# Patient Record
Sex: Female | Born: 1975 | Race: Black or African American | Hispanic: No | Marital: Married | Smoking: Current every day smoker
Health system: Southern US, Community
[De-identification: ages and names within clinical notes are randomized; demographics above are authoritative.]

## PROBLEM LIST (undated history)

## (undated) DIAGNOSIS — M797 Fibromyalgia: Secondary | ICD-10-CM

## (undated) DIAGNOSIS — F319 Bipolar disorder, unspecified: Secondary | ICD-10-CM

## (undated) DIAGNOSIS — M5430 Sciatica, unspecified side: Secondary | ICD-10-CM

## (undated) DIAGNOSIS — Z8739 Personal history of other diseases of the musculoskeletal system and connective tissue: Secondary | ICD-10-CM

## (undated) DIAGNOSIS — M1990 Unspecified osteoarthritis, unspecified site: Secondary | ICD-10-CM

## (undated) DIAGNOSIS — K219 Gastro-esophageal reflux disease without esophagitis: Secondary | ICD-10-CM

## (undated) DIAGNOSIS — D649 Anemia, unspecified: Secondary | ICD-10-CM

## (undated) DIAGNOSIS — I10 Essential (primary) hypertension: Secondary | ICD-10-CM

## (undated) HISTORY — PX: TUBAL LIGATION: SHX77

## (undated) HISTORY — PX: ANKLE SURGERY: SHX546

## (undated) HISTORY — DX: Sciatica, unspecified side: M54.30

## (undated) HISTORY — PX: BACK SURGERY: SHX140

---
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
## 1996-01-22 HISTORY — PX: CHOLECYSTECTOMY: SHX55

## 2000-05-29 ENCOUNTER — Emergency Department (HOSPITAL_COMMUNITY): Admission: EM | Admit: 2000-05-29 | Discharge: 2000-05-29 | Payer: Self-pay

## 2000-06-29 ENCOUNTER — Emergency Department (HOSPITAL_COMMUNITY): Admission: EM | Admit: 2000-06-29 | Discharge: 2000-06-29 | Payer: Self-pay | Admitting: *Deleted

## 2000-10-29 ENCOUNTER — Emergency Department (HOSPITAL_COMMUNITY): Admission: EM | Admit: 2000-10-29 | Discharge: 2000-10-29 | Payer: Self-pay | Admitting: Emergency Medicine

## 2001-04-19 ENCOUNTER — Emergency Department (HOSPITAL_COMMUNITY): Admission: EM | Admit: 2001-04-19 | Discharge: 2001-04-19 | Payer: Self-pay | Admitting: Emergency Medicine

## 2001-04-27 ENCOUNTER — Emergency Department (HOSPITAL_COMMUNITY): Admission: EM | Admit: 2001-04-27 | Discharge: 2001-04-27 | Payer: Self-pay | Admitting: Emergency Medicine

## 2001-04-27 ENCOUNTER — Encounter: Payer: Self-pay | Admitting: Emergency Medicine

## 2001-04-28 ENCOUNTER — Ambulatory Visit (HOSPITAL_COMMUNITY): Admission: RE | Admit: 2001-04-28 | Discharge: 2001-04-28 | Payer: Self-pay | Admitting: General Surgery

## 2001-04-28 ENCOUNTER — Encounter: Payer: Self-pay | Admitting: Emergency Medicine

## 2001-04-30 ENCOUNTER — Emergency Department (HOSPITAL_COMMUNITY): Admission: EM | Admit: 2001-04-30 | Discharge: 2001-04-30 | Payer: Self-pay | Admitting: Emergency Medicine

## 2001-05-02 ENCOUNTER — Emergency Department (HOSPITAL_COMMUNITY): Admission: EM | Admit: 2001-05-02 | Discharge: 2001-05-02 | Payer: Self-pay | Admitting: Emergency Medicine

## 2001-05-02 ENCOUNTER — Encounter: Payer: Self-pay | Admitting: Emergency Medicine

## 2001-05-06 ENCOUNTER — Emergency Department (HOSPITAL_COMMUNITY): Admission: EM | Admit: 2001-05-06 | Discharge: 2001-05-06 | Payer: Self-pay | Admitting: Emergency Medicine

## 2001-05-08 ENCOUNTER — Encounter: Payer: Self-pay | Admitting: Emergency Medicine

## 2001-05-08 ENCOUNTER — Emergency Department (HOSPITAL_COMMUNITY): Admission: EM | Admit: 2001-05-08 | Discharge: 2001-05-08 | Payer: Self-pay | Admitting: Emergency Medicine

## 2001-09-22 ENCOUNTER — Emergency Department (HOSPITAL_COMMUNITY): Admission: EM | Admit: 2001-09-22 | Discharge: 2001-09-22 | Payer: Self-pay | Admitting: *Deleted

## 2002-03-28 ENCOUNTER — Encounter: Payer: Self-pay | Admitting: *Deleted

## 2002-03-28 ENCOUNTER — Emergency Department (HOSPITAL_COMMUNITY): Admission: EM | Admit: 2002-03-28 | Discharge: 2002-03-28 | Payer: Self-pay | Admitting: Emergency Medicine

## 2002-05-14 ENCOUNTER — Emergency Department (HOSPITAL_COMMUNITY): Admission: EM | Admit: 2002-05-14 | Discharge: 2002-05-14 | Payer: Self-pay | Admitting: Emergency Medicine

## 2002-05-27 ENCOUNTER — Encounter (HOSPITAL_COMMUNITY): Admission: RE | Admit: 2002-05-27 | Discharge: 2002-06-26 | Payer: Self-pay | Admitting: Orthopedic Surgery

## 2002-10-12 ENCOUNTER — Encounter: Admission: RE | Admit: 2002-10-12 | Discharge: 2002-10-12 | Payer: Self-pay | Admitting: Internal Medicine

## 2002-12-09 ENCOUNTER — Emergency Department (HOSPITAL_COMMUNITY): Admission: EM | Admit: 2002-12-09 | Discharge: 2002-12-09 | Payer: Self-pay | Admitting: Emergency Medicine

## 2003-01-07 ENCOUNTER — Encounter: Admission: RE | Admit: 2003-01-07 | Discharge: 2003-01-07 | Payer: Self-pay | Admitting: Internal Medicine

## 2003-01-22 HISTORY — PX: ERCP: SHX60

## 2003-01-29 ENCOUNTER — Emergency Department (HOSPITAL_COMMUNITY): Admission: EM | Admit: 2003-01-29 | Discharge: 2003-01-29 | Payer: Self-pay | Admitting: Emergency Medicine

## 2003-02-11 ENCOUNTER — Emergency Department (HOSPITAL_COMMUNITY): Admission: EM | Admit: 2003-02-11 | Discharge: 2003-02-11 | Payer: Self-pay | Admitting: Emergency Medicine

## 2003-02-12 ENCOUNTER — Emergency Department (HOSPITAL_COMMUNITY): Admission: EM | Admit: 2003-02-12 | Discharge: 2003-02-12 | Payer: Self-pay | Admitting: Emergency Medicine

## 2003-02-13 ENCOUNTER — Observation Stay (HOSPITAL_COMMUNITY): Admission: EM | Admit: 2003-02-13 | Discharge: 2003-02-13 | Payer: Self-pay | Admitting: Emergency Medicine

## 2003-02-15 ENCOUNTER — Encounter: Admission: RE | Admit: 2003-02-15 | Discharge: 2003-02-15 | Payer: Self-pay | Admitting: Internal Medicine

## 2003-02-18 ENCOUNTER — Encounter: Admission: RE | Admit: 2003-02-18 | Discharge: 2003-02-18 | Payer: Self-pay | Admitting: Internal Medicine

## 2003-03-01 ENCOUNTER — Encounter: Admission: RE | Admit: 2003-03-01 | Discharge: 2003-03-01 | Payer: Self-pay | Admitting: Internal Medicine

## 2003-03-09 ENCOUNTER — Emergency Department (HOSPITAL_COMMUNITY): Admission: EM | Admit: 2003-03-09 | Discharge: 2003-03-09 | Payer: Self-pay | Admitting: Emergency Medicine

## 2003-03-11 ENCOUNTER — Emergency Department (HOSPITAL_COMMUNITY): Admission: EM | Admit: 2003-03-11 | Discharge: 2003-03-11 | Payer: Self-pay | Admitting: Emergency Medicine

## 2003-03-11 ENCOUNTER — Inpatient Hospital Stay (HOSPITAL_COMMUNITY): Admission: EM | Admit: 2003-03-11 | Discharge: 2003-03-13 | Payer: Self-pay | Admitting: Emergency Medicine

## 2003-06-09 ENCOUNTER — Other Ambulatory Visit: Admission: RE | Admit: 2003-06-09 | Discharge: 2003-06-23 | Payer: Self-pay | Admitting: Internal Medicine

## 2003-06-09 ENCOUNTER — Encounter: Admission: RE | Admit: 2003-06-09 | Discharge: 2003-06-09 | Payer: Self-pay | Admitting: Internal Medicine

## 2003-07-09 ENCOUNTER — Emergency Department (HOSPITAL_COMMUNITY): Admission: EM | Admit: 2003-07-09 | Discharge: 2003-07-09 | Payer: Self-pay | Admitting: Family Medicine

## 2003-08-31 ENCOUNTER — Encounter: Admission: RE | Admit: 2003-08-31 | Discharge: 2003-08-31 | Payer: Self-pay | Admitting: Internal Medicine

## 2004-01-13 ENCOUNTER — Emergency Department: Payer: Self-pay | Admitting: Emergency Medicine

## 2004-03-29 ENCOUNTER — Emergency Department: Payer: Self-pay | Admitting: Emergency Medicine

## 2004-04-25 ENCOUNTER — Emergency Department: Payer: Self-pay | Admitting: Emergency Medicine

## 2004-05-17 ENCOUNTER — Inpatient Hospital Stay: Payer: Self-pay | Admitting: Internal Medicine

## 2004-06-29 ENCOUNTER — Emergency Department: Payer: Self-pay | Admitting: General Practice

## 2004-07-05 ENCOUNTER — Emergency Department: Payer: Self-pay | Admitting: Emergency Medicine

## 2004-10-28 ENCOUNTER — Emergency Department: Payer: Self-pay | Admitting: Internal Medicine

## 2005-02-18 ENCOUNTER — Ambulatory Visit: Payer: Self-pay | Admitting: Nurse Practitioner

## 2005-04-01 ENCOUNTER — Emergency Department: Payer: Self-pay | Admitting: Emergency Medicine

## 2005-04-11 ENCOUNTER — Ambulatory Visit: Payer: Self-pay | Admitting: Pain Medicine

## 2005-04-16 ENCOUNTER — Ambulatory Visit: Payer: Self-pay | Admitting: Pain Medicine

## 2005-04-18 ENCOUNTER — Ambulatory Visit: Payer: Self-pay | Admitting: Psychiatry

## 2005-04-19 ENCOUNTER — Inpatient Hospital Stay (HOSPITAL_COMMUNITY): Admission: EM | Admit: 2005-04-19 | Discharge: 2005-04-22 | Payer: Self-pay | Admitting: Psychiatry

## 2005-04-21 ENCOUNTER — Ambulatory Visit: Payer: Self-pay | Admitting: Psychiatry

## 2005-05-02 ENCOUNTER — Ambulatory Visit: Payer: Self-pay | Admitting: Pain Medicine

## 2005-05-12 ENCOUNTER — Emergency Department: Payer: Self-pay | Admitting: Internal Medicine

## 2005-05-13 ENCOUNTER — Emergency Department: Payer: Self-pay | Admitting: Emergency Medicine

## 2005-05-13 ENCOUNTER — Other Ambulatory Visit: Payer: Self-pay

## 2005-05-16 ENCOUNTER — Ambulatory Visit: Payer: Self-pay | Admitting: Pain Medicine

## 2005-05-20 ENCOUNTER — Emergency Department: Payer: Self-pay | Admitting: Unknown Physician Specialty

## 2005-06-02 ENCOUNTER — Emergency Department: Payer: Self-pay | Admitting: Emergency Medicine

## 2005-06-05 ENCOUNTER — Emergency Department: Payer: Self-pay | Admitting: Emergency Medicine

## 2005-09-03 ENCOUNTER — Emergency Department: Payer: Self-pay | Admitting: Emergency Medicine

## 2005-09-04 ENCOUNTER — Emergency Department: Payer: Self-pay | Admitting: Emergency Medicine

## 2005-09-05 ENCOUNTER — Inpatient Hospital Stay (HOSPITAL_COMMUNITY): Admission: EM | Admit: 2005-09-05 | Discharge: 2005-09-09 | Payer: Self-pay | Admitting: *Deleted

## 2005-09-05 ENCOUNTER — Ambulatory Visit: Payer: Self-pay | Admitting: *Deleted

## 2005-11-05 ENCOUNTER — Emergency Department: Payer: Self-pay | Admitting: General Practice

## 2006-01-17 ENCOUNTER — Emergency Department: Payer: Self-pay | Admitting: Emergency Medicine

## 2006-01-19 ENCOUNTER — Other Ambulatory Visit: Payer: Self-pay

## 2006-01-20 ENCOUNTER — Inpatient Hospital Stay: Payer: Self-pay | Admitting: Internal Medicine

## 2006-01-21 ENCOUNTER — Inpatient Hospital Stay: Payer: Self-pay | Admitting: Unknown Physician Specialty

## 2006-01-29 ENCOUNTER — Emergency Department: Payer: Self-pay | Admitting: Unknown Physician Specialty

## 2006-05-27 ENCOUNTER — Emergency Department: Payer: Self-pay | Admitting: Emergency Medicine

## 2006-07-30 ENCOUNTER — Emergency Department: Payer: Self-pay | Admitting: Emergency Medicine

## 2006-10-03 ENCOUNTER — Emergency Department: Payer: Self-pay | Admitting: Emergency Medicine

## 2006-10-03 ENCOUNTER — Other Ambulatory Visit: Payer: Self-pay

## 2006-10-06 ENCOUNTER — Emergency Department: Payer: Self-pay | Admitting: Emergency Medicine

## 2007-03-17 ENCOUNTER — Emergency Department (HOSPITAL_COMMUNITY): Admission: EM | Admit: 2007-03-17 | Discharge: 2007-03-17 | Payer: Self-pay | Admitting: Emergency Medicine

## 2007-05-03 ENCOUNTER — Emergency Department: Payer: Self-pay | Admitting: Internal Medicine

## 2007-06-01 ENCOUNTER — Ambulatory Visit: Payer: Self-pay | Admitting: Gastroenterology

## 2007-07-21 ENCOUNTER — Emergency Department: Payer: Self-pay | Admitting: Internal Medicine

## 2007-12-29 ENCOUNTER — Emergency Department: Payer: Self-pay | Admitting: Internal Medicine

## 2008-01-18 ENCOUNTER — Emergency Department (HOSPITAL_COMMUNITY): Admission: EM | Admit: 2008-01-18 | Discharge: 2008-01-18 | Payer: Self-pay | Admitting: Emergency Medicine

## 2008-02-25 ENCOUNTER — Ambulatory Visit: Payer: Self-pay | Admitting: Unknown Physician Specialty

## 2008-03-04 ENCOUNTER — Ambulatory Visit: Payer: Self-pay | Admitting: Unknown Physician Specialty

## 2008-03-08 ENCOUNTER — Inpatient Hospital Stay: Payer: Self-pay | Admitting: Unknown Physician Specialty

## 2008-07-02 ENCOUNTER — Emergency Department: Payer: Self-pay | Admitting: Emergency Medicine

## 2008-07-04 ENCOUNTER — Emergency Department: Payer: Self-pay | Admitting: Emergency Medicine

## 2008-07-21 ENCOUNTER — Inpatient Hospital Stay: Payer: Self-pay | Admitting: Psychiatry

## 2008-08-14 ENCOUNTER — Emergency Department: Payer: Self-pay

## 2008-08-26 ENCOUNTER — Inpatient Hospital Stay: Payer: Self-pay | Admitting: Psychiatry

## 2008-09-05 ENCOUNTER — Inpatient Hospital Stay: Payer: Self-pay | Admitting: Unknown Physician Specialty

## 2009-06-03 ENCOUNTER — Inpatient Hospital Stay: Payer: Self-pay | Admitting: Psychiatry

## 2010-03-14 ENCOUNTER — Emergency Department: Payer: Self-pay | Admitting: Emergency Medicine

## 2010-03-16 ENCOUNTER — Inpatient Hospital Stay: Payer: Self-pay | Admitting: Psychiatry

## 2010-04-08 ENCOUNTER — Inpatient Hospital Stay: Payer: Self-pay | Admitting: Unknown Physician Specialty

## 2010-06-08 NOTE — Discharge Summary (Signed)
[name], [name]               ACCOUNT NO.:  [id number]   MEDICAL RECORD NO.:  [id number]          PATIENT TYPE:  IPS   LOCATION:  0307                          FACILITY:  [facility]   PHYSICIAN:  Anselm Jungling, MD  DATE OF BIRTH:  05/16/1975   DATE OF ADMISSION:  04/19/2005  DATE OF DISCHARGE:  04/22/2005                                 DISCHARGE SUMMARY   IDENTIFYING DATA AND REASON FOR ADMISSION:  This was the first [facility] admission  for [name] [name], [name] 35 year old African-American [name] admitted for  detoxification of polysubstance dependence.  She had become more and more  depressed due to the consequences of her addiction and presented to an  emergency room requesting hospital admission, reporting need for  detoxification as well as suicidal ideation with [name] plan to overdose.  The  patient had [name] history of chronic back pain due to degenerative disk disease,  and had been taking increasing amounts of narcotic analgesics.  Please refer  to the admission note for further details pertaining to the symptoms,  circumstances and history that led to her hospitalization.  She was given an  initial Axis I diagnosis of polysubstance dependence, substance-induced mood  disorder, and pain disorder.   MEDICAL AND LABORATORY:  The patient was medically and physically assessed  by the psychiatric nurse practitioner at the time of admission.  In addition  to her back pain, she came to [place] with [name] history of GERD and asthma.  She was  prescribed Pepcid 20 mg b.i.d. and albuterol inhaler for these respectively.  She was diagnosed with [name] urinary tract infection during her course of  inpatient treatment and placed on [name] course of Cipro 250 mg b.i.d.   To address pain, she was given lidocaine patches to her lower back daily and  Neurontin 300 mg q.i.d. was initiated to address chronic pain.  Aside from  this, there were no significant medical issues.   HOSPITAL COURSE:  The patient was admitted to the adult  inpatient  psychiatric service.  She presented as [name] moderately obese, but normally  developed healthy-appearing adult [name].  She initially indicated that she  was miserable due to back pain and symptoms of opiate withdrawal.  She was  alert, fully oriented, nonpsychotic, and her thoughts and speech were well  organized.  She did want to continue with the detoxification process in  spite of her physical discomfort.   She was ordered Librium and clonidine withdrawal protocols.  In addition,  she was begun on Effexor XR 75 mg daily to address depression.  Risperdal  0.5 mg q.h.s. was added to her regimen to address sleep and general anxiety.  Ambien 10 mg q.h.s. was also utilized for sleep.   The patient experienced significant opiate withdrawal symptoms over the next  few days, but these improved on [name] daily basis, and she became more positive,  hopeful and optimistic about her future as she felt herself completing this  process.   By the fourth hospital day, the patient indicated that she felt that she was  finished with the detoxification process.  She  was tolerating Effexor well.  She indicated that she looked forward to returning to her life without  dependence on opiate analgesics or other substances.  She discussed with the  undersigned the possibility of locating narcotics anonymous groups in her  home area to begin attending immediately.  She was absent suicidal ideation.  Her mood was significantly brighter at the time of discharge.   AFTERCARE:  The patient was to follow-up with [facility].  Our case manager was to call the patient with follow-up appointments  following her actual discharge.   DISCHARGE MEDICATIONS:  Clonidine 0.1 mg b.i.d., Effexor XR 75 mg daily,  Neurontin 300 mg q.i.d., Risperdal 0.5 mg q.h.s., Ambien 10 mg q.h.s., Cipro  250 mg b.i.d. for 3 days more, Pepcid 20 mg b.i.d., lidocaine patch as daily  to lower back, Nicoderm 21 patch  daily to arm, and albuterol inhaler p.r.n.  wheezing.   DISCHARGE DIAGNOSES:  AXIS I:  1.  Polysubstance dependence, early remission.  2.  Substance-induced mood disorder.  AXIS II:  Deferred.  AXIS III:  History of GERD, urinary tract infection, and chronic lower back  pain.  AXIS IV:  Stressors severe.  AXIS V:  GAF on discharge 65.           ______________________________  Anselm Jungling, MD  Electronically Signed     SPB/MEDQ  D:  04/23/2005  T:  04/24/2005  Job:  [id number]

## 2010-06-08 NOTE — Op Note (Signed)
[name], [name]                       ACCOUNT NO.:  [id number]   MEDICAL RECORD NO.:  [id number]                   PATIENT TYPE:  INP   LOCATION:  A323                                 FACILITY:  [facility]   PHYSICIAN:  Lionel December, M.D.                 DATE OF BIRTH:  Mar 07, 1975   DATE OF PROCEDURE:  03/12/2003  DATE OF DISCHARGE:                                 OPERATIVE REPORT   PROCEDURE:  Endoscopic retrograde cholangiopancreatography with endoscopic  sphincterotomy.   INDICATIONS FOR PROCEDURE:  Anyla is a 35 year old African-American female  with multiple medical problems who has been having intermittent pain across  her upper abdomen with nausea and vomiting. She has been seen in the  emergency room multiple times.  She has also been hospitalized a few weeks  ago thought to have viral illness.  On this visit, she had upper abdominal  ultrasound in the emergency room which revealed mildly dilated bile duct  with defect consistent with stones.  I reviewed the ultrasound and agreed  with this impression. The patient was hospitalized and now undergoing  therapeutic procedure.  The procedure was reviewed with the patient and  informed consent was obtained.   PREOP MEDICATIONS:  Please see anesthesia records.   FINDINGS:  Procedure performed in the OR.  After the patient was placed  under anesthesia and intubated, she was positioned in semiprone position.  The Olympus video duodenoscope was passed through the oropharynx into the  esophagus and stomach.  Examination of the gastric mucosa revealed changes  of gastritis and multiple petechiae.  No ulcer crater was noted. The pyloric  channel was patent, the scope was passed across into the bulb and descending  duodenum.  The ampulla of Vater appeared to be normal.  The CBD was easily  cannulated with RX 44 Autotome and Hydra Jagwire.  Dilute contrast was  injected.  The CBD and CHD were dilated.  It was maximally dilated at  the  level of hepatic duct measuring about 10 mm.  Intrahepatic biliary radicles  were prominent; however, no filling defects were noted. It was difficult to  see the lower most segment of CBD.  Sphincterotomy was performed with  adequate flow of contrast bile.  There was sludge that came out.  Balloon  stone extractor was passed deep into the bile duct to the level of  bifurcation, inflated and trolled through the duct a couple times through a  diameter about 9-10 mm.  I was able to pass this balloon across the  sphincterotomy without any difficulty.  No stones were pulled. The endoscope  was withdrawn.  The patient tolerated the procedure well.   FINAL DIAGNOSES:  1. Gastritis.  2. Normal ampulla of Vater.  3. Dilated common bile duct and common hepatic duct, however, no definite     stones noted.  Sphincterotomy performed with removal of some     sludge/debris.  RECOMMENDATIONS:  Clear liquids today. She will have liver function tests  and amylase in the a.m.  H. pylori serology will be checked.      ___________________________________________                                            Lionel December, M.D.   NR/MEDQ  D:  03/12/2003  T:  03/12/2003  Job:  [id number]
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
## 2010-06-08 NOTE — H&P (Signed)
[name], [name]                       ACCOUNT NO.:  [id number]   MEDICAL RECORD NO.:  [id number]                   PATIENT TYPE:  INP   LOCATION:  A323                                 FACILITY:  [facility]   PHYSICIAN:  Lionel December, M.D.                 DATE OF BIRTH:  [birth year]   DATE OF ADMISSION:  03/11/2003  DATE OF DISCHARGE:                                HISTORY & PHYSICAL   PRESENTING COMPLAINT:  1. Upper abdominal pain, nausea, vomiting.  2. Ultrasound revealed mildly-dilated biliary system with stones.   HISTORY OF PRESENT ILLNESS:  [name] is a 35 year old African American female  whose primary care physician is Dr. _______ in [place] who has been  having intermittent upper abdominal pain with nausea, vomiting, fever, and  profuse diaphoresis.  The patient was in the emergency room two weeks ago,  and last week, three nights in a row.  Her labs appeared to be normal.  She  was experiencing another bout of sever pain across her upper abdomen with  nausea, vomiting and therefore came back to the emergency room.  She was  seen by Dr. Hilario Quarry.  Her lab studies were normal.  Ultrasound was  obtained which showed a common bile duct of 7.5 mm with prominent  intrahepatic system as well as stones in the bile duct.  The patient is  therefore admitted for further management of this problem.  The patient  denies hematemesis, melena or rectal bleeding.  She has had intermittent  diarrhea.  She thought that she had the flu.  She saw the ER physician last  week.  She has not lost any weight recently.   MEDICATIONS:  She is on multiple medications which include Paxil 10 mg  q.h.s., HCTZ.  She is on other antihypertensives, anxiolytics and other  medications which she does not remember the name.  Her boyfriend is supposed  to bring the medications for review.   PAST MEDICAL HISTORY:  1. She has umbilical surgery about 10 years ago at [facility].  She has     purulent  drainage at the time.  She now has noted a recurrence.  She     wonders if her heaving and retching has induced this.  She had surgery on     her left ankle.  She had cholecystectomies seven years ago by Dr. Marlene Bast     at [facility].  She has had tubal ligation.  She has genital herpes.  She has     anxiety, neurosis and depression.  She is under the care of Dr.     ___________ in [place].  2. Obesity.   ALLERGIES:  None Known.   FAMILY HISTORY:  Mother has rheumatoid arthritis and diabetes mellitus, not  doing well.  Father is in good health.  She has one brother who has been in  jail for 10 years.   SOCIAL HISTORY:  She is single.  She has two children, ages 54 and 56.  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  She does not smoke cigarettes or drink alcohol.   PHYSICAL EXAMINATION:  GENERAL:  She is a pleasant, obese, African American  female who is in no acute distress.  VITAL SIGNS:  She weighs 217.5 pounds. She is 5 feet tall.  Pulse 85, blood  pressure 144/95, respirations 20, temperature 99.1.  HEENT:  Conjunctivae is pink.  Sclerae is nonicteric.  Oropharyngeal mucosa  is normal.  NECK:  Without masses or thyromegaly.  CARDIAC:  Regular rhythm, normal S1, S2.  No murmur or gallop noted.  LUNGS:  Clear to auscultation.  ABDOMEN:  Obese.  Bowel sounds are normal.  Umbilicus is inverted. There is  no drainage noted at this time.  Skin appears to be erythematous.  Palpation  reveals soft abdomen, mild tenderness in the mid epigastric area.  No  organomegaly or mass.  RECTAL:  Exam is deferred.  No clubbing or peripheral edema noted.   LABORATORY DATA:  Done in the ER, WBC 9.5, H&H 14.1 and 42.2, platelet count  334,000.  Sodium 138, potassium 3.0, chloride 108, CO2 23.  Glucose 111, BUN  6, creatinine 0.7, calcium 8.8.  Urinalysis reveals a specific gravity of  greater than 1.030.  Her pH is 6.5.  She has protein 30 mg per dl.  Leukocyte  esterase is negative and nitrites are also negative.  Bilirubin is  0.9, AP 61, AST 19, ALT 19.  Total protein 6.6 with albumin of 3.6.   Ultrasound:  I reviewed the ultrasound with Dr. Frazier Richards.  Common bile duct  is mildly dilated, intrahepatic radicals are prominent.  There appear to be  at least two or three stones in the common bile duct.   ASSESSMENT:  1. [name] is a 35 year old African American female with multiple medical     problems who has been experience recurrence of abdominal pain with     nausea, vomiting, diaphoresis, fever, who was found to have     choledocholithiasis on ultrasonography.  Her liver function tests are     normal.  Not mentioned above, her amylase and lipase are also normal at     88 and 25.  She needs to have endoscopic retrograde     cholangiopancreatography, sphincterotomy and stone extraction.  2. Given the patient's underlying problems and the fact that she is on     anxiolytic and antidepressant, I do not feel this would be possible with     conscious sedation.  She will need the help of anesthesia either with     propofol or general anesthesia.  3. Hypokalemia which will be corrected with oral KCl.   PLAN:  1. Start on Unasyn 1.5 grams IV q.6.  2. She will be given KCl p.o. tonight.  3. Protonix 40 mg IV q.24h.  4. She will undergo endoscopic retrograde cholangiopancreatography with     sphincterotomy and stone extraction under general anesthesia or propofol     as determined to be appropriate by anesthesia team.  5. PT, PTT, bleeding time will be checked in the a.m., and she will also be     typed and held for two units.  6. We will resume her usual medications as soon as the specifics are known.  7. I have reviewed ERCP in detail with the patient along with the risk.  She  is agreeable.     ___________________________________________                                        Lionel December, M.D.   NR/MEDQ  D:  03/11/2003  T:  03/11/2003   Job:  [id number]   cc:   Shosry (?), M.D.

## 2010-06-08 NOTE — H&P (Signed)
[name], [name]                       ACCOUNT NO.:  [id number]   MEDICAL RECORD NO.:  [id number]                   PATIENT TYPE:  INP   LOCATION:  A207                                 FACILITY:  [facility]   PHYSICIAN:  Vania Rea, M.D.              DATE OF BIRTH:  1975/12/02   DATE OF ADMISSION:  02/13/2003  DATE OF DISCHARGE:                                HISTORY & PHYSICAL   PRIMARY CARE PHYSICIAN:  __________   CHIEF COMPLAINT:  Nausea, vomiting, diarrhea intractable for three days.   HISTORY OF PRESENT ILLNESS:  This is a 35 year old African American lady  status post cholecystectomy at age 55.  History of gastroesophageal reflux  disease, who has been having vomitus of yellow-green material for the past  three days, unrelieved by p.o. Phenergan, also associated with diarrhea of  similar green material for the past three days.  Today is the patient's  third visit to the emergency room.  The diarrhea is associated with  cramping, colicky, periumbilical pain which then becomes diffuse.  The  patient has had four episodes of vomiting today and three episodes of  diarrhea.  At one point, she was incontinent from the diarrhea.   The patient also suffers from depression, and has not been taking her Paxil.  She is complaining of insomnia.  The patient is in distress because she says  she is all alone in her sickness.  The patient became very upset and wants  to leave the emergency room because she says whether she is here or she is  home, she is not getting any help.   The patient smokes one pack a day for many years.  She denies alcohol use,  but does use marijuana occasionally.  She is concerned it may be the  marijuana use that is causing her symptoms.  No fever, cough, or cold.  No  urinary problems.  No headaches.   The patient gives a history of antibiotic use within the past three months.  She is unclear exactly when, and she is unclear exactly why.   PAST  MEDICAL HISTORY:  1. Status post cholecystectomy at age 45.  2. Bilateral tubal ligation.  3. History of abdominal surgery for periumbilical draining at age 83.  4. Gastroesophageal reflux disease.  5. Depression.  6. Recurrent nausea and vomiting.   MEDICATIONS:  1. ACTZ 25 mg daily.  2. Paxil daily.  3. Phenergan for the past two to three days.  4. The patient has been getting Dilaudid and Zofran in the emergency room.   ALLERGIES:  No known drug allergies.   SOCIAL/FAMILY HISTORY:  She lives alone with two children.  Her mother has  diabetes and hypertension.  She has no peripheral neuropathy or rheumatoid  arthritis.   REVIEW OF SYSTEMS:  Not further contributory.   PHYSICAL EXAMINATION:  GENERAL:  An obese, young African American lady,  sitting on the stretcher, tearful.  Her psychological reaction seems out of  proportion to what is happening around her.  She says the emergency room  people are laughing at her because she has been in three times and do not  seem to be interested in looking after her.  She has been in the emergency  room for seven hours, she says, and her problems are not as important as  others.  VITAL SIGNS:  Initially are 97.2, blood pressure 131/99, pulse 84,  respirations 20, saturating at 100% on room air.  Later when she became  agitated, her pressure went to 160/110.  HEENT:  She is pink.  She is anicteric. Mucous membranes are pale and moist.  In fact, she is tearful.  There is no lymphadenopathy.  There is no jugular  venous distension.  CHEST:  Clear to auscultation bilaterally.  ABDOMEN:  Obese, soft and pliant, but she is tender particular around the  navel.  There is no epigastric tenderness.  CARDIOVASCULAR:  Regular rhythm, no murmurs.  EXTREMITIES:  No edema.  NEUROLOGIC:  CNS:  Depressed appearance, alert and oriented x3, no focal  deficits.   LABORATORY DATA:  On laboratory investigation, her white count is 6.8,  hemoglobin 15.4,  hematocrit 46.6, MCV is 8.1.  RDW 14, platelet count 392,  chest 66% neutrophils, total protein of 7, albumin is 3.9.  AST 19, ALT 18.  Liver function tests are otherwise completely normal.  Sodium 138, potassium  3.6, chloride 106, CO2 25, glucose 105, BUN 7, creatinine 0.7, calcium 9.7.   ASSESSMENT:  1. Intractable nausea and vomiting.  2. Depression.  3. Tobacco abuse.   PLAN:  1. We will admit he for IV fluid resuscitation and a graduated diet starting     with a clear liquid diet.  We probably will not start Cipro.  She does     not have a white count, but we will start Flagyl, and we will work up a     stool for an infectious cause of her diarrhea.  2. In view of the fact that this is a recurrent problem, and that she     defaulted from followup with her gastroenterologist, we will get a GI     consultation also.     ___________________________________________                                         Vania Rea, M.D.   LC/MEDQ  D:  02/13/2003  T:  02/13/2003  Job:  [id number]

## 2010-06-08 NOTE — Discharge Summary (Signed)
[name], [name]                       ACCOUNT NO.:  [id number]   MEDICAL RECORD NO.:  [id number]                   PATIENT TYPE:  INP   LOCATION:  A323                                 FACILITY:  [facility]   PHYSICIAN:  Lionel December, M.D.                 DATE OF BIRTH:  03/24/1975   DATE OF ADMISSION:  03/11/2003  DATE OF DISCHARGE:  03/13/2003                                 DISCHARGE SUMMARY   PRINCIPAL DIAGNOSIS:  1. 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  2. Hyperkalemia.  3. Possible irritable bowel syndrome.  4. Gastroesophageal reflux disease.  5. Anxiety neurosis.  6. Depression.  7. Hypertension.   DISCHARGE MEDICATIONS:  1. Atenolol 25 mg p.o. q.d.  2. Lorazepam 0.5 mg b.i.d.  3. Paxil 20 mg q.d.  4. Dicyclomine 10-20 mg for each meal.  5. Omeprazole 20 mg p.o. q.d.  6. K-Dur 20 mEq q.d.  7. HCTZ 25 mg q.d. with breakfast.  8. Zofran 4 mg p.o. b.i.d.   PRINCIPAL PROCEDURE:  ERCP with sphincterotomy by Dr. Karilyn Cota on March 12, 2003.   HISTORY OF PRESENT ILLNESS:  [name] is a 35 year old African-American female  with multiple medical problems, who came to the emergency room for the  fourth and fifth time on March 11, 2003 with pain across her upper  abdomen with nausea and vomiting.  She was evaluated by Dr. Margarita Grizzle.  Her LFTs, amylase, and lipase were normal.  She had an upper abdominal  ultrasound which revealed mildly dilated bile duct with filling defects  consistent with stones.  She was therefore admitted to my service.  I  reviewed the study with Dr. Frazier Richards and Dr. Pia Mau, and they agreed that  this abnormality was suspicious for small stones.  Patient was therefore  counseled regarding ERCP with sphincterotomy.  She is agreeable.  On  admission, her serum potassium was low and was corrected with oral KCL.  Patient underwent ERCP with sphincterotomy and balloon passage for  bile duct  under general anesthesia.  She has some debris that came out, but there were  no stones.  Post procedure, the patient noted resolution of her pain.  Her  transaminase and alkaline phosphatase, as her last amylase, remained normal.  She was able to tolerate her diet, although she complained of cramps and  nonbloody diarrhea.  She also has a history of GERD.  She had previously  been on Protonix.  It was therefore decided to send her home on a PPI.  Since no stones were found, I felt that she could have sphincter of Oddi  dysfunction.  Her CHD was dilated and felt to be 10 mm, which was not very  big, considering she has had previous cholecystectomy.  The patient also  reported drainage from her umbilicus.  She did not have any discharge when  I  saw her this morning.  She apparently had some surgery 10 years ago at [facility].  I suggested that she have her primary care physician refer to  a surgeon to see whether or not she needs to have debridement or have her  umbilicus, etc., removed.  The patient was feeling much better prior to  discharge.  She was given a prescription for dicyclomine 10 mg #100,  omeprazole 20 mg #30 with two refills, K-Dur 20 mEq 30 with refills, and  Zofran 4 mg 10 doses.  She was asked to follow up with Dr. Prescott Parma.  She is  in [place].  She should have her potassium checked.  She was also given a  note stating that she was in the hospital between February 18 and March 13, 2003.     ___________________________________________                                         Lionel December, M.D.   NR/MEDQ  D:  03/13/2003  T:  03/13/2003  Job:  [id number]   cc:   Dr. Louanne Skye outpatient clinic

## 2010-06-08 NOTE — Consult Note (Signed)
[facility]  Patient:    GAVYN, ZOSS Visit Number: [id number] MRN: [id number]          Service Type: EMS Location: ED Attending Physician:  Donnetta Hutching Dictated by:   Shellia Carwin, M.D. Proc. Date: 04/30/01 Admit Date:  05/08/2001 Discharge Date: 05/08/2001   CC:         Dr. Rosalia Hammers   Consultation Report  REFERRING PHYSICIAN:  Dr. Rosalia Hammers.  HISTORY OF PRESENT ILLNESS:  The patient is a 35 year old black female who has been seen twice in the emergency room this week. Her complaints of nausea and vomiting. The patient has had these symptoms for approximately six months. She has been followed by Dr. Francis Gaines in [place]. She apparently has had an endoscopy last month by Dr. Francis Gaines.  She was told that she had a hiatal hernia. She has been started on twice daily Protonix. She has also been told that she has reflux. The patient reports that since 6 oclock this morning she has had nausea and vomiting of yellow bilious-appearing fluid. She also states that vomiting usually starts right after a meal. She has also reported some diarrhea with soft stools with no blood or mucus in them and no black tarry stools reported. She has had no hematemesis and no coffee-ground emesis. She has had some subjective fever and chills at home. She denies any dysuria or hematuria. She reports perhaps a weight loss of about 10 pounds and she denies any vaginal discharge. She has been recently diagnosed with Trichomonas although states that she has not had any symptoms of this. Her last menstrual period was in the first week of April. The patient has no family history of any type of gastrointestinal problems.  PAST MEDICAL HISTORY:  Cholecystectomy at age 85, BTL, and an unknown type of abdominal surgery at [facility] at the age of 70.  CURRENT MEDICATIONS:  Protonix twice daily.  ALLERGIES:  None.  SOCIAL HISTORY:  The patient smokes one pack tobacco daily. She  denies any alcohol use. She reports some occasional marijuana use but no other recreational drug use is reported and no IV drug use is reported.  FAMILY HISTORY:  Her mother has diabetes, hypertension, asthma, peripheral neuropathy, and rheumatoid arthritis.  REVIEW OF SYSTEMS:  No headaches or visual complaints. Occasional dysphagia. Nausea and vomiting and diarrhea as per HPI. No history of cardiac or pulmonary problems. No urinary symptoms, no leg pain or swelling, and no neurologic symptoms.  The patient was seen in the emergency room on Monday. At that time she had a negative CT scan of the abdomen. She was scheduled for a gallbladder ultrasound which was negative as well.  PHYSICAL EXAMINATION:  VITAL SIGNS:  Temperature 97.3, pulse was 65, respirations of 24, initial blood pressure at the time of admission to the emergency room was 168/99.  HEENT:  Pupils are equal and reactive to light. Sclerae nonicteric, conjunctivae are noninjected. Extraocular movements appear to be intact. Oropharynx mucous membranes appear dry and tongue is coated. There are no oral lesions noted.  NECK:  Supple. There is no JVD or adenopathy noted.  CHEST AND LUNGS:  Clear to auscultation.  CARDIAC:  Rhythm is regular.  BREAST EXAM:  Deferred.  ABDOMEN:  Obese, soft and nontender. Bowel sounds are present in all four quadrants. There is no rebound, guarding, or obvious organomegaly noted.  PELVIC EXAM:  Performed by Dr. Rosalia Hammers did not reveal any discharge. There was some mild  cervical motion tenderness noted and gonorrhea and chlamydia samples were taken at that time.  EXTREMITIES:  Distal pulses are intact. Calves are nontender. No peripheral edema is noted.  NEUROLOGIC:  Grossly nonfocal.  ANCILLARY DATA:  White count is 5.6, hemoglobin 14.6, hematocrit of 43.4, and platelets of 351. A urinalysis is negative. Sodium of 139, potassium 3.5, chloride 109, CO2 of 23, glucose of 112. BUN 8,  creatinine 0.8, calcium of 9.2, total protein of 7.5, albumin of 4.0, AST is 19, ALT of 13, alkaline phosphatase of 59, a total bili of 0.4, lipase of 23, and amylase of 141. The patient did have a negative beta HCG the end of March. A repeat HCG is pending at the time of this dictation.  ASSESSMENT AND PLAN: GASTROENTEROLOGY:  Nausea and vomiting and diarrhea. The patient has had negative gallbladder ultrasound, negative CT scan, and endoscopy approximately one month ago. Suspect that patient does need repeat endoscopy with biopsies if these have not been done previously. Have spoken with patient as to whether she desires admission here to see a gastroenterologist at this institution versus going home and following up with her regular gastroenterologist, Dr. Marlene Bast in [place]. The patient states that she is comfortable right now and that she has an appointment with Dr. Marlene Bast tomorrow and that she would prefer to go home and follow up with him. Therefore, we will discharge her home where she will be given pain medications and antiemetics, enough to get her through until tomorrow, and she will follow up with Dr. Marlene Bast in [place]. Also patient was advised that she should consider discontinuation of tobacco use. Dictated by:   Shellia Carwin, M.D. Attending Physician:  Donnetta Hutching DD:  04/30/01 TD:  05/01/01 Job: [id number]
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
## 2010-06-13 ENCOUNTER — Inpatient Hospital Stay: Payer: Self-pay | Admitting: Psychiatry

## 2010-07-10 ENCOUNTER — Emergency Department: Payer: Self-pay | Admitting: Unknown Physician Specialty

## 2010-10-12 LAB — INFLUENZA A+B VIRUS AG-DIRECT(RAPID)
Inflenza A Ag: NEGATIVE
Influenza B Ag: NEGATIVE

## 2010-10-25 LAB — CBC
Hemoglobin: 13.8 g/dL (ref 12.0–15.0)
MCHC: 33 g/dL (ref 30.0–36.0)
Platelets: 343 10*3/uL (ref 150–400)
RDW: 14.9 % (ref 11.5–15.5)

## 2010-10-25 LAB — COMPREHENSIVE METABOLIC PANEL
ALT: 11 U/L (ref 0–35)
AST: 15 U/L (ref 0–37)
Albumin: 3.8 g/dL (ref 3.5–5.2)
Alkaline Phosphatase: 47 U/L (ref 39–117)
Calcium: 8.9 mg/dL (ref 8.4–10.5)
GFR calc Af Amer: >60 mL/min (ref >60)
Potassium: 3.1 mEq/L — ABNORMAL LOW (ref 3.5–5.1)
Sodium: 136 mEq/L (ref 135–145)
Total Protein: 6.6 g/dL (ref 6.0–8.3)

## 2010-10-25 LAB — DIFFERENTIAL
Basophils Relative: 1 % (ref 0–1)
Eosinophils Absolute: 0 10*3/uL (ref 0.0–0.7)
Eosinophils Relative: 1 % (ref 0–5)
Lymphs Abs: 2.5 10*3/uL (ref 0.7–4.0)
Monocytes Absolute: 0.4 10*3/uL (ref 0.1–1.0)
Monocytes Relative: 6 % (ref 3–12)
Neutrophils Relative %: 58 % (ref 43–77)

## 2011-06-04 ENCOUNTER — Other Ambulatory Visit: Payer: Self-pay

## 2011-06-04 LAB — COMPREHENSIVE METABOLIC PANEL
Alkaline Phosphatase: 68 U/L (ref 50–136)
Bilirubin,Total: 0.3 mg/dL (ref 0.2–1.0)
Calcium, Total: 8.3 mg/dL — ABNORMAL LOW (ref 8.5–10.1)
Creatinine: 0.73 mg/dL (ref 0.60–1.30)
EGFR (African American): >60
EGFR (Non-African Amer.): >60
Glucose: 94 mg/dL (ref 65–99)
Potassium: 4 mmol/L (ref 3.5–5.1)
SGOT(AST): 19 U/L (ref 15–37)
Sodium: 141 mmol/L (ref 136–145)
Total Protein: 6.8 g/dL (ref 6.4–8.2)

## 2011-06-04 LAB — CBC WITH DIFFERENTIAL/PLATELET
Eosinophil %: 2.3 %
HGB: 13.1 g/dL (ref 12.0–16.0)
Lymphocyte #: 2 10*3/uL (ref 1.0–3.6)
Lymphocyte %: 37.3 %
MCHC: 32.7 g/dL (ref 32.0–36.0)
MCV: 79 fL — ABNORMAL LOW (ref 80–100)
Monocyte %: 6.3 %
Platelet: 299 10*3/uL (ref 150–440)
RDW: 17.5 % — ABNORMAL HIGH (ref 11.5–14.5)

## 2011-06-04 LAB — LIPID PANEL
Cholesterol: 151 mg/dL (ref 0–200)
HDL Cholesterol: 46 mg/dL (ref 40–60)
VLDL Cholesterol, Calc: 12 mg/dL (ref 5–40)

## 2011-06-04 LAB — HEMOGLOBIN A1C: Hemoglobin A1C: 6 % (ref 4.2–6.3)

## 2011-08-10 ENCOUNTER — Inpatient Hospital Stay: Payer: Self-pay | Admitting: Psychiatry

## 2011-08-10 LAB — ACETAMINOPHEN LEVEL: Acetaminophen: <2 ug/mL

## 2011-08-10 LAB — DRUG SCREEN, URINE
Barbiturates, Ur Screen: NEGATIVE (ref ?–200)
Benzodiazepine, Ur Scrn: NEGATIVE (ref ?–200)
Cannabinoid 50 Ng, Ur ~~LOC~~: POSITIVE (ref ?–50)
Methadone, Ur Screen: NEGATIVE (ref ?–300)
Opiate, Ur Screen: POSITIVE (ref ?–300)
Phencyclidine (PCP) Ur S: NEGATIVE (ref ?–25)

## 2011-08-10 LAB — URINALYSIS, COMPLETE
Blood: NEGATIVE
Glucose,UR: NEGATIVE mg/dL (ref 0–75)
Leukocyte Esterase: NEGATIVE
Nitrite: NEGATIVE
Ph: 5 (ref 4.5–8.0)
Specific Gravity: 1.033 (ref 1.003–1.030)

## 2011-08-10 LAB — SALICYLATE LEVEL: Salicylates, Serum: 2.5 mg/dL

## 2011-08-10 LAB — CBC
HCT: 42.2 % (ref 35.0–47.0)
HGB: 13.4 g/dL (ref 12.0–16.0)
MCV: 77 fL — ABNORMAL LOW (ref 80–100)
Platelet: 388 10*3/uL (ref 150–440)
RDW: 17.2 % — ABNORMAL HIGH (ref 11.5–14.5)
WBC: 9.6 10*3/uL (ref 3.6–11.0)

## 2011-08-10 LAB — ETHANOL: Ethanol: <3 mg/dL

## 2011-08-10 LAB — COMPREHENSIVE METABOLIC PANEL
Albumin: 3.5 g/dL (ref 3.4–5.0)
BUN: 7 mg/dL (ref 7–18)
Calcium, Total: 8.8 mg/dL (ref 8.5–10.1)
SGOT(AST): 19 U/L (ref 15–37)
SGPT (ALT): 15 U/L
Total Protein: 7.8 g/dL (ref 6.4–8.2)

## 2011-08-10 LAB — TSH: Thyroid Stimulating Horm: 0.29 u[IU]/mL — ABNORMAL LOW

## 2011-08-11 LAB — POTASSIUM: Potassium: 3.4 mmol/L — ABNORMAL LOW (ref 3.5–5.1)

## 2011-08-17 LAB — URINALYSIS, COMPLETE
Bilirubin,UR: NEGATIVE
Hyaline Cast: 27
Ketone: NEGATIVE
Protein: 30
Specific Gravity: 1.023 (ref 1.003–1.030)
Squamous Epithelial: 3
WBC UR: 8 /HPF (ref 0–5)

## 2011-09-14 ENCOUNTER — Inpatient Hospital Stay: Payer: Self-pay | Admitting: Psychiatry

## 2011-09-14 LAB — COMPREHENSIVE METABOLIC PANEL
Albumin: 3.3 g/dL — ABNORMAL LOW (ref 3.4–5.0)
Alkaline Phosphatase: 76 U/L (ref 50–136)
Anion Gap: 5 — ABNORMAL LOW (ref 7–16)
BUN: 8 mg/dL (ref 7–18)
Chloride: 107 mmol/L (ref 98–107)
Creatinine: 0.7 mg/dL (ref 0.60–1.30)
EGFR (African American): >60
Glucose: 106 mg/dL — ABNORMAL HIGH (ref 65–99)
Potassium: 3.3 mmol/L — ABNORMAL LOW (ref 3.5–5.1)
SGOT(AST): 18 U/L (ref 15–37)
SGPT (ALT): 15 U/L (ref 12–78)
Total Protein: 7 g/dL (ref 6.4–8.2)

## 2011-09-14 LAB — DRUG SCREEN, URINE
Amphetamines, Ur Screen: NEGATIVE (ref ?–1000)
Benzodiazepine, Ur Scrn: POSITIVE (ref ?–200)
Cocaine Metabolite,Ur ~~LOC~~: NEGATIVE (ref ?–300)
MDMA (Ecstasy)Ur Screen: NEGATIVE (ref ?–500)
Tricyclic, Ur Screen: NEGATIVE (ref ?–1000)

## 2011-09-14 LAB — URINALYSIS, COMPLETE
Glucose,UR: NEGATIVE mg/dL (ref 0–75)
Ketone: NEGATIVE
Protein: 30
RBC,UR: 3 /HPF (ref 0–5)
WBC UR: 6 /HPF (ref 0–5)

## 2011-09-14 LAB — ETHANOL: Ethanol %: <0.003 % (ref 0.000–0.080)

## 2011-09-14 LAB — CBC
HGB: 12.8 g/dL (ref 12.0–16.0)
MCH: 24.8 pg — ABNORMAL LOW (ref 26.0–34.0)
MCHC: 32.7 g/dL (ref 32.0–36.0)
Platelet: 392 10*3/uL (ref 150–440)
RBC: 5.14 10*6/uL (ref 3.80–5.20)

## 2011-09-17 LAB — LIPID PANEL
Cholesterol: 179 mg/dL (ref 0–200)
Triglycerides: 126 mg/dL (ref 0–200)

## 2011-09-22 LAB — URINALYSIS, COMPLETE
Blood: NEGATIVE
Ketone: NEGATIVE
Ph: 7 (ref 4.5–8.0)
Protein: NEGATIVE
Specific Gravity: 1.02 (ref 1.003–1.030)

## 2011-09-23 LAB — COMPREHENSIVE METABOLIC PANEL
Anion Gap: 8 (ref 7–16)
BUN: 21 mg/dL — ABNORMAL HIGH (ref 7–18)
Co2: 27 mmol/L (ref 21–32)
Creatinine: 0.83 mg/dL (ref 0.60–1.30)
EGFR (African American): >60
SGPT (ALT): 19 U/L (ref 12–78)

## 2011-09-23 LAB — CBC WITH DIFFERENTIAL/PLATELET
Basophil %: 0.2 %
Eosinophil #: 0 10*3/uL (ref 0.0–0.7)
Eosinophil %: 0 %
HCT: 43.5 % (ref 35.0–47.0)
MCHC: 32.7 g/dL (ref 32.0–36.0)
MCV: 76 fL — ABNORMAL LOW (ref 80–100)
Monocyte #: 1 x10 3/mm — ABNORMAL HIGH (ref 0.2–0.9)
Neutrophil #: 7.3 10*3/uL — ABNORMAL HIGH (ref 1.4–6.5)
RDW: 18.1 % — ABNORMAL HIGH (ref 11.5–14.5)

## 2011-09-24 LAB — CARBAMAZEPINE LEVEL, TOTAL: Carbamazepine: 9.1 ug/mL (ref 4.0–12.0)

## 2012-03-05 ENCOUNTER — Emergency Department: Payer: Self-pay | Admitting: Emergency Medicine

## 2012-03-05 LAB — CBC
HCT: 40.9 % (ref 35.0–47.0)
MCHC: 31.9 g/dL — ABNORMAL LOW (ref 32.0–36.0)
Platelet: 376 10*3/uL (ref 150–440)
RDW: 17.6 % — ABNORMAL HIGH (ref 11.5–14.5)

## 2012-03-05 LAB — COMPREHENSIVE METABOLIC PANEL
Albumin: 3.8 g/dL (ref 3.4–5.0)
Alkaline Phosphatase: 90 U/L (ref 50–136)
Anion Gap: 6 — ABNORMAL LOW (ref 7–16)
Bilirubin,Total: 0.5 mg/dL (ref 0.2–1.0)
Calcium, Total: 8.9 mg/dL (ref 8.5–10.1)
Chloride: 110 mmol/L — ABNORMAL HIGH (ref 98–107)
Co2: 25 mmol/L (ref 21–32)
Creatinine: 0.88 mg/dL (ref 0.60–1.30)
EGFR (African American): >60
EGFR (Non-African Amer.): >60
Glucose: 108 mg/dL — ABNORMAL HIGH (ref 65–99)
SGOT(AST): 18 U/L (ref 15–37)
SGPT (ALT): 16 U/L (ref 12–78)
Sodium: 141 mmol/L (ref 136–145)

## 2012-03-05 LAB — URINALYSIS, COMPLETE
Bilirubin,UR: NEGATIVE
Blood: NEGATIVE
Glucose,UR: NEGATIVE mg/dL (ref 0–75)
Ketone: NEGATIVE
Ph: 6 (ref 4.5–8.0)
Protein: 25
Specific Gravity: 1.02 (ref 1.003–1.030)
Squamous Epithelial: 1
WBC UR: 2 /HPF (ref 0–5)

## 2012-03-05 LAB — DRUG SCREEN, URINE
Amphetamines, Ur Screen: NEGATIVE (ref ?–1000)
Cannabinoid 50 Ng, Ur ~~LOC~~: POSITIVE (ref ?–50)
Cocaine Metabolite,Ur ~~LOC~~: POSITIVE (ref ?–300)
MDMA (Ecstasy)Ur Screen: NEGATIVE (ref ?–500)
Methadone, Ur Screen: NEGATIVE (ref ?–300)

## 2012-03-05 LAB — TSH: Thyroid Stimulating Horm: 0.6 u[IU]/mL

## 2012-03-05 LAB — ETHANOL
Ethanol %: <0.003 % (ref 0.000–0.080)
Ethanol: <3 mg/dL

## 2012-10-24 ENCOUNTER — Emergency Department: Payer: Self-pay | Admitting: Emergency Medicine

## 2013-05-02 ENCOUNTER — Emergency Department: Payer: Self-pay | Admitting: Emergency Medicine

## 2013-09-16 ENCOUNTER — Emergency Department: Payer: Self-pay | Admitting: Emergency Medicine

## 2013-09-19 LAB — BETA STREP CULTURE(ARMC)

## 2013-09-26 ENCOUNTER — Emergency Department: Payer: Self-pay | Admitting: Emergency Medicine

## 2014-05-10 NOTE — H&P (Signed)
PATIENT NAME:  [name], [name] MR#:  [id number] DATE OF BIRTH:  12-27-1975  DATE OF ADMISSION:  09/14/2011  IDENTIFYING INFORMATION: The patient is a 39 year old African American female who was discharged on 08/20/2011 after being stabilized at [facility]. The patient comes back for readmission with a chief complaint "I was depressed when I left. I was not completely fixed. I went to see a prophet in [place], [place] to see if a miracle can happen and nothing happened. When I went home my situation was not changed. I came to know my son had 30 days instead of 60 days to serve and they keep him at home and that keeps the trouble going and nothing has changed and I'm still depressed".   HISTORY OF PRESENT ILLNESS: The patient was discharged from [facility] on 08/20/2011 after being an inpatient for eight days. According to information obtained, she was under stress as described above and still continued to have the same stress when she was discharged to go home.   The patient was discharged on the following medications and reports that she has been taking the same: 1. OxyContin ER 40 mg daily. 2. Oxycodone 15 mg twice a day. 3. Omeprazole 40 mg daily. 4. Lisinopril 40 mg daily. 5. HCTZ 50 mg daily. 6. Celexa 20 mg daily.  The patient admits to compliance with the medications.  PAST PSYCHIATRIC HISTORY: She has had inpatient hospitalizations on Psychiatry on many occasions. There were several suicide attempts by cutting self and overdosing. Currently she is under the care of [facility] in the community. Last appointment was on 09/04/2011; next appointment is to be made. She has a three year diagnosis of bipolar disorder and in the past was on Geodon, Celexa, and Remeron.   FAMILY HISTORY OF MENTAL ILLNESS: Son has polysubstance abuse and currently has legal charges.   PAST MEDICAL HISTORY: 1. Degenerative disk disease. 2. Chronic back pain. 3. Gastroesophageal reflux  disease. 4. Hypertension.  ALLERGIES: Allergic to Demerol and Tylenol.   MEDICATIONS: As stated above.   SOCIAL HISTORY: The patient is from [facility]. Graduated from high school. Used to work as a Training and development officer. Has been on disability for mental illness and back problems. At this time  she is married and has two children living with her at home and her son has legal problems. She is a patient of Dr. Sherlynn Stalls at [facility] and Dr. Mare Ferrari in [place] for medical needs. She gets her pain killers from Dr. Mare Ferrari for her chronic back pain.   PHYSICAL EXAMINATION:  VITAL SIGNS: Temperature 97.8, pulse 68 per minute and regular, respirations 18 per minute and regular, blood pressure 130/80 mmHg.  HEENT: Head is normocephalic and atraumatic. Pupils equal, round, and reactive to light and accommodation. Fundi bilaterally benign. EOMS visualised. Tympanic membranes no exudate.  NECK: Supple without any organomegaly, lymphadenopathy, or thyromegaly.  CHEST: Normal expansion. Normal breath sounds heard.   HEART: Normal S1, S2 without any murmurs or gallops.  ABDOMEN: Soft, nontender, and nondistended. Positive bowel sounds.  MUSCULOSKELETAL: Normal muscle strength in all extremities.   SKIN: No rashes. No bruises. No cyanosis. No clubbing.  LYMPHATIC: No cervical lymphadenopathy.  NEUROLOGIC: Gait is normal. Romberg is negative. Cranial nerves II through XII are grossly intact. Deep tendon reflexes 2+ and normal. Plantars are normal response.   MENTAL STATUS EXAMINATION: The patient is dressed in hospital clothes, alert and oriented to place, person, and time. She is upset, irritable, and angry that she was  discharged early before she was fixed and her home situation is the same and she continues to feel depressed, continues to feel hopeless and helpless. She admits that she needs help with the same. Denies suicidal or homicidal ideas or plans and reports "not right now and not at the moment but  I do have wishes because I'm upset and stay upset". No evidence of psychosis. Denies auditory or visual hallucinations. Denies paranoid or suspicious ideas. Denies any thought insertion or thought control. Denies having any grandiose ideas. Thought process logical and goal directed. She reports that she stays depressed because of her living situation which is not changing and she wants more help with the same. Cognition is intact. Recall and memory are good and could remember all three objects in several minutes. She could spell the word world forward and backward. She knows the capital of [place], current Software engineer and previous presidents. Does admit to sleep and appetite disturbance because she stays upset and frustrated all the time at home. Insight and judgment are guarded.   IMPRESSION: AXIS I: 1. Mood disorder, not otherwise specified. 2. Cannabis dependence. 3. History of opiate dependence.  4. THC abuse.   AXIS II: Personality disorder, not otherwise specified.  AXIS III: 1. Degenerative disk disease.  2. Chronic back pain. 3. Gastroesophageal reflux disease. 4. Hypertension.  AXIS IV: Severe. Long history of mental illness with substance abuse, constant family conflicts, financial, occupational, poor primary support, and currently son having legal problems and living situation is not helpful to her feeling better.   AXIS V: 25.   PLAN: The patient is admitted to [facility] for close observation, evaluation, and help. She will be started back on the same medications. During the stay in the hospital she will be given milieu therapy and supportive counseling. She will take part in individual and group therapy where opioid dependence and THC dependence will be addressed. Social Services will look into her living situation and family conference will be held if needed so that her living situation can be addressed and her depression can be  helped.   ____________________________ Wallace Cullens. Franchot Mimes, MD skc:drc D: 09/14/2011 17:10:25 ET T: 09/15/2011 07:48:13 ET JOB#: [id number]  cc: Arlyn Leak K. Franchot Mimes, MD, <Dictator> Dewain Penning MD ELECTRONICALLY SIGNED 09/15/2011 18:17

## 2014-05-10 NOTE — H&P (Signed)
PATIENT NAME:  [name], [name] MR#:  [id number] DATE OF BIRTH:  [birth year]  DATE OF ADMISSION:  08/10/2011  She was admitted on 07/20, assessed on 07/22.   REFERRING PHYSICIAN: Dr. Lenise Arena.   ADMITTING PHYSICIAN: Dr. Camie Patience.   ATTENDING PHYSICIAN: Dr. Orson Slick.   IDENTIFYING DATA: [name] is a 39 year old female with a history of mood instability.   CHIEF COMPLAINT: I cannot tolerate the stress anymore.   HISTORY OF PRESENT ILLNESS: [name] has been under considerable stress. She discontinued taking her medications a while ago. When she went to see a new psychiatrist at [facility] and was started on Celexa, she did not have money to buy medication. She became increasingly depressed. Her additional stressors include the fact that she was caught driving without a license and has a court hearing on Tuesday. Also, her 52 year old son dropped out of school, has been using drugs, giving her a hard time. The patient came to the hospital complaining of thoughts of hurting herself by overdose. She denies symptoms of psychosis. Denies symptoms suggestive of bipolar mania. She denies alcohol or illicit substance use.   PAST PSYCHIATRIC HISTORY: She was hospitalized here several times. There are suicide attempts by cutting and overdose. She is currently in care of [facility]. Her last appointment was on 07/17. The following one is on 08/14. She has not been compliant with medications. She has prior diagnoses of bipolar disorder and in the past was maintained on Geodon, Celexa and Remeron.   FAMILY PSYCHIATRIC HISTORY: Son with polysubstance abuse.   PAST MEDICAL HISTORY:  1. Degenerative disk disease.  2. Chronic back pain. 3. Gastroesophageal reflux disease. 4. Hypertension   ALLERGIES: Demerol and Tylenol.   MEDICATIONS ON ADMISSION:  1. OxyContin ER 40 mg daily.  2. Oxycodone 15 mg twice daily.  3. Omeprazole 40 mg daily.  4. Lisinopril 40 mg daily.   5. Hydrochlorothiazide 25 mg daily.  6. Celexa 20 mg daily.  7. Ambien 10 mg at night.   SOCIAL HISTORY: She is originally from [place] [facility] [place]. She graduated from high school. She used to work as a Training and development officer. She is on disability for mental illness and back problems now. She has been married. She has two children living with her at home. She is a patient of Dr. Sherlynn Stalls at [facility] and Dr. Mare Ferrari in [place] for medical needs. Dr. Mare Ferrari is the one to prescribe narcotic pain killers for the patient.   REVIEW OF SYSTEMS: CONSTITUTIONAL: No fevers or chills. No weight changes. EYES: No double or blurred vision. ENT: No hearing loss. RESPIRATORY: No shortness of breath or cough. CARDIOVASCULAR: No chest pain or orthopnea. GASTROINTESTINAL: No abdominal pain, nausea, vomiting, or diarrhea. GU: No incontinence or frequency. ENDOCRINE: No heat or cold intolerance. LYMPHATIC: No anemia or easy bruising. INTEGUMENTARY: No acne or rash. MUSCULOSKELETAL: No muscle or joint pain. NEUROLOGIC: No tingling or weakness. PSYCHIATRIC: See history of present illness for details.   PHYSICAL EXAMINATION:  VITAL SIGNS: Blood pressure 176/97, pulse 82, respirations 20, temperature 98.7.   GENERAL: This is a well-developed female in no acute distress.   HEENT: The pupils are equal, round, and reactive to light. Sclerae anicteric.   NECK: Supple. No thyromegaly.   LUNGS: Clear to auscultation. No dullness to percussion.   HEART: Regular rhythm and rate. No murmurs, rubs, or gallops.   ABDOMEN: Soft, nontender, nondistended. Positive bowel sounds.   MUSCULOSKELETAL: Normal muscle strength in all extremities.   SKIN: No rashes or bruises.  LYMPHATIC: No cervical adenopathy.   NEUROLOGIC: Cranial nerves II through XII are intact. Normal gait.   LABORATORY DATA: Chemistries are within normal limits except for blood glucose of 107 and potassium of 3.4. Blood alcohol level is 0. LFTs within normal limits. TSH  0.29. Urine tox screen positive for marijuana and opiates. CBC within normal limits. Urinalysis is not suggestive of urinary tract infection. Serum acetaminophen and salicylates are low.   MENTAL STATUS EXAMINATION ON ADMISSION: The patient is alert and oriented to person, place, time, and situation. She is pleasant, polite, and cooperative. She recognizes me from previous admission. There is severe psychomotor retardation. She maintains some eye contact. Her speech is slow and soft. Mood is depressed with anxious affect. Thought processing is logical and goal oriented. Thought content: She denies thoughts of hurting herself and is able to contract for safety in the hospital but was admitted after voicing suicidal ideation in the community. There are no delusions or paranoia. There are no auditory or visual hallucinations. Her cognition is grossly intact. She registers three out of three and recalls three out of three objects after five minutes. She can spell world forward and backward. She knows the current president. Her insight and judgment are questionable.   SUICIDE RISK ASSESSMENT ON ADMISSION: This is a patient with a lifelong history of depression, mood instability and multiple social stressors who came to the hospital suicidal.   DIAGNOSES:  AXIS I:  1. Mood disorder, not otherwise specified.  2. Cannabinoids abuse. 3. Opiate dependence.   AXIS II: Personality disorder, not otherwise specified.   AXIS III:  1. Degenerative joint disease. 2. Chronic back pain. 3. Gastroesophageal reflux disease. 4. Hypertension.   AXIS IV: Mental illness, substance abuse, family conflict, financial, primary support, poor compliance, legal.   AXIS V: GAF on admission 25.   PLAN: The patient was [facility] for safety, stabilization, and medication management. She was initially placed on suicide precautions and was closely monitored for any  unsafe behaviors. She underwent full psychiatric and risk assessment. She received pharmacotherapy, individual and group psychotherapy, substance abuse counseling, and support from therapeutic milieu.  1. Suicidal ideation. This has resolved. The patient is able to contract for safety.  2. Mood. She was restarted on Celexa. Indeed, she never took it. She says she was unable to buy it. Will monitor for symptoms of depression.  3. Medical. We will continue all of her medications as prescribed by her primary provider.  4. Chronic pain. We will continue OxyContin and oxycodone as prescribed by Dr. Mare Ferrari in [place].   DISPOSITION: The patient will be discharged to home.   ____________________________ Herma Ard B. Bary Leriche, MD jbp:ap D: 08/12/2011 15:50:11 ET T: 08/12/2011 16:14:38 ET JOB#: [id number]  cc: Kash Davie B. Bary Leriche, MD, <Dictator> Clovis Fredrickson MD ELECTRONICALLY SIGNED 08/13/2011 4:43

## 2014-05-10 NOTE — H&P (Signed)
PATIENT NAME:  [name], HANCHER MR#:  [id number] DATE OF BIRTH:  March 24, 1975  DATE OF ADMISSION:  08/10/2011  INITIAL ASSESSMENT/PSYCHIATRIC EVALUATION   IDENTIFYING INFORMATION: The patient is a 39 year old African American female who is not employed and is on disability for a few years for degenerative osteoarthritis and mental illness. The patient is married for 13 years and lives with her husband who is 59 years old and her two children who are 79 and 15 from another relationship. The patient comes for readmission to [facility] after being discharged in May of 2012 with the chief complaint "things started getting too much for me, I cannot handle them no more, my 63 year old son quit school and he is smoking THC and I cannot complain about him and I wish he is not living with me, and I cannot take the stress anymore".    HISTORY OF PRESENT ILLNESS: When the patient was asked when she last felt well she reported "I have not felt well in a long time. I cannot remember when I felt well. I started having suicidal ideas because things started being overwhelming. My husband is of no help as he is on disability himself and everything is getting too much for me and I have suicidal thoughts and I asked my neighbor to bring me here".    PAST PSYCHIATRIC HISTORY: The patient reports that she has had four inpatient hospitalizations to psychiatry so far.  In fact, according to the chart, she had been an inpatient many times at [facility]. The longest period of inpatient hospitalization was for seven days.  Her last inpatient hospitalization was in May of 2012 for bipolar disorder. She does admit to suicide attempts when she tried to overdose on pills and tried to cut her wrists.  She is being followed on an outpatient basis by [facility] and last appointment was on 08/07/2011. Her next appointment is coming up on 09/04/2011. She was started on Celexa 20 mg by mouth daily, but  the patient does not have money to get it filled. At the time of discharge from [facility], on 06/19/2010, she was to be on the following medications:  1. Multivitamin once a day. 2. Celebrex 100 mg capsule once a day. 3. HCTZ 12.5 mg once a day. 4. Lisinopril 40 mg daily. 5. Geodon 80 mg twice a day. 6. Celexa 40 mg once a day. 7. Mirtazapine 30 mg at bedtime.   The patient reports that she quit taking them a long time ago because she ran out of them and then she had no way of getting them filled. She went to her primary care physician who refused to fill them and told her that she should see a psychiatrist and she was not able to afford to go to a psychiatrist.    FAMILY HISTORY OF MENTAL ILLNESS: The patient reports that her son has problems with polydependance. Mother had schizophrenia and she died.   FAMILY HISTORY: She was raised by her parents. Her father is living and is in his 32s. Mother died of a myocardial infarction at age 40 years. She has one brother. She is not close to family.   PERSONAL HISTORY: Born in [place].  She graduated from high school, no college.   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 She quit when she had to have surgery on her back. 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   MILITARY HISTORY: None.  MARRIAGES: Married once. Married for 13 years. Her husband is on disability and does not know the reason. She has two children from a relationship. Children get no support from the father.    ALCOHOL AND DRUGS: First drink of alcohol was at 17 years. She does not have problems with alcohol. She had a DUI because of medicine in her system. According to the information obtained from the chart, she had two DUIs in the past. She lost her driver's license. She has never been arrested for public drunkenness. She does smoke THC every other day, a couple of joints each day. Has been smoking at this  rate for 10 years or more. Denies using IV drugs.  She does admit smoking nicotine cigarettes at the rate of a pack a day for many years.   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 No history of seizures. No history of motor vehicle accident. Never been unconscious. She is being followed by Dr. Grant Fontana in [place], [place]. Her last appointment was 07/23/2011 and the next appointment is 08/23/2011.  DRUG ALLERGIES: Demerol.  PHYSICAL EXAMINATION:  VITALS: Blood pressure 124/74 mmHg, heart rate 74 per minute and regular, respirations 20 per minute and regular, and temperature 98.2.   HEENT: Head is normocephalic, atraumatic. Pupils are equally round and reactive to light. Fundi bilaterally benign. Extraocular movements visualized. Tympanic membranes visualized. No exudates.   NECK: Supple without any organomegaly, lymphadenopathy, or thyromegaly.   LUNGS: Clear to auscultation. No rales or rhonchi.   HEART: Normal S1 and S2 without any murmurs or gallops.   ABDOMEN: Soft. No organomegaly. Bowel sounds heard.   EXTREMITIES: 2+ pedal pulses bilaterally. No cyanosis, clubbing, or edema.  RECTAL/PELVIC: Deferred.  NEURO: Gait is normal. Romberg is negative. Cranial nerves II through XII grossly intact. DTRs 2+. Plantars have normal response.   MENTAL STATUS EXAMINATION: The patient is dressed in street clothes, alert and oriented to place, person, and time, and fully aware of the situation that brought her for admission to [facility]. Affect is flat with mood constricted. Does admit feeling low and down and admits feeling depressed about her current situation and being overwhelmed with the way her family is behaving. Admits to  feeling hopeless and helpless and admits feeling worthless and useless. She does have suicidal wishes but no plan and contracts for safety. No evidence of psychosis. Denies auditory or visual hallucinations, denies hearing voices or seeing things. Denies any paranoid or suspicious ideas. Denies thought insertion or thought control. Denies having any grandiose ideas. Cognition is grossly intact. General knowledge and information is fair for level of education. She does admit to sleep and appetite disturbance as she is not able to focus because of being overwhelmed with stresses of life. Memory and recall are good.  Could remember all three objects asked to remember in a few minutes and several minutes. She could count money. Insight and judgment guarded.  IMPRESSION:   AXIS I:  1. Bipolar disorder type II depressed.  2. Cannabis abuse, chronic continuous.  3. History of opioid dependence according to the chart. 4. History of cocaine use, in full remission. 5. Nicotine dependence.  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   AXIS III: Degenerative joint disease, chronic back pain status post back surgery, gastroesophageal reflux disease and is on medication, and hypertension.   AXIS IV: Severe - occupation, financial, conflicts with her son, and lack of compliance.  AXIS V: Global assessment functioning 25.   PLAN: The patient is admitted to [facility] for closer observation, evaluation, and help. She will be started back on all of previous medications on which she was stabilized in May of 2013 as these have helped to calm and improve her depression. During her stay in the hospital, she will be given milieu therapy and supportive counseling. She will take part in individual and group therapy where she can ventilate her feelings and get help with coping skills and dealing with stresses of life when she is overwhelmed. At the time of  discharge, the patient will be stabilized and will not be feeling depressed. Appropriate followup appointments will be made by social services and hopefully she will get accepted to [facility] so that she can afford the medications as she has been noncompliant because of not being able to afford the medications.   ____________________________ Wallace Cullens. Franchot Mimes, MD skc:slb D: 08/10/2011 23:11:35 ET     T: 08/11/2011 10:51:25 ET       JOB#: [id number] Dewain Penning MD ELECTRONICALLY SIGNED 08/11/2011 19:21

## 2014-05-13 NOTE — Consult Note (Signed)
PATIENT NAME:  [name], [name] MR#:  [id number] DATE OF BIRTH:  [birth year]  DATE OF CONSULTATION:  03/06/2012  REFERRING PHYSICIAN:   CONSULTING PHYSICIAN:  Gonzella Lex, MD  IDENTIFYING INFORMATION AND REASON FOR CONSULT: This is a 39 year old woman with a history of bipolar disorder, type 2, as well as polysubstance dependence and chronic pain and medical problems. She presented herself to the Emergency Room requesting evaluation and treatment. She was somewhat labile and agitated when she came in. Made some vaguely threatening of suicidal statements, but did not specifically say she was going to kill herself. Evaluation for appropriate treatment.   HISTORY OF PRESENT ILLNESS: Information obtained from the patient and the chart. The patient came to the Emergency Room reporting that she was feeling upset and requesting psychiatric evaluation. She had made a statement about saying that she felt like stabbing herself in the heart. She has been off of her medicine for many days and is complaining of chronic pain. When I saw the patient today, she stated that her chief concern was that she was off of her medicine. She is off her narcotic pain medicine because her primary care doctor lost his ability to prescribe scheduled II and III controlled substances so now she has been off her pain medicine. She also has been using some cocaine intermittently recently as well as chronic marijuana use. She also stopped taking multiple of her usual medications because she mistakenly thought that she would not be able to get them filled because she thought her doctor had lost all of his license. She is supposed to be following up at [facility] and admits that she has not been fully compliant because she does not particularly like the way that they treat her there. The patient reports that her mood has been angry and upset. Sleep has been somewhat chaotic. Denies hallucinations or psychotic symptoms. She denies to me today that  she has actual intent to harm or kill herself. She does have a stable place to live. The patient does understand that she needs to stop using cocaine and stop using other drugs. She is able to discuss her appropriate needs and plan.   PAST PSYCHIATRIC HISTORY: The patient has had several prior admissions to the hospital with mood lability. She does have a past history of cutting and of overdoses. She has a history of substance abuse and dependence. She has been maintained on chronic narcotics for back pain, through her primary care doctor, until recently and is now off of them because he lost his ability to prescribe. The patient has been treated at [facility] and has been maintained on a combination primarily of Seroquel and carbamazepine. Also takes Celexa.   SOCIAL HISTORY: She is married, lives with her husband and 2 children. Does not work outside the home.   PAST MEDICAL HISTORY: The patient has chronic back pain related to degenerative disks and the result of back surgery. Also has high blood pressure and gastric reflux symptoms.   CURRENT MEDICATIONS: She claims that she is off of all of her medicines.   ALLERGIES: No known drug allergies.   REVIEW OF SYSTEMS:  Chief complaint is her back pain. Also says that she feels angry because she feels like she has been abandoned by treatment providers. Denies suicidal ideation. Denies psychotic symptoms. Denies homicidal ideation.   MENTAL STATUS EXAM: Reasonably well-groomed woman who looks her stated age, interviewed in the Emergency Room. She was cooperative and appropriate during the interview. Made good  eye contact. Psychomotor activity normal. Speech normal in rate, tone and volume. Affect is sad, tearful at times, but appropriate to the conversation. Mood is stated as being bad. Thoughts are lucid without loosening of associations or delusional thinking. Denies hallucinations. She denies any current suicidal or homicidal ideation, wish or plan.  Intelligence is average. Judgment and insight have some chronic impairments, but right now she is able to make reasonable decisions based on current information. Alert and oriented x 4.   CURRENT VITAL SIGNS: Blood pressure right now 112/60, respirations 18, pulse 99, temperature 99.   LABORATORY RESULTS: Drug screen positive for cocaine and cannabis and benzodiazepines. TSH normal. Alcohol undetected. Chemistry panel without any really remarkable abnormalities. CBC shows a low MCV at 78, otherwise unremarkable. Urinalysis shows positive protein and red blood cells, not clear if it would be infection.   ASSESSMENT: A 39 year old woman with bipolar disorder type 2 as well as substance dependence. She is upset right now because she is in pain having been forcibly taken off of her narcotic pain medicines. She has made some attempts to find Suboxone treatment or to find pain management treatment, but without success so far. She is off of her other medications because of her own misjudgment. Right now she is not psychotic and she denies suicidal or homicidal ideation. She admits to me that she primarily is concerned about her physical complaints. The patient is not meeting commitment criteria.   TREATMENT PLAN: I discussed with the patient what we could do for her if she was admitted to the hospital. I made it clear that we would not be restarting narcotic pain medicine, both because I think it is probably not indicated long-term and because she has no outpatient provider. I also advised her that I would not be able to start Suboxone, as a maintenance treatment, for her in the hospital. I did say that we could make adjustments to her bipolar medicines if she thought that was necessary and that we could use nonsteroidal pain medicines and Neurontin and other adjunct treatments to see if that would help with her pain. I offered the patient the possibility of admission to the hospital, but she says that she would  prefer to go home. She says that she will call her primary care doctor and see if she can get an appointment again. She will also continue to follow up with [facility]. We will give her outpatient referrals to possible Suboxone treatment. The patient has been given psychoeducation about the importance of staying off of cocaine and available substance abuse treatment.   DIAGNOSIS PRINCIPAL AND PRIMARY:   AXIS I: Bipolar disorder type 2.   SECONDARY DIAGNOSES:  AXIS I: Polysubstance dependence.   AXIS II: Antisocial traits.   AXIS III: Chronic pain, hypertension, gastric reflux.   AXIS IV: Moderate to severe from lack of access to resources.   AXIS V: Functioning at time of discharge 55. ____________________________ Gonzella Lex, MD jtc:sb D: 03/06/2012 12:31:57 ET     T: 03/06/2012 12:55:41 ET        JOB#: [id number] cc: Gonzella Lex, MD, <Dictator> Gonzella Lex MD ELECTRONICALLY SIGNED 03/06/2012 23:12

## 2014-05-13 NOTE — Consult Note (Signed)
Brief Consult Note: Diagnosis: bipolar 2, polysub dep.   Patient was seen by consultant.   Consult note dictated.   Discussed with Attending MD.   Comments: Psychiatry: Patient seen and chart reviewed. Patient denies any suicidal ideation. Has outpt treatment already in place. Has place to live. Not commitable and wants to go home.. Note dictated. Rec release from ER.  Electronic Signatures: Randee Upchurch, Madie Reno (MD)  (Signed 14-Feb-14 12:22)  Authored: Brief Consult Note   Last Updated: 14-Feb-14 12:22 by Gonzella Lex (MD)

## 2014-10-12 ENCOUNTER — Emergency Department (HOSPITAL_COMMUNITY): Payer: Medicare Other

## 2014-10-12 ENCOUNTER — Emergency Department (HOSPITAL_COMMUNITY)
Admission: EM | Admit: 2014-10-12 | Discharge: 2014-10-13 | Disposition: A | Discharge status: Home / Self Care | Payer: Medicare Other | Attending: Emergency Medicine | Admitting: Emergency Medicine

## 2014-10-12 ENCOUNTER — Encounter (HOSPITAL_COMMUNITY): Payer: Self-pay | Admitting: *Deleted

## 2014-10-12 DIAGNOSIS — S99921A Unspecified injury of right foot, initial encounter: Secondary | ICD-10-CM | POA: Insufficient documentation

## 2014-10-12 DIAGNOSIS — S3992XA Unspecified injury of lower back, initial encounter: Secondary | ICD-10-CM | POA: Diagnosis not present

## 2014-10-12 DIAGNOSIS — Y998 Other external cause status: Secondary | ICD-10-CM | POA: Diagnosis not present

## 2014-10-12 DIAGNOSIS — T148XXA Other injury of unspecified body region, initial encounter: Secondary | ICD-10-CM

## 2014-10-12 DIAGNOSIS — W1839XA Other fall on same level, initial encounter: Secondary | ICD-10-CM | POA: Insufficient documentation

## 2014-10-12 DIAGNOSIS — Y9389 Activity, other specified: Secondary | ICD-10-CM | POA: Insufficient documentation

## 2014-10-12 DIAGNOSIS — Z720 Tobacco use: Secondary | ICD-10-CM | POA: Diagnosis not present

## 2014-10-12 DIAGNOSIS — Z79899 Other long term (current) drug therapy: Secondary | ICD-10-CM | POA: Insufficient documentation

## 2014-10-12 DIAGNOSIS — T148 Other injury of unspecified body region: Secondary | ICD-10-CM | POA: Insufficient documentation

## 2014-10-12 DIAGNOSIS — K219 Gastro-esophageal reflux disease without esophagitis: Secondary | ICD-10-CM | POA: Diagnosis not present

## 2014-10-12 DIAGNOSIS — I10 Essential (primary) hypertension: Secondary | ICD-10-CM | POA: Diagnosis not present

## 2014-10-12 DIAGNOSIS — S8991XA Unspecified injury of right lower leg, initial encounter: Secondary | ICD-10-CM | POA: Diagnosis present

## 2014-10-12 DIAGNOSIS — Z791 Long term (current) use of non-steroidal anti-inflammatories (NSAID): Secondary | ICD-10-CM | POA: Insufficient documentation

## 2014-10-12 DIAGNOSIS — Y9289 Other specified places as the place of occurrence of the external cause: Secondary | ICD-10-CM | POA: Diagnosis not present

## 2014-10-12 DIAGNOSIS — Z3202 Encounter for pregnancy test, result negative: Secondary | ICD-10-CM | POA: Diagnosis not present

## 2014-10-12 DIAGNOSIS — G8929 Other chronic pain: Secondary | ICD-10-CM | POA: Diagnosis not present

## 2014-10-12 DIAGNOSIS — F319 Bipolar disorder, unspecified: Secondary | ICD-10-CM | POA: Diagnosis not present

## 2014-10-12 DIAGNOSIS — W19XXXA Unspecified fall, initial encounter: Secondary | ICD-10-CM

## 2014-10-12 DIAGNOSIS — S79911A Unspecified injury of right hip, initial encounter: Secondary | ICD-10-CM | POA: Insufficient documentation

## 2014-10-12 DIAGNOSIS — Z8739 Personal history of other diseases of the musculoskeletal system and connective tissue: Secondary | ICD-10-CM | POA: Diagnosis not present

## 2014-10-12 HISTORY — DX: Personal history of other diseases of the musculoskeletal system and connective tissue: Z87.39

## 2014-10-12 HISTORY — DX: Bipolar disorder, unspecified: F31.9

## 2014-10-12 HISTORY — DX: Gastro-esophageal reflux disease without esophagitis: K21.9

## 2014-10-12 HISTORY — DX: Essential (primary) hypertension: I10

## 2014-10-12 LAB — POC URINE PREG, ED: Preg Test, Ur: NEGATIVE

## 2014-10-12 MED ORDER — HYDROCODONE-ACETAMINOPHEN 5-325 MG PO TABS
1.0000 | ORAL_TABLET | ORAL | Status: AC
  Administered 2014-10-12: 1 via ORAL
  Filled 2014-10-12: qty 1

## 2014-10-12 NOTE — ED Notes (Signed)
Pt still trying to get urine sample. Pt states pain is a 12/10, when pt informed nothing over a 10 she states birthing is worse but this is still a 12.

## 2014-10-12 NOTE — ED Notes (Signed)
Pt states she fell x 2 days ago and is c/o right leg pain;

## 2014-10-12 NOTE — ED Provider Notes (Signed)
CSN: [id number]   Arrival date & time 10/12/14 2128  History  This chart was scribed for Kathleen Rank, MD by Altamease Oiler, ED Scribe. This patient was seen in room APA17/APA17 and the patient's care was started at 10:19 PM.  Chief Complaint  Patient presents with  . Fall    HPI The history is provided by the patient. No language interpreter was used.   [name] is a 39 y.o. female  With PMHx of DDD and HTN who presents to the Emergency Department complaining of multiple falls since early august with the last being 2 days ago. She associates her symptoms with LE pain secondary to DDD. She has been seen by her PCP and multiple specialists for the pain and cautioned to use a walker but continues to fall. Most recently she fell to her right side. Associated symptoms include right leg pain from the ankle to the hip.The pain is worse at the calf. She notes having right calf pain prior to the fall that was exacerbated after the incident. Standing on the leg exacerbates the pain. Pt denies head injury and LOC.   Past Medical History  Diagnosis Date  . History of degenerative disc disease   . Hypertension   . GERD (gastroesophageal reflux disease)   . Bipolar 1 disorder   . Manic depression     Past Surgical History  Procedure Laterality Date  . Back surgery    . Ankle surgery Left   . Abdominal surgery    . Colonoscopy    . Tubal ligation      History reviewed. No pertinent family history.  Social History  Substance Use Topics  . Smoking status: Current Every Day Smoker -- 0.50 packs/day  . Smokeless tobacco: None  . Alcohol Use: No     Review of Systems  Musculoskeletal: Positive for myalgias and arthralgias.       Right leg pain   10 Systems reviewed and all are negative for acute change except as noted in the HPI.  Home Medications   Prior to Admission medications   Medication Sig Start Date End Date Taking? Authorizing Provider  ALPRAZolam Duanne Moron) 1 MG tablet Take 1  mg by mouth 3 (three) times daily as needed for anxiety.   Yes Historical Provider, MD  amitriptyline (ELAVIL) 50 MG tablet Take 50 mg by mouth at bedtime.   Yes Historical Provider, MD  buprenorphine-naloxone (SUBOXONE) 8-2 MG SUBL SL tablet Place 1 tablet under the tongue 3 (three) times daily.   Yes Historical Provider, MD  Dexlansoprazole 30 MG capsule Take 30 mg by mouth daily.   Yes Historical Provider, MD  etodolac (LODINE) 200 MG capsule Take 200 mg by mouth 2 (two) times daily.   Yes Historical Provider, MD  ferrous sulfate 325 (65 FE) MG tablet Take 325 mg by mouth daily with breakfast.   Yes Historical Provider, MD  lisinopril-hydrochlorothiazide (PRINZIDE,ZESTORETIC) 20-12.5 MG per tablet Take 1 tablet by mouth daily.   Yes Historical Provider, MD  traMADol (ULTRAM) 50 MG tablet Take 1 tablet (50 mg total) by mouth every 6 (six) hours as needed. 10/13/14   Kathleen Rank, MD    Allergies  Review of patient's allergies indicates no known allergies.  Triage Vitals: BP 122/76 mmHg  Pulse 86  Temp(Src) 98 F (36.7 C) (Oral)  Resp 20  Ht 5\' 1"  (1.549 m)  Wt 237 lb 12.8 oz (107.865 kg)  BMI 44.95 kg/m2  SpO2 99%  LMP 10/02/2014  Physical  Exam  Constitutional: She appears well-developed and well-nourished. No distress.  HENT:  Head: Normocephalic and atraumatic.  Right Ear: External ear normal.  Left Ear: External ear normal.  Eyes: Conjunctivae are normal. Right eye exhibits no discharge. Left eye exhibits no discharge. No scleral icterus.  Neck: Neck supple. No tracheal deviation present.  Cardiovascular: Normal rate.   Pulmonary/Chest: Effort normal. No stridor. No respiratory distress.  Musculoskeletal: She exhibits no edema.       Right hip: She exhibits tenderness and bony tenderness. She exhibits no swelling and no deformity.       Right knee: She exhibits no swelling and no effusion. Tenderness found.       Lumbar back: She exhibits tenderness and bony tenderness. She  exhibits no deformity.       Right foot: There is tenderness and bony tenderness. There is no swelling and no deformity.  Neurological: She is alert. Cranial nerve deficit: no gross deficits.  Skin: Skin is warm and dry. No rash noted.  Psychiatric: She has a normal mood and affect.  Nursing note and vitals reviewed.   ED Course  Procedures   DIAGNOSTIC STUDIES: Oxygen Saturation is 99% on RA, normal by my interpretation.    COORDINATION OF CARE: 10:22 PM Discussed treatment plan which includes XRs with pt at bedside and pt agreed to plan.  Labs Reviewed  POC URINE PREG, ED    Imaging Review Dg Lumbar Spine Complete  10/13/2014   CLINICAL DATA:  Tripped and fell 2 days ago, complaining of RIGHT low back pain, pain at RIGHT hip, RIGHT knee and RIGHT lower leg, history degenerative disc disease, hypertension, smoking  EXAM: LUMBAR SPINE - COMPLETE 4+ VIEW  COMPARISON:  10/24/2012  FINDINGS: Five non-rib-bearing lumbar vertebra.  Disc space narrowing and endplate spur formation L4-L5.  Vertebral body and disc space heights otherwise maintained.  Mild retrolisthesis L5-S1 unchanged.  No acute fracture, subluxation or bone destruction.  SI joints symmetric.  Numerous pelvic phleboliths.  Surgical clips RIGHT upper quadrant question cholecystectomy.  IMPRESSION: Degenerative disc disease changes L4-L5.  No acute abnormalities.   Electronically Signed   By: Lavonia Dana M.D.   On: 10/13/2014 00:06   Dg Tibia/fibula Right  10/13/2014   CLINICAL DATA:  Tripped and fell 2 days ago, complaining of pain at RIGHT lower back, RIGHT hip and RIGHT lower leg, history degenerative disc disease  EXAM: RIGHT TIBIA AND FIBULA - 2 VIEW  COMPARISON:  RIGHT knee radiographs 09/26/2013  FINDINGS: Osseous mineralization normal.  Joint spaces preserved.  No definite fracture, dislocation, or bone destruction.  Scattered phleboliths at the anterior mid lower leg.  Mild distal lower leg soft tissue swelling.  Mild  patellofemoral degenerative changes with joint space narrowing and spur formation.  IMPRESSION: No acute osseous abnormalities.   Electronically Signed   By: Lavonia Dana M.D.   On: 10/13/2014 00:08   Dg Ankle Complete Right  10/13/2014   CLINICAL DATA:  Tripped and fell 2 days ago, pain at RIGHT lower back, RIGHT hip, RIGHT knee and RIGHT lower leg, history degenerative disc disease  EXAM: RIGHT ANKLE - COMPLETE 3+ VIEW  COMPARISON:  None  FINDINGS: Osseous mineralization normal.  Joint spaces preserved.  Small spur at tip of medial malleolus.  No acute fracture, dislocation, or bone destruction.  IMPRESSION: Normal exam.   Electronically Signed   By: Lavonia Dana M.D.   On: 10/13/2014 00:09   Dg Knee Complete 4 Views Right  10/13/2014  CLINICAL DATA:  Trip and fall injury 2 days ago. Pain in the right lower back, right hip, right knee, and right lower leg.  EXAM: RIGHT KNEE - COMPLETE 4+ VIEW  COMPARISON:  09/26/2013  FINDINGS: Mild degenerative narrowing and hypertrophic change in the medial compartment. No evidence of acute fracture or dislocation in the right knee. No focal bone lesion or bone destruction. Bone cortex and trabecular architecture appear normal. No significant effusion.  IMPRESSION: Mild degenerative changes in the medial compartment. No acute bony abnormalities.   Electronically Signed   By: Lucienne Capers M.D.   On: 10/13/2014 00:08   Dg Hip Unilat With Pelvis 2-3 Views Right  10/13/2014   CLINICAL DATA:  Tripped and fell 2 days ago, complaining of RIGHT lower back, RIGHT hip, RIGHT knee and RIGHT lower leg pain, history degenerative disc disease  EXAM: DG HIP (WITH OR WITHOUT PELVIS) 2-3V RIGHT  COMPARISON:  None  FINDINGS: Osseous mineralization normal.  Hip and SI joint spaces symmetric and preserved.  No acute fracture, dislocation or bone destruction.  Pelvis intact.  Numerous pelvic phleboliths.  IMPRESSION: No acute RIGHT hip abnormalities.   Electronically Signed   By: Lavonia Dana M.D.   On: 10/13/2014 00:07     MDM   Final diagnoses:  Fall, initial encounter  Sprain   Pt has chronic pain syndrome associated with chronic back pain issues.  No obvious injury noted externally.  Will check xrays to rule out fx.  Follow up with her PCP regarding her chronic pain issues.  xrays negative.  Dc home with pain meds   I personally performed the services described in this documentation, which was scribed in my presence.  The recorded information has been reviewed and is accurate.    Kathleen Rank, MD 10/13/14 [phone]

## 2014-10-12 NOTE — ED Notes (Signed)
Pt c/o multiple falls, last one occuring a few days ago due to DDD, reports that her legs give out on her, uses a cane for assistance, pt c/o pain to left knee, right lower leg, has ace bandage on left lower leg from a fall in august,

## 2014-10-13 MED ORDER — TRAMADOL HCL 50 MG PO TABS: 50.0000 mg | ORAL_TABLET | Freq: Four times a day (QID) | ORAL | Status: DC | PRN

## 2014-10-13 NOTE — Discharge Instructions (Signed)
Ligament Sprain Ligaments are tough, fibrous tissues that hold bones together at the joints. A sprain can occur when a ligament is stretched. This injury may take several weeks to heal. [place] the injured area for as long as directed by your caregiver. Then slowly start using the joint as directed by your caregiver and as the pain allows.  Keep the affected joint raised if possible to lessen swelling.  Apply ice for 15-20 minutes to the injured area every couple hours for the first half day, then 03-04 times per day for the first 48 hours. Put the ice in a plastic bag and place a towel between the bag of ice and your skin.  Wear any splinting, casting, or elastic bandage applications as instructed.  Only take over-the-counter or prescription medicines for pain, discomfort, or fever as directed by your caregiver. Do not use aspirin immediately after the injury unless instructed by your caregiver. Aspirin can cause increased bleeding and bruising of the tissues.  If you were given crutches, continue to use them as instructed and do not resume weight bearing on the affected extremity until instructed. SEEK MEDICAL CARE IF:   Your bruising, swelling, or pain increases.  You have cold and numb fingers or toes if your arm or leg was injured. SEEK IMMEDIATE MEDICAL CARE IF:   Your toes are numb or blue if your leg was injured.  Your fingers are numb or blue if your arm was injured.  Your pain is not responding to medicines and continues to stay the same or gets worse. MAKE SURE YOU:   Understand these instructions.  Will watch your condition.  Will get help right away if you are not doing well or get worse. Document Released: 01/05/2000 Document Revised: 04/01/2011 Document Reviewed: 11/03/2007 [facility] Patient Information 2015 [place], [place]. This information is not intended to replace advice given to you by your health care provider. Make sure you discuss any  questions you have with your health care provider.

## 2014-10-13 NOTE — ED Notes (Signed)
Pt alert & oriented x4. Patient given discharge instructions, paperwork & prescription(s). Patient verbalized understanding. Pt left department in wheelchair escorted by staff. Pt had no further questions.

## 2014-11-09 ENCOUNTER — Emergency Department (HOSPITAL_COMMUNITY)
Admission: EM | Admit: 2014-11-09 | Discharge: 2014-11-09 | Disposition: A | Discharge status: Home / Self Care | Payer: Medicare Other | Attending: Emergency Medicine | Admitting: Emergency Medicine

## 2014-11-09 ENCOUNTER — Encounter (HOSPITAL_COMMUNITY): Payer: Self-pay | Admitting: Emergency Medicine

## 2014-11-09 DIAGNOSIS — K219 Gastro-esophageal reflux disease without esophagitis: Secondary | ICD-10-CM | POA: Diagnosis not present

## 2014-11-09 DIAGNOSIS — Z79891 Long term (current) use of opiate analgesic: Secondary | ICD-10-CM | POA: Insufficient documentation

## 2014-11-09 DIAGNOSIS — G8929 Other chronic pain: Secondary | ICD-10-CM | POA: Diagnosis not present

## 2014-11-09 DIAGNOSIS — M545 Low back pain: Secondary | ICD-10-CM | POA: Diagnosis present

## 2014-11-09 DIAGNOSIS — M549 Dorsalgia, unspecified: Secondary | ICD-10-CM | POA: Diagnosis not present

## 2014-11-09 DIAGNOSIS — R200 Anesthesia of skin: Secondary | ICD-10-CM | POA: Diagnosis not present

## 2014-11-09 DIAGNOSIS — I10 Essential (primary) hypertension: Secondary | ICD-10-CM | POA: Diagnosis not present

## 2014-11-09 DIAGNOSIS — Z79899 Other long term (current) drug therapy: Secondary | ICD-10-CM | POA: Insufficient documentation

## 2014-11-09 DIAGNOSIS — Z720 Tobacco use: Secondary | ICD-10-CM | POA: Diagnosis not present

## 2014-11-09 DIAGNOSIS — R531 Weakness: Secondary | ICD-10-CM | POA: Diagnosis not present

## 2014-11-09 DIAGNOSIS — Z8739 Personal history of other diseases of the musculoskeletal system and connective tissue: Secondary | ICD-10-CM | POA: Insufficient documentation

## 2014-11-09 DIAGNOSIS — Z791 Long term (current) use of non-steroidal anti-inflammatories (NSAID): Secondary | ICD-10-CM | POA: Diagnosis not present

## 2014-11-09 DIAGNOSIS — F319 Bipolar disorder, unspecified: Secondary | ICD-10-CM | POA: Diagnosis not present

## 2014-11-09 MED ORDER — DEXAMETHASONE SODIUM PHOSPHATE 4 MG/ML IJ SOLN
8.0000 mg | Freq: Once | INTRAMUSCULAR | Status: AC
  Administered 2014-11-09: 8 mg via INTRAMUSCULAR
  Filled 2014-11-09: qty 2

## 2014-11-09 MED ORDER — KETOROLAC TROMETHAMINE 60 MG/2ML IM SOLN
60.0000 mg | Freq: Once | INTRAMUSCULAR | Status: AC
  Administered 2014-11-09: 60 mg via INTRAMUSCULAR
  Filled 2014-11-09: qty 2

## 2014-11-09 MED ORDER — OXYCODONE-ACETAMINOPHEN 5-325 MG PO TABS: 2.0000 | ORAL_TABLET | Freq: Once | ORAL | Status: DC

## 2014-11-09 NOTE — ED Provider Notes (Signed)
CSN: [id number]     Arrival date & time 11/09/14  1022 History   By signing my name below, I, [name], attest that this documentation has been prepared under the direction and in the presence of Arlean Hopping, PA-C Electronically Signed: Ladene Artist, ED Scribe 11/09/2014 at 11:11 AM.   Chief Complaint  Patient presents with  . Back Pain  . Leg Pain   The history is provided by the patient. No language interpreter was used.   HPI Comments: [name] is a 39 y.o. female, with a h/o chronic back pain and DDD, who presents to the Emergency Department complaining of chronic lower back pain for the past few weeks. Pt reports frequent falls overs the past 3 weeks due to her knees "giving out" which she suspects has worsened pain; most recent fall was September 23, for which she was seen in the ED. She describes back pain as constant, 10/10 burning sensation that radiates into the back of her lower extremities. Pain is exacerbated with movement and ambulating. She reports associated numbness/tingling and lower extremity weakness. All pain and accompanying symptoms are chronic for this patient with no acute changes. Pt has tried elevating her legs and warm baths with alcohol without signigicant relief. She denies fever, chills, abdominal pain, nausea, vomiting, diarrhea, blood in stools, abdominal pain, dysuria, bladder or bowel incontinence. Denies vaginal discharge, saddle anesthesias, or increased difficulty ambulating. Pt has had XRs done within the past 3 weeks which showed DDD. She was seen by PCP at [facility] and was supposed to be referred to neurosurgery but states that she has not received a call yet. Pt is seen at a [facility] clinic every Friday; her last visit was 5 days ago. She denies abuse of pain medications within the past 5 years.  Pt uses a walker or cane for ambulation.  Past Medical History  Diagnosis Date  . History of degenerative disc disease   . Hypertension    . GERD (gastroesophageal reflux disease)   . Bipolar 1 disorder ([place])   . Manic depression ([place])    Past Surgical History  Procedure Laterality Date  . Back surgery    . Ankle surgery Left   . Abdominal surgery    . Colonoscopy    . Tubal ligation     No family history on file. Social History  Substance Use Topics  . Smoking status: Current Every Day Smoker -- 0.50 packs/day  . Smokeless tobacco: None  . Alcohol Use: No   OB History    No data available     Review of Systems  Constitutional: Negative for fever and chills.  Respiratory: Negative for shortness of breath.   Cardiovascular: Negative for chest pain.  Gastrointestinal: Negative for nausea, vomiting, abdominal pain, diarrhea and blood in stool.  Genitourinary: Negative for dysuria, urgency, frequency, hematuria, flank pain, vaginal bleeding, vaginal discharge, vaginal pain and pelvic pain.  Musculoskeletal: Positive for back pain. Negative for neck pain and neck stiffness.  Skin: Negative for color change.  Neurological: Positive for weakness and numbness. Negative for dizziness, syncope, light-headedness and headaches.  All other systems reviewed and are negative.  Allergies  Review of patient's allergies indicates no known allergies.  Home Medications   Prior to Admission medications   Medication Sig Start Date End Date Taking? Authorizing Provider  ALPRAZolam Duanne Moron) 1 MG tablet Take 1 mg by mouth 3 (three) times daily as needed for anxiety.    Historical Provider, MD  amitriptyline (  ELAVIL) 50 MG tablet Take 50 mg by mouth at bedtime.    Historical Provider, MD  buprenorphine-naloxone (SUBOXONE) 8-2 MG SUBL SL tablet Place 1 tablet under the tongue 3 (three) times daily.    Historical Provider, MD  Dexlansoprazole 30 MG capsule Take 30 mg by mouth daily.    Historical Provider, MD  etodolac (LODINE) 200 MG capsule Take 200 mg by mouth 2 (two) times daily.    Historical Provider, MD  ferrous sulfate 325  (65 FE) MG tablet Take 325 mg by mouth daily with breakfast.    Historical Provider, MD  lisinopril-hydrochlorothiazide (PRINZIDE,ZESTORETIC) 20-12.5 MG per tablet Take 1 tablet by mouth daily.    Historical Provider, MD  traMADol (ULTRAM) 50 MG tablet Take 1 tablet (50 mg total) by mouth every 6 (six) hours as needed. 10/13/14   [name], MD   BP 151/105 mmHg  Pulse 98  Temp(Src) 98 F (36.7 C) (Oral)  Resp 20  SpO2 100%  LMP 10/02/2014 Physical Exam  Constitutional: She is oriented to person, place, and time. She appears well-developed and well-nourished. No distress.  HENT:  Head: Normocephalic and atraumatic.  Eyes: Conjunctivae and EOM are normal. Pupils are equal, round, and reactive to light.  Neck: Normal range of motion. Neck supple.  Cardiovascular: Normal rate, regular rhythm and normal heart sounds.   Pulmonary/Chest: Effort normal and breath sounds normal. No respiratory distress.  Abdominal: Soft. Bowel sounds are normal.  Musculoskeletal: Normal range of motion. She exhibits no edema or tenderness.  Grip strength 5/5 bilaterally. UE strength 5/5. 4/5 strength in feet.   Neurological: She is alert and oriented to person, place, and time. No sensory deficit.  Sensory intact; equal bilaterally. Cranial nerves II-XII grossly intact.  Skin: Skin is warm and dry. She is not diaphoretic.  Psychiatric: She has a normal mood and affect. Her behavior is normal.  Nursing note and vitals reviewed.  ED Course  Procedures (including critical care time) DIAGNOSTIC STUDIES: Oxygen Saturation is 100% on RA, normal by my interpretation.    COORDINATION OF CARE: 11:08 AM-Discussed treatment plan with pt at bedside and pt agreed to plan.   Labs Review Labs Reviewed - No data to display  Imaging Review No results found. I have personally reviewed and evaluated these images and lab results as part of my medical decision-making.   EKG Interpretation None      MDM   Final  diagnoses:  Chronic back pain    ROBINA [name] presents with chronic lower back pain and chronic bilateral leg weakness.  When asked, pt states her main goal is to get a referral to neurosurgery. Previous imaging done in September was free from abnormalities other than degenerative disc disease. Since pt goes to a suboxone clinic and has a pain contract, pain prescriptions are not indicated. Pt to have pain management here in the ED and then discharged with referral to neurosurgery. Pt has no red flag complaints and assures me all of her complaints have been going on for more than a year. Pt offered percocet for pain control in ED, but refused and asked for dilaudid by name. Pt is a fall risk with a history of recent falls and a strong narcotic like dilaudid is not advisable. RN present during conversation regarding pain medication. Pt offered Toradol IM or Percocet. Pt finally agreed to toradol. Decadron also ordered. Pt had a recent bmp that showed normal renal function.  Pt made the comment that we were not  listening to her about her pain. It was explained to pt that it was understood that she has pain and because of that we are offering the strongest pain management that we could offer her given her fall history and risks. Pt states, "Just give me some dilaudid, I'm going straight home and laying down," however, RN then overheard pt talking on the phone telling them she would be up to see them upon discharge.     I personally performed the services described in this documentation, which was scribed in my presence. The recorded information has been reviewed and is accurate.    Lorayne Bender, PA-C 11/09/14 [place], MD 11/10/14 [phone]

## 2014-11-09 NOTE — ED Notes (Signed)
Patient states chronic back pain with history of back surgery and degenerative disc disease.  Patient complaining of sciatic pain with history of same.   Patient has been to primary doctor for same and has been referred out to neurosurgery, but she states that noone has called her back.   Patient states recent xrays in the last 3 weeks for frequent falls.

## 2014-11-09 NOTE — Discharge Instructions (Signed)
You have been seen today for chronic back pain and have requested a neurosurgery referral.  You are being discharged with a referral to neurosurgery.  Contact them as soon as possible to set up an appointment.    Emergency Department Resource Guide 1) Find a Doctor and Pay Out of Pocket Although you won't have to find out who is covered by your insurance plan, it is a good idea to ask around and get recommendations. You will then need to call the office and see if the doctor you have chosen will accept you as a new patient and what types of options they offer for patients who are self-pay. Some doctors offer discounts or will set up payment plans for their patients who do not have insurance, but you will need to ask so you aren't surprised when you get to your appointment.  2) Contact Your Local Health Department Not all health departments have doctors that can see patients for sick visits, but many do, so it is worth a call to see if yours does. If you don't know where your local health department is, you can check in your phone book. The CDC also has a tool to help you locate your state's health department, and many state websites also have listings of all of their local health departments.  3) Find a [facility] If your illness is not likely to be very severe or complicated, you may want to try a walk in clinic. These are popping up all over the country in pharmacies, drugstores, and shopping centers. They're usually staffed by nurse practitioners or physician assistants that have been trained to treat common illnesses and complaints. They're usually fairly quick and inexpensive. However, if you have serious medical issues or chronic medical problems, these are probably not your best option.  No Primary Care Doctor: - Call Health Connect at  [phone] - they can help you locate a primary care doctor that  accepts your insurance, provides certain services, etc. - Physician Referral Service-  [phone]  Chronic Pain Problems: Organization         Address  Phone   Notes  [facility]  [phone] Patients need to be referred by their primary care doctor.   Medication Assistance: Organization         Address  Phone   Notes  [facility] Medication [facility] [address] [place], [place] [address] [phone] --Must be a resident of [facility] -- Must have NO insurance coverage whatsoever (no Medicaid/ Medicare, etc.) -- The pt. MUST have a primary care doctor that directs their care regularly and follows them in the community   MedAssist  [phone]   [employer]  [phone]    Agencies that provide inexpensive medical care: Organization         Address  Phone   Notes  [place]  [phone]   [facility]    [phone]   [facility] [place], [place] [address] [phone]   [place] [address] [place] [phone]   Planned Parenthood    [phone]   [facility]    [phone]   [place] and [address], [place] Phone:  [phone], Fax:  [phone] Hours of Operation:  9 am - 6 pm, M-F.  Also accepts Medicaid/Medicare and self-pay.  [facility] for [place] [address], [address], [place] Phone: [phone], Fax: [phone]. Hours of Operation:  8:30 am - 5:30 pm, M-F.  Also accepts Medicaid and self-pay.  [facility] [place] [address] [place] Phone: [phone]   [place], [place], [place] [phone], Ext. [phone] Mondays & Thursdays: 7-9 AM.  First 15 patients are seen on a first come, first serve basis.    [place] Providers:  Organization         Address  Phone   Notes  [facility] [address],  [place] [phone] Also accepts self-pay patients.  [facility] [id number] [place], [place]  [phone]   [place], [address], [place] [phone]   [facility] [address] [place] [phone]   Lucianne Lei [address], [place]   [phone] Only accepts [place] Access [place] patients after they have their name applied to their card.   Self-Pay (no insurance) in [facility]:  Organization         Address  Phone   Notes  Sickle Cell Patients, [facility] [place] [phone]   [facility] [place] [phone]   [facility] [place]  [place], [place], [place] [phone]   [facility]/Dr. Osei-Bonsu  [address] [place] [address] [place] or [address], [place] [phone] Phone number for both [place] and [place] locations is the same.  Urgent Medical and [facility] [address], [place] [phone]   [facility] [address] [place] or [address] [phone]   [facility] [address], [place] [phone], phone; [phone], fax Sees patients 1st and 3rd Saturday of every month.  Must not qualify for public or private insurance (i.e. Medicaid, Medicare, [place] Health Choice, Veterans' Benefits)  Household income should be no more than 200% of the poverty level The clinic cannot treat you if you are pregnant or think you are pregnant  Sexually transmitted diseases are not treated at the clinic.    Dental Care: Organization         Address  Phone  Notes  [facility] Department of [facility] [place] [phone] Accepts children up to age 81 who are enrolled in  [place] or [place]; pregnant women with a Medicaid card; and children who have applied for Medicaid or [place] Health Choice, but were declined, whose parents can pay a reduced fee at time of service.  [facility] Department of [facility]  [address], [place] [phone] Accepts children up to age 44 who are enrolled in [place] or [place]; pregnant women with a Medicaid card; and children who have applied for Medicaid or [place] Health Choice, but were declined, whose parents can pay a reduced fee at time of service.  [facility] PRO[facility]  [place] [phone] Patients are seen by appointment only. Walk-ins are not accepted. [place] will see patients 52 years of age and older. Monday - Tuesday (8am-5pm) Most Wednesdays (8:30-5pm) $30 per  visit, cash only  [facility] Adult [employer] PROGRAM  [address], [address] [phone] Patients are seen by appointment only. Walk-ins are not accepted. [place] will see patients 48 years of age and older. One Wednesday Evening (Monthly: Volunteer Based).  $30 per visit, cash only  [place]  [phone] for adults; Children under age 62, call Graduate Pediatric Dentistry at [phone]. Children aged 41-14, please call [phone] to request a pediatric application.  Dental services are provided in all areas of dental care including fillings, crowns and bridges, complete and partial dentures, implants, gum treatment, root canals, and extractions. Preventive care is also provided. Treatment is provided to both adults and children. Patients are selected via a lottery and there is often a waiting list.   [facility] [address] [place]  [phone] [email or web address]   [facility] [address] [place], [place] [phone], Ext. [phone] Second and Fourth Thursday of each month, opens at 6:30  AM; Clinic ends at 9 AM.  Patients are seen on a first-come first-served basis, and a limited number are seen during each clinic.   [facility]  [address] [place] Dank[place], [place] [phone]   Eligibility Requirements You must have lived in [place], [place], or [place] counties for at least the last three months.   You cannot be eligible for state or federal sponsored [employer], including [employer], [place], or [employer].   You generally cannot be eligible for healthcare insurance through your employer.    How to apply: Eligibility screenings are held every Tuesday and Wednesday afternoon from 1:00 pm until 4:00 pm. You do not need an appointment for the interview!  [facility] [address], [place], [place]   [place]  [place] Department  [place]  [phone]    Behavioral Health Resources in the Community: Intensive Outpatient Programs Organization         Address  Phone  Notes  [place]. [address] [place], [place] [phone]   [facility] Outpatient [address] [place], [place]   ADS: Alcohol & Drug Svcs [address] [place], [place]   [place] [address]  [place], [place] or [phone]   Substance Abuse Resources Organization         Address  Phone  Notes  Alcohol and Drug Services  [phone]   [place]  [phone]   The [place]   Chinita Pester  [phone]   Residential & Outpatient Substance Abuse Program  [phone]   Psychological Services Organization         Address  Phone  Notes  [facility] [place]  [place]  [phone]   [place] [address] [place] or  [phone]    Mobile Crisis Teams Organization         Address  Phone  Notes  [facility], Mobile Crisis Care Unit  [phone]   [facility]  [address] [place], [place]   [place] [address], [place] [phone]    Self-Help/Support Groups Organization         Address  Phone  Notes  Mental Health Assoc. of [place] - variety of support groups  [place] Call for more information  Narcotics Anonymous (NA), [facility] [address], [employer] [place]  2 meetings at this location   Special educational needs teacher         Address  Phone  Notes  ASAP Residential Treatment [place],    [place]  [phone]   [facility]  [address] [place] [id number], [place], [place]   [place], [place] [phone] Admissions: 8am-3pm M-F  Incentives Substance [place] [address]    [place], [place] [phone]   [facility] [address] [place], [place], [place]   [facility] [address],  [place], [place]   [facility] - Intensive Outpatient [address], [place], [place]   [facility] [address]  [place], [place] [phone] or [phone]   [facility] Services (RTS) [address] [place], [place] Accepts Medicaid  [facility] [place] [address],  [place] [phone] Substance Abuse/Addiction Treatment   [facility] Organization         Address  Phone  Notes  [facility]  [phone]   Domenic Schwab, PhD [address] [place]lis [place], [place]   [phone] or [phone]   [place] and [place], [place] [phone]   [facility] [address],  [place], [place] [phone] Insurance/Medicaid/sponsorship through [facility] and Families [address], [address] [place]                                    [place], [place] [phone] [place] [address] [place], [place] [phone]    Dr. Adele Schilder  [phone]   [facility] Dept. 1) [address], [place] 2) [place] 3)  [place] [address], [place] [phone]  [phone]   [place] [phone] or [phone] (After Hours)

## 2014-11-09 NOTE — ED Notes (Signed)
Declined W/C at D/C and was escorted to lobby by RN. 

## 2014-11-09 NOTE — ED Notes (Signed)
Patient states she doesn't want Percocet.   She states that the "pills won't work.   Cant I have a shot of dilaudid or something"?

## 2015-08-03 ENCOUNTER — Other Ambulatory Visit: Payer: Self-pay | Admitting: Neurosurgery

## 2015-08-10 ENCOUNTER — Inpatient Hospital Stay (HOSPITAL_COMMUNITY)
Admission: RE | Admit: 2015-08-10 | Discharge: 2015-08-10 | Disposition: A | Discharge status: Home / Self Care | Payer: Medicare Other | Source: Ambulatory Visit

## 2015-08-10 ENCOUNTER — Encounter (HOSPITAL_COMMUNITY): Payer: Self-pay

## 2015-08-10 ENCOUNTER — Other Ambulatory Visit (HOSPITAL_COMMUNITY): Payer: Self-pay | Admitting: *Deleted

## 2015-08-10 HISTORY — DX: Fibromyalgia: M79.7

## 2015-08-10 HISTORY — DX: Unspecified osteoarthritis, unspecified site: M19.90

## 2015-08-10 NOTE — Progress Notes (Signed)
Called patient as a SDW because she could not make her preadmission appointment today.  Patient verbalized that she used marijuana about 5 days ago.  Told patient not to use any more before surgery  Chart sent for review to anesthesia

## 2015-08-16 ENCOUNTER — Ambulatory Visit (HOSPITAL_COMMUNITY)
Admission: RE | Admit: 2015-08-16 | Discharge: 2015-08-17 | Disposition: A | Discharge status: Home / Self Care | Payer: Medicare Other | Source: Ambulatory Visit | Attending: Neurosurgery | Admitting: Neurosurgery

## 2015-08-16 ENCOUNTER — Encounter (HOSPITAL_COMMUNITY)
Admission: RE | Disposition: A | Discharge status: Home / Self Care | Payer: Self-pay | Source: Ambulatory Visit | Attending: Neurosurgery

## 2015-08-16 ENCOUNTER — Ambulatory Visit (HOSPITAL_COMMUNITY): Payer: Medicare Other

## 2015-08-16 ENCOUNTER — Ambulatory Visit (HOSPITAL_COMMUNITY): Payer: Medicare Other | Admitting: Certified Registered Nurse Anesthetist

## 2015-08-16 ENCOUNTER — Ambulatory Visit (HOSPITAL_COMMUNITY): Payer: Medicare Other | Admitting: Emergency Medicine

## 2015-08-16 ENCOUNTER — Encounter (HOSPITAL_COMMUNITY): Payer: Self-pay | Admitting: *Deleted

## 2015-08-16 ENCOUNTER — Ambulatory Visit (HOSPITAL_COMMUNITY): Payer: Medicare Other | Admitting: Anesthesiology

## 2015-08-16 DIAGNOSIS — I10 Essential (primary) hypertension: Secondary | ICD-10-CM | POA: Diagnosis not present

## 2015-08-16 DIAGNOSIS — M1990 Unspecified osteoarthritis, unspecified site: Secondary | ICD-10-CM | POA: Diagnosis not present

## 2015-08-16 DIAGNOSIS — K219 Gastro-esophageal reflux disease without esophagitis: Secondary | ICD-10-CM | POA: Insufficient documentation

## 2015-08-16 DIAGNOSIS — M961 Postlaminectomy syndrome, not elsewhere classified: Secondary | ICD-10-CM | POA: Insufficient documentation

## 2015-08-16 DIAGNOSIS — M797 Fibromyalgia: Secondary | ICD-10-CM | POA: Diagnosis not present

## 2015-08-16 DIAGNOSIS — F17200 Nicotine dependence, unspecified, uncomplicated: Secondary | ICD-10-CM | POA: Insufficient documentation

## 2015-08-16 DIAGNOSIS — Z419 Encounter for procedure for purposes other than remedying health state, unspecified: Secondary | ICD-10-CM

## 2015-08-16 HISTORY — PX: LUMBAR WOUND DEBRIDEMENT: SHX1988

## 2015-08-16 HISTORY — PX: SPINAL CORD STIMULATOR INSERTION: SHX5378

## 2015-08-16 LAB — CBC
HCT: 40.3 % (ref 36.0–46.0)
HEMOGLOBIN: 13.1 g/dL (ref 12.0–15.0)
MCH: 24.8 pg — ABNORMAL LOW (ref 26.0–34.0)
MCHC: 32.5 g/dL (ref 30.0–36.0)
MCV: 76.2 fL — ABNORMAL LOW (ref 78.0–100.0)
PLATELETS: 234 10*3/uL (ref 150–400)
RBC: 5.29 MIL/uL — AB (ref 3.87–5.11)
RDW: 16.6 % — ABNORMAL HIGH (ref 11.5–15.5)
WBC: 5.1 10*3/uL (ref 4.0–10.5)

## 2015-08-16 LAB — HEPATIC FUNCTION PANEL
ALBUMIN: 3.3 g/dL — AB (ref 3.5–5.0)
ALT: 18 U/L (ref 14–54)
AST: 18 U/L (ref 15–41)
Alkaline Phosphatase: 60 U/L (ref 38–126)
Bilirubin, Direct: <0.1 mg/dL — ABNORMAL LOW (ref 0.1–0.5)
TOTAL PROTEIN: 6.4 g/dL — AB (ref 6.5–8.1)
Total Bilirubin: 0.3 mg/dL (ref 0.3–1.2)

## 2015-08-16 LAB — BASIC METABOLIC PANEL
ANION GAP: 7 (ref 5–15)
BUN: 9 mg/dL (ref 6–20)
CALCIUM: 8.8 mg/dL — AB (ref 8.9–10.3)
CO2: 24 mmol/L (ref 22–32)
CREATININE: 0.82 mg/dL (ref 0.44–1.00)
Chloride: 106 mmol/L (ref 101–111)
Glucose, Bld: 80 mg/dL (ref 65–99)
Potassium: 3.9 mmol/L (ref 3.5–5.1)
SODIUM: 137 mmol/L (ref 135–145)

## 2015-08-16 LAB — HCG, SERUM, QUALITATIVE: PREG SERUM: NEGATIVE

## 2015-08-16 LAB — SURGICAL PCR SCREEN
MRSA, PCR: NEGATIVE
Staphylococcus aureus: POSITIVE — AB

## 2015-08-16 SURGERY — INSERTION, SPINAL CORD STIMULATOR, LUMBAR
Anesthesia: General | Site: Spine Lumbar

## 2015-08-16 SURGERY — LUMBAR WOUND DEBRIDEMENT
Anesthesia: General | Site: Spine Thoracic

## 2015-08-16 MED ORDER — ONDANSETRON HCL 4 MG/2ML IJ SOLN
INTRAMUSCULAR | Status: DC | PRN
  Administered 2015-08-16: 4 mg via INTRAVENOUS

## 2015-08-16 MED ORDER — MUPIROCIN 2 % EX OINT
1.0000 "application " | TOPICAL_OINTMENT | Freq: Once | CUTANEOUS | Status: AC
  Administered 2015-08-16: 1 via TOPICAL
  Filled 2015-08-16: qty 22

## 2015-08-16 MED ORDER — ALPRAZOLAM 0.5 MG PO TABS
1.0000 mg | ORAL_TABLET | Freq: Three times a day (TID) | ORAL | Status: DC | PRN
  Administered 2015-08-16 – 2015-08-17 (×2): 1 mg via ORAL
  Administered 2015-08-17: 0.5 mg via ORAL
  Filled 2015-08-16 (×3): qty 2

## 2015-08-16 MED ORDER — FENTANYL CITRATE (PF) 250 MCG/5ML IJ SOLN
INTRAMUSCULAR | Status: AC
Start: 2015-08-16 — End: 2015-08-16
  Filled 2015-08-16: qty 5

## 2015-08-16 MED ORDER — ROCURONIUM BROMIDE 50 MG/5ML IV SOLN
INTRAVENOUS | Status: AC
  Filled 2015-08-16: qty 1

## 2015-08-16 MED ORDER — HEMOSTATIC AGENTS (NO CHARGE) OPTIME
TOPICAL | Status: DC | PRN
  Administered 2015-08-16: 1 via TOPICAL

## 2015-08-16 MED ORDER — LISINOPRIL 20 MG PO TABS
20.0000 mg | ORAL_TABLET | Freq: Every day | ORAL | Status: DC
  Administered 2015-08-17: 20 mg via ORAL
  Filled 2015-08-16: qty 1

## 2015-08-16 MED ORDER — CEFAZOLIN SODIUM-DEXTROSE 2-3 GM-% IV SOLR
INTRAVENOUS | Status: DC | PRN
  Administered 2015-08-16: 2 g via INTRAVENOUS

## 2015-08-16 MED ORDER — PANTOPRAZOLE SODIUM 40 MG PO TBEC
40.0000 mg | DELAYED_RELEASE_TABLET | Freq: Every day | ORAL | Status: DC
  Filled 2015-08-16: qty 1

## 2015-08-16 MED ORDER — KETOROLAC TROMETHAMINE 30 MG/ML IJ SOLN
INTRAMUSCULAR | Status: DC | PRN
  Administered 2015-08-16: 30 mg via INTRAVENOUS

## 2015-08-16 MED ORDER — LACTATED RINGERS IV SOLN
INTRAVENOUS | Status: DC
  Administered 2015-08-16: 09:00:00 via INTRAVENOUS

## 2015-08-16 MED ORDER — MENTHOL 3 MG MT LOZG
1.0000 | LOZENGE | OROMUCOSAL | Status: DC | PRN
Start: 2015-08-16 — End: 2015-08-17

## 2015-08-16 MED ORDER — OXYCODONE HCL 5 MG PO TABS
5.0000 mg | ORAL_TABLET | ORAL | Status: DC | PRN
  Administered 2015-08-16: 10 mg via ORAL
  Administered 2015-08-17: 5 mg via ORAL
  Administered 2015-08-17: 10 mg via ORAL
  Administered 2015-08-17: 5 mg via ORAL
  Filled 2015-08-16: qty 2
  Filled 2015-08-16: qty 1
  Filled 2015-08-16 (×2): qty 2

## 2015-08-16 MED ORDER — KETOROLAC TROMETHAMINE 30 MG/ML IJ SOLN
30.0000 mg | Freq: Once | INTRAMUSCULAR | Status: AC
  Administered 2015-08-16: 30 mg via INTRAVENOUS

## 2015-08-16 MED ORDER — PHENYLEPHRINE HCL 10 MG/ML IJ SOLN
INTRAMUSCULAR | Status: DC | PRN
Start: 2015-08-16 — End: 2015-08-16
  Administered 2015-08-16: 80 ug via INTRAVENOUS

## 2015-08-16 MED ORDER — SODIUM CHLORIDE 0.9 % IV SOLN: 250.0000 mL | INTRAVENOUS | Status: DC

## 2015-08-16 MED ORDER — FENTANYL CITRATE (PF) 100 MCG/2ML IJ SOLN
INTRAMUSCULAR | Status: AC
  Filled 2015-08-16: qty 2

## 2015-08-16 MED ORDER — CEFAZOLIN SODIUM 1 G IJ SOLR
INTRAMUSCULAR | Status: AC
  Filled 2015-08-16: qty 20

## 2015-08-16 MED ORDER — SUGAMMADEX SODIUM 200 MG/2ML IV SOLN
INTRAVENOUS | Status: DC | PRN
  Administered 2015-08-16: 200 mg via INTRAVENOUS

## 2015-08-16 MED ORDER — FENTANYL CITRATE (PF) 250 MCG/5ML IJ SOLN
INTRAMUSCULAR | Status: AC
  Filled 2015-08-16: qty 5

## 2015-08-16 MED ORDER — POTASSIUM CHLORIDE IN NACL 20-0.9 MEQ/L-% IV SOLN
INTRAVENOUS | Status: DC
Start: 2015-08-16 — End: 2015-08-17
  Administered 2015-08-16: 1000 mL via INTRAVENOUS
  Filled 2015-08-16 (×3): qty 1000

## 2015-08-16 MED ORDER — SUGAMMADEX SODIUM 200 MG/2ML IV SOLN
INTRAVENOUS | Status: AC
  Filled 2015-08-16: qty 2

## 2015-08-16 MED ORDER — PROPOFOL 10 MG/ML IV BOLUS
INTRAVENOUS | Status: AC
  Filled 2015-08-16: qty 20

## 2015-08-16 MED ORDER — SODIUM CHLORIDE 0.9% FLUSH
3.0000 mL | Freq: Two times a day (BID) | INTRAVENOUS | Status: DC
  Administered 2015-08-16 – 2015-08-17 (×2): 3 mL via INTRAVENOUS

## 2015-08-16 MED ORDER — ROCURONIUM BROMIDE 50 MG/5ML IV SOLN
INTRAVENOUS | Status: AC
  Filled 2015-08-16: qty 2

## 2015-08-16 MED ORDER — MIDAZOLAM HCL 2 MG/2ML IJ SOLN
INTRAMUSCULAR | Status: AC
  Filled 2015-08-16: qty 2

## 2015-08-16 MED ORDER — MIDAZOLAM HCL 2 MG/2ML IJ SOLN
INTRAMUSCULAR | Status: DC | PRN
  Administered 2015-08-16: 2 mg via INTRAVENOUS

## 2015-08-16 MED ORDER — FENTANYL CITRATE (PF) 100 MCG/2ML IJ SOLN
25.0000 ug | INTRAMUSCULAR | Status: DC | PRN
  Administered 2015-08-16 (×4): 50 ug via INTRAVENOUS

## 2015-08-16 MED ORDER — PHENOL 1.4 % MT LIQD: 1.0000 | OROMUCOSAL | Status: DC | PRN

## 2015-08-16 MED ORDER — PROPOFOL 10 MG/ML IV BOLUS
INTRAVENOUS | Status: DC | PRN
  Administered 2015-08-16: 150 mg via INTRAVENOUS

## 2015-08-16 MED ORDER — LIDOCAINE 2% (20 MG/ML) 5 ML SYRINGE
INTRAMUSCULAR | Status: AC
  Filled 2015-08-16: qty 10

## 2015-08-16 MED ORDER — ONDANSETRON HCL 4 MG/2ML IJ SOLN
INTRAMUSCULAR | Status: AC
  Filled 2015-08-16: qty 2

## 2015-08-16 MED ORDER — ONDANSETRON HCL 4 MG/2ML IJ SOLN
INTRAMUSCULAR | Status: AC
  Filled 2015-08-16: qty 4

## 2015-08-16 MED ORDER — KETOROLAC TROMETHAMINE 30 MG/ML IJ SOLN
INTRAMUSCULAR | Status: AC
  Filled 2015-08-16: qty 1

## 2015-08-16 MED ORDER — HYDROCHLOROTHIAZIDE 12.5 MG PO CAPS
12.5000 mg | ORAL_CAPSULE | Freq: Every day | ORAL | Status: DC
  Administered 2015-08-17: 12.5 mg via ORAL
  Filled 2015-08-16: qty 1

## 2015-08-16 MED ORDER — FENTANYL CITRATE (PF) 250 MCG/5ML IJ SOLN
INTRAMUSCULAR | Status: DC | PRN
  Administered 2015-08-16: 50 ug via INTRAVENOUS
  Administered 2015-08-16: 100 ug via INTRAVENOUS

## 2015-08-16 MED ORDER — SUGAMMADEX SODIUM 500 MG/5ML IV SOLN
INTRAVENOUS | Status: DC | PRN
Start: 2015-08-16 — End: 2015-08-16
  Administered 2015-08-16: 208.6 mg via INTRAVENOUS

## 2015-08-16 MED ORDER — LIDOCAINE 2% (20 MG/ML) 5 ML SYRINGE
INTRAMUSCULAR | Status: DC | PRN
  Administered 2015-08-16: 80 mg via INTRAVENOUS

## 2015-08-16 MED ORDER — PROPOFOL 10 MG/ML IV BOLUS
INTRAVENOUS | Status: DC | PRN
  Administered 2015-08-16: 140 mg via INTRAVENOUS

## 2015-08-16 MED ORDER — FERROUS SULFATE 325 (65 FE) MG PO TABS
325.0000 mg | ORAL_TABLET | Freq: Every day | ORAL | Status: DC
  Administered 2015-08-17: 325 mg via ORAL
  Filled 2015-08-16: qty 1

## 2015-08-16 MED ORDER — LIDOCAINE 2% (20 MG/ML) 5 ML SYRINGE
INTRAMUSCULAR | Status: AC
  Filled 2015-08-16: qty 5

## 2015-08-16 MED ORDER — ARTIFICIAL TEARS OP OINT
TOPICAL_OINTMENT | OPHTHALMIC | Status: AC
  Filled 2015-08-16: qty 3.5

## 2015-08-16 MED ORDER — THROMBIN 5000 UNITS EX SOLR
CUTANEOUS | Status: DC | PRN
  Administered 2015-08-16 (×2): 5000 [IU] via TOPICAL

## 2015-08-16 MED ORDER — KETOROLAC TROMETHAMINE 30 MG/ML IJ SOLN: 30.0000 mg | Freq: Four times a day (QID) | INTRAMUSCULAR | Status: DC

## 2015-08-16 MED ORDER — ROCURONIUM BROMIDE 100 MG/10ML IV SOLN
INTRAVENOUS | Status: DC | PRN
  Administered 2015-08-16: 30 mg via INTRAVENOUS

## 2015-08-16 MED ORDER — MIDAZOLAM HCL 2 MG/2ML IJ SOLN: 1.0000 mg | Freq: Once | INTRAMUSCULAR | Status: DC

## 2015-08-16 MED ORDER — FENTANYL CITRATE (PF) 100 MCG/2ML IJ SOLN
INTRAMUSCULAR | Status: AC
  Administered 2015-08-16: 50 ug via INTRAVENOUS
  Filled 2015-08-16: qty 2

## 2015-08-16 MED ORDER — FENTANYL CITRATE (PF) 100 MCG/2ML IJ SOLN
INTRAMUSCULAR | Status: DC | PRN
  Administered 2015-08-16: 150 ug via INTRAVENOUS
  Administered 2015-08-16 (×3): 50 ug via INTRAVENOUS

## 2015-08-16 MED ORDER — 0.9 % SODIUM CHLORIDE (POUR BTL) OPTIME
TOPICAL | Status: DC | PRN
  Administered 2015-08-16: 1000 mL

## 2015-08-16 MED ORDER — LACTATED RINGERS IV SOLN
INTRAVENOUS | Status: DC | PRN
  Administered 2015-08-16 (×2): via INTRAVENOUS

## 2015-08-16 MED ORDER — HYDROMORPHONE HCL 1 MG/ML IJ SOLN
0.2500 mg | INTRAMUSCULAR | Status: DC | PRN
  Administered 2015-08-16 (×3): 0.5 mg via INTRAVENOUS

## 2015-08-16 MED ORDER — BUPRENORPHINE HCL 2 MG SL SUBL
2.0000 mg | SUBLINGUAL_TABLET | Freq: Every day | SUBLINGUAL | Status: DC
Start: 2015-08-17 — End: 2015-08-17
  Administered 2015-08-17: 2 mg via SUBLINGUAL
  Filled 2015-08-16: qty 1

## 2015-08-16 MED ORDER — SODIUM CHLORIDE 0.9% FLUSH: 3.0000 mL | INTRAVENOUS | Status: DC | PRN

## 2015-08-16 MED ORDER — SUGAMMADEX SODIUM 500 MG/5ML IV SOLN
INTRAVENOUS | Status: AC
  Filled 2015-08-16: qty 5

## 2015-08-16 MED ORDER — PHENYLEPHRINE 40 MCG/ML (10ML) SYRINGE FOR IV PUSH (FOR BLOOD PRESSURE SUPPORT)
PREFILLED_SYRINGE | INTRAVENOUS | Status: AC
  Filled 2015-08-16: qty 10

## 2015-08-16 MED ORDER — HYDROMORPHONE HCL 1 MG/ML IJ SOLN
INTRAMUSCULAR | Status: AC
  Filled 2015-08-16: qty 1

## 2015-08-16 MED ORDER — AMITRIPTYLINE HCL 25 MG PO TABS
150.0000 mg | ORAL_TABLET | Freq: Every day | ORAL | Status: DC
  Administered 2015-08-16: 150 mg via ORAL
  Filled 2015-08-16: qty 6

## 2015-08-16 MED ORDER — GABAPENTIN 300 MG PO CAPS
300.0000 mg | ORAL_CAPSULE | Freq: Three times a day (TID) | ORAL | Status: DC
  Administered 2015-08-16 – 2015-08-17 (×3): 300 mg via ORAL
  Filled 2015-08-16 (×3): qty 1

## 2015-08-16 MED ORDER — KETOROLAC TROMETHAMINE 30 MG/ML IJ SOLN
30.0000 mg | Freq: Four times a day (QID) | INTRAMUSCULAR | Status: DC
  Administered 2015-08-16 – 2015-08-17 (×4): 30 mg via INTRAVENOUS
  Filled 2015-08-16 (×4): qty 1

## 2015-08-16 MED ORDER — LISINOPRIL-HYDROCHLOROTHIAZIDE 20-12.5 MG PO TABS: 1.0000 | ORAL_TABLET | Freq: Every day | ORAL | Status: DC

## 2015-08-16 MED ORDER — LIDOCAINE HCL (CARDIAC) 20 MG/ML IV SOLN
INTRAVENOUS | Status: DC | PRN
  Administered 2015-08-16: 50 mg via INTRAVENOUS

## 2015-08-16 MED ORDER — PROMETHAZINE HCL 25 MG/ML IJ SOLN: 6.2500 mg | INTRAMUSCULAR | Status: DC | PRN

## 2015-08-16 MED ORDER — ONDANSETRON HCL 4 MG/2ML IJ SOLN: 4.0000 mg | INTRAMUSCULAR | Status: DC | PRN

## 2015-08-16 MED ORDER — BUPIVACAINE HCL (PF) 0.5 % IJ SOLN
INTRAMUSCULAR | Status: DC | PRN
  Administered 2015-08-16: 10 mL

## 2015-08-16 MED ORDER — LACTATED RINGERS IV SOLN
INTRAVENOUS | Status: DC | PRN
  Administered 2015-08-16 (×2): via INTRAVENOUS

## 2015-08-16 MED ORDER — LIDOCAINE-EPINEPHRINE 1 %-1:100000 IJ SOLN
INTRAMUSCULAR | Status: DC | PRN
  Administered 2015-08-16: 10 mL

## 2015-08-16 MED ORDER — ROCURONIUM BROMIDE 100 MG/10ML IV SOLN
INTRAVENOUS | Status: DC | PRN
  Administered 2015-08-16: 10 mg via INTRAVENOUS
  Administered 2015-08-16: 40 mg via INTRAVENOUS

## 2015-08-16 MED ORDER — EPHEDRINE SULFATE-NACL 50-0.9 MG/10ML-% IV SOSY
PREFILLED_SYRINGE | INTRAVENOUS | Status: DC | PRN
  Administered 2015-08-16: 5 mg via INTRAVENOUS

## 2015-08-16 MED ORDER — EPHEDRINE 5 MG/ML INJ
INTRAVENOUS | Status: AC
  Filled 2015-08-16: qty 10

## 2015-08-16 MED ORDER — MEPERIDINE HCL 25 MG/ML IJ SOLN: 6.2500 mg | INTRAMUSCULAR | Status: DC | PRN

## 2015-08-16 SURGICAL SUPPLY — 59 items
ADH SKN CLS APL DERMABOND .7 (GAUZE/BANDAGES/DRESSINGS) ×1
APL SKNCLS STERI-STRIP NONHPOA (GAUZE/BANDAGES/DRESSINGS)
BAG DECANTER FOR FLEXI CONT (MISCELLANEOUS) IMPLANT
BENZOIN TINCTURE PRP APPL 2/3 (GAUZE/BANDAGES/DRESSINGS) IMPLANT
BLADE CLIPPER SURG (BLADE) IMPLANT
CANISTER SUCT 3000ML PPV (MISCELLANEOUS) ×3 IMPLANT
CLOSURE WOUND 1/2 X4 (GAUZE/BANDAGES/DRESSINGS)
DECANTER SPIKE VIAL GLASS SM (MISCELLANEOUS) IMPLANT
DERMABOND ADVANCED (GAUZE/BANDAGES/DRESSINGS) ×2
DERMABOND ADVANCED .7 DNX12 (GAUZE/BANDAGES/DRESSINGS) IMPLANT
DRAPE LAPAROTOMY 100X72X124 (DRAPES) ×3 IMPLANT
DRAPE POUCH INSTRU U-SHP 10X18 (DRAPES) ×3 IMPLANT
DRAPE SURG 17X23 STRL (DRAPES) ×3 IMPLANT
DURAPREP 26ML APPLICATOR (WOUND CARE) ×3 IMPLANT
ELECT REM PT RETURN 9FT ADLT (ELECTROSURGICAL) ×3
ELECTRODE REM PT RTRN 9FT ADLT (ELECTROSURGICAL) ×1 IMPLANT
GAUZE SPONGE 4X4 12PLY STRL (GAUZE/BANDAGES/DRESSINGS) IMPLANT
GAUZE SPONGE 4X4 16PLY XRAY LF (GAUZE/BANDAGES/DRESSINGS) IMPLANT
GLOVE BIO SURGEON STRL SZ 6.5 (GLOVE) ×2 IMPLANT
GLOVE BIO SURGEON STRL SZ7 (GLOVE) IMPLANT
GLOVE BIO SURGEON STRL SZ7.5 (GLOVE) IMPLANT
GLOVE BIO SURGEON STRL SZ8 (GLOVE) IMPLANT
GLOVE BIO SURGEON STRL SZ8.5 (GLOVE) IMPLANT
GLOVE BIO SURGEONS STRL SZ 6.5 (GLOVE) ×2
GLOVE ECLIPSE 6.5 STRL STRAW (GLOVE) ×5 IMPLANT
GLOVE ECLIPSE 7.0 STRL STRAW (GLOVE) IMPLANT
GLOVE ECLIPSE 7.5 STRL STRAW (GLOVE) IMPLANT
GLOVE ECLIPSE 8.0 STRL XLNG CF (GLOVE) IMPLANT
GLOVE ECLIPSE 8.5 STRL (GLOVE) IMPLANT
GLOVE INDICATOR 6.5 STRL GRN (GLOVE) ×4 IMPLANT
GLOVE INDICATOR 7.5 STRL GRN (GLOVE) IMPLANT
GLOVE INDICATOR 8.0 STRL GRN (GLOVE) IMPLANT
GLOVE INDICATOR 8.5 STRL (GLOVE) IMPLANT
GLOVE OPTIFIT SS 8.0 STRL (GLOVE) IMPLANT
GLOVE SURG SS PI 6.5 STRL IVOR (GLOVE) IMPLANT
GOWN STRL REUS W/ TWL LRG LVL3 (GOWN DISPOSABLE) ×2 IMPLANT
GOWN STRL REUS W/ TWL XL LVL3 (GOWN DISPOSABLE) IMPLANT
GOWN STRL REUS W/TWL 2XL LVL3 (GOWN DISPOSABLE) IMPLANT
GOWN STRL REUS W/TWL LRG LVL3 (GOWN DISPOSABLE) ×6
GOWN STRL REUS W/TWL XL LVL3 (GOWN DISPOSABLE) ×3
KIT BASIN OR (CUSTOM PROCEDURE TRAY) ×3 IMPLANT
KIT ROOM TURNOVER OR (KITS) ×3 IMPLANT
NEEDLE HYPO 22GX1.5 SAFETY (NEEDLE) ×2 IMPLANT
NS IRRIG 1000ML POUR BTL (IV SOLUTION) ×3 IMPLANT
PACK LAMINECTOMY NEURO (CUSTOM PROCEDURE TRAY) ×3 IMPLANT
PAD ARMBOARD 7.5X6 YLW CONV (MISCELLANEOUS) ×9 IMPLANT
SPONGE LAP 4X18 X RAY DECT (DISPOSABLE) IMPLANT
SPONGE SURGIFOAM ABS GEL SZ50 (HEMOSTASIS) ×3 IMPLANT
STRIP CLOSURE SKIN 1/2X4 (GAUZE/BANDAGES/DRESSINGS) IMPLANT
SUT VIC AB 0 CT1 18XCR BRD8 (SUTURE) ×1 IMPLANT
SUT VIC AB 0 CT1 8-18 (SUTURE) ×3
SUT VIC AB 2-0 CT1 18 (SUTURE) ×3 IMPLANT
SUT VIC AB 3-0 SH 8-18 (SUTURE) ×3 IMPLANT
SWAB COLLECTION DEVICE MRSA (MISCELLANEOUS) IMPLANT
SYR CONTROL 10ML LL (SYRINGE) ×2 IMPLANT
TOWEL OR 17X24 6PK STRL BLUE (TOWEL DISPOSABLE) ×3 IMPLANT
TOWEL OR 17X26 10 PK STRL BLUE (TOWEL DISPOSABLE) ×3 IMPLANT
TUBE ANAEROBIC SPECIMEN COL (MISCELLANEOUS) IMPLANT
WATER STERILE IRR 1000ML POUR (IV SOLUTION) ×3 IMPLANT

## 2015-08-16 SURGICAL SUPPLY — 56 items
BLADE CLIPPER SURG (BLADE) IMPLANT
BUR MATCHSTICK NEURO 3.0 LAGG (BURR) ×3 IMPLANT
BUR ROUND FLUTED 5 RND (BURR) ×1 IMPLANT
BUR ROUND FLUTED 5MM RND (BURR) ×1
DECANTER SPIKE VIAL GLASS SM (MISCELLANEOUS) IMPLANT
DRAPE C-ARM 42X72 X-RAY (DRAPES) ×3 IMPLANT
DRAPE C-ARMOR (DRAPES) ×3 IMPLANT
DRAPE CAMERA VIDEO/LASER (DRAPES) ×1 IMPLANT
DRAPE ORTHO SPLIT 77X108 STRL (DRAPES) ×6
DRAPE POUCH INSTRU U-SHP 10X18 (DRAPES) ×3 IMPLANT
DRAPE SURG ORHT 6 SPLT 77X108 (DRAPES) ×2 IMPLANT
DURAPREP 26ML APPLICATOR (WOUND CARE) ×3 IMPLANT
ELEVATER PASSER (SPINAL CORD STIMULATOR) ×3
GLOVE BIO SURGEON STRL SZ 6.5 (GLOVE) ×2 IMPLANT
GLOVE BIO SURGEONS STRL SZ 6.5 (GLOVE) ×2
GLOVE ECLIPSE 6.5 STRL STRAW (GLOVE) ×3 IMPLANT
GLOVE ECLIPSE 7.0 STRL STRAW (GLOVE) IMPLANT
GLOVE ECLIPSE 7.5 STRL STRAW (GLOVE) IMPLANT
GLOVE ECLIPSE 8.0 STRL XLNG CF (GLOVE) IMPLANT
GLOVE ECLIPSE 8.5 STRL (GLOVE) IMPLANT
GLOVE INDICATOR 7.0 STRL GRN (GLOVE) ×4 IMPLANT
GLOVE INDICATOR 7.5 STRL GRN (GLOVE) ×2 IMPLANT
GOWN STRL REUS W/ TWL LRG LVL3 (GOWN DISPOSABLE) ×2 IMPLANT
GOWN STRL REUS W/ TWL XL LVL3 (GOWN DISPOSABLE) ×1 IMPLANT
GOWN STRL REUS W/TWL 2XL LVL3 (GOWN DISPOSABLE) IMPLANT
GOWN STRL REUS W/TWL LRG LVL3 (GOWN DISPOSABLE) ×6
GOWN STRL REUS W/TWL XL LVL3 (GOWN DISPOSABLE) ×3
IPG PRECISION SPECTRA (Stimulator) ×2 IMPLANT
KIT BASIN OR (CUSTOM PROCEDURE TRAY) ×3 IMPLANT
KIT CHARGING (KITS) ×2
KIT CHARGING PRECISION NEURO (KITS) IMPLANT
KIT PAT PROGRAM FREELINK (KITS) IMPLANT
KIT ROOM TURNOVER OR (KITS) ×3 IMPLANT
LEAD PADDLE COVEREDGE 70CM (Lead) ×2 IMPLANT
LIQUID BAND (GAUZE/BANDAGES/DRESSINGS) ×3 IMPLANT
NDL HYPO 25X1 1.5 SAFETY (NEEDLE) ×1 IMPLANT
NDL SPNL 22GX3.5 QUINCKE BK (NEEDLE) IMPLANT
NEEDLE HYPO 25X1 1.5 SAFETY (NEEDLE) ×3 IMPLANT
NEEDLE SPNL 22GX3.5 QUINCKE BK (NEEDLE) IMPLANT
NS IRRIG 1000ML POUR BTL (IV SOLUTION) ×3 IMPLANT
PACK LAMINECTOMY NEURO (CUSTOM PROCEDURE TRAY) ×3 IMPLANT
PAD ARMBOARD 7.5X6 YLW CONV (MISCELLANEOUS) ×9 IMPLANT
PASSER ELEVATOR (SPINAL CORD STIMULATOR) ×1 IMPLANT
REMOTE CONTROL KIT (KITS) ×3
SPONGE SURGIFOAM ABS GEL SZ50 (HEMOSTASIS) ×3 IMPLANT
SUT SILK 2 0 TIES 10X30 (SUTURE) IMPLANT
SUT VIC AB 0 CT1 18XCR BRD8 (SUTURE) ×1 IMPLANT
SUT VIC AB 0 CT1 8-18 (SUTURE) ×3
SUT VIC AB 2-0 CT1 18 (SUTURE) ×3 IMPLANT
SUT VIC AB 3-0 SH 8-18 (SUTURE) ×3 IMPLANT
SYR BULB 3OZ (MISCELLANEOUS) ×1 IMPLANT
SYR CONTROL 10ML LL (SYRINGE) ×1 IMPLANT
TOOL LONG TUNNEL (SPINAL CORD STIMULATOR) ×3 IMPLANT
TOWEL OR 17X24 6PK STRL BLUE (TOWEL DISPOSABLE) ×3 IMPLANT
TOWEL OR 17X26 10 PK STRL BLUE (TOWEL DISPOSABLE) ×3 IMPLANT
WATER STERILE IRR 1000ML POUR (IV SOLUTION) ×3 IMPLANT

## 2015-08-16 NOTE — Progress Notes (Signed)
Patient prepped and returned to OR on stretcher.

## 2015-08-16 NOTE — H&P (Signed)
BP (!) 119/92   Pulse 80   Temp 97.9 F (36.6 C) (Oral)   Resp 20   Ht 5\' 1"  (1.549 m)   Wt 104.3 kg (230 lb)   LMP 08/07/2015   SpO2 100%   BMI 43.46 kg/m  Ms. [name] is a 40 year old woman, soon to turn 29, who presents today for evaluation of pain that she has in the back and right lower extremity. She has had this pain now for a number of years. She, in the past, fell down three times in succession and she thinks she had surgery at some point in 2009-2010. She says that the surgery did not improve her situation. The surgery was done at [facility]. I did see that Dr. Mauri Pole had ordered films, but I have no idea if he is the one that actually did surgery. She said that she was told she had degenerative disk disease and osteoarthritis. She cannot stand long. Both knees hurt and feel as if they would give out. She says in her mind it feels like she has a nerve that is hitting on something and that causes her to have severe pain. She has had no urinary incontinence, but states it does take her much longer to urinate now than it did in the past. She has normal bowel function. If she sits on something very hard it will make her back hurt much more. She did have physical therapy after surgery, again, unfortunately without relief. She has had pain management but now is a Suboxone clinic. She does not take any opiates or street drugs. There was mention during one of the notes to the Emergency Department that she wanted Dilaudid but she eventually relented, was not given any by the ED and has complied with all of her tests. The caseworker who was with her today stated she has never had a positive urine test and they are tested two to three times a week. She does have a history of bipolar disorder and is treated for that. She also has a history of hypertension, gastroesophageal reflux, cholesterol, iron deficiency also. 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 Hardin Negus and Dr. Mauri Pole at [facility]. She is disabled, right-handed. She has been using a walker the last month and this is a rolling walker secondary to her fear of falling and the pain. She says the pain has gotten worse recently but she does not have an idea why. Once the pain is present, nothing really makes it better. REVIEW OF SYSTEMS: Review of systems is positive for hearing loss, balance problems, chest pain, hypertension, hypercholesterolemia, swelling in feet, leg pain with walking, bronchitis, indigestion, change in her bowel habits, difficulty starting urinary stream, leg weakness. On the pain chart, she lists pain in the back and entire right lower extremity and in the left knee.  PAST MEDICAL HISTORY:   Prior Operations: Had lower back surgery, she says in 2009 by Dr. Hardin Negus, Dr. Mauri Pole, surgery on her broken left ankle at [facility] in 2000, possibly an umbilical hernia because she describes that something happened where they had to cut from inside my belly hole, and a cholecystectomy.  Medications and Allergies: MORPHINE MAKES HER BURN AND ITCH ALL OVER, LIKE IT IS A FIRE.  She takes Dexilant, pravastatin, alprazolam, Chantix, Elavil, lisinopril, ferrous sulfate, Suboxone.  FAMILY HISTORY: Mother is in very poor health. Father is 28, he is in okay health. Hypertension, diabetes, arthritis, cancer, and gastroesophageal reflux present in the family history. SOCIAL HISTORY: She does smoke. She does drink alcohol, wine. She does have a history of substance abuse five years ago. [name] returned today after undergoing screening by Dr. Brien Few for a spinal cord stimulator. He believes that she will be a good candidate. She has used the device constantly, got 80% reduction in her back and leg.  PHYSICAL EXAMINATION: Normal exam otherwise. She  has an antalgic gait, moves slowly, has normal muscle tone, bulk, and coordination. She is obese.  IMPRESSION/PLAN: Thus, I will go ahead and plan to put this in. We will do this probably 07/26. She understands what the procedure is; that I will do an open procedure, place the device proximally around T7 which is where they usually ask me to place it. I also informed her that I want her to wear the most comfortable pair of pants so that I can put the generator in the correct place.

## 2015-08-16 NOTE — Progress Notes (Signed)
  When trying to get pt. Ready for surgery, she is drowsy and having to wake her up multiple times to answer questions. She states she didn't sleep much last night and took a xanax this am. Notified Anes, Dr, Jillyn Hidden to come assess pt.  Pt. States she will ride [employer] transportation home today and her son will be at home.

## 2015-08-16 NOTE — Op Note (Signed)
08/16/2015  3:23 PM  PATIENT:  [name]  40 y.o. female  PRE-OPERATIVE DIAGNOSIS:  Postlaminectomy syndrome of lumbar region  POST-OPERATIVE DIAGNOSIS:  Postlaminectomy syndrome of lumbar region  PROCEDURE:  Procedure(s): LUMBAR SPINAL CORD STIMULATOR INSERTION T9/10 laminectomy SURGEON: Surgeon(s): Ashok Pall, MD  ASSISTANTS:none  ANESTHESIA:   general  EBL:  Total I/O In: 1300 [I.V.:1300] Out: 100 [Blood:100]  BLOOD ADMINISTERED:none  CELL SAVER GIVEN:none  COUNT:per nursing  DRAINS: none   SPECIMEN:  No Specimen  DICTATION: [name] was taken to the operating room, intubated, and placed under a general anesthetic without difficulty.She was positioned prone on the [place] table with all pressure points properly padded.  female Her back was prepped and draped in a sterile manner. I opened the thoracic region to expose the lamina of T9 and T 10. I opened the skin with a 10 blade, then dissected sharply to expose the lamina of T9 and 10. I used monopolar cautery in the dissection. Once the level was verified, I used the drill, Leksell rongeur, and Kerrison punches to perform hemilaminectomies of T9, and 10. I removed the ligamentum flavum and exposed the thecal sac in the laminectomy site. I then positioned the spinal cord stimulator lead so that it spanned the T8 vertebral body with some coverage of T7, and some at T9.  I then made a small incision at her waistline to create a subcutaneous pocket for the stimulator. I tunneled the leads from the thoracic incision to the pocket incision. I connected the leads(4) to the stimulator and the system was checked. The system showed good contact and a functioning stimulator. I then started to close and we took another film to confirm the lead position. It had pulled slightly back so I repositioned it, took another film and the lead was in proper position. I then closed both wound in layers with vicryl sutures. I used  dermabond for a sterile dressing. [name] was rolled back onto the OR stretcher, and extubated.   PLAN OF CARE: Admit for overnight observation  PATIENT DISPOSITION:  PACU - hemodynamically stable.   Delay start of Pharmacological VTE agent (>24hrs) due to surgical blood loss or risk of bleeding:  yes

## 2015-08-16 NOTE — Anesthesia Procedure Notes (Signed)
Procedure Name: Intubation Date/Time: 08/16/2015 12:07 PM Performed by: Neldon Newport Pre-anesthesia Checklist: Timeout performed, Patient being monitored, Suction available, Emergency Drugs available and Patient identified Patient Re-evaluated:Patient Re-evaluated prior to inductionOxygen Delivery Method: Circle system utilized Preoxygenation: Pre-oxygenation with 100% oxygen Intubation Type: IV induction Ventilation: Mask ventilation without difficulty Laryngoscope Size: Mac and 3 Grade View: Grade I Tube type: Oral Tube size: 7.0 mm Number of attempts: 1 Placement Confirmation: breath sounds checked- equal and bilateral,  positive ETCO2 and ETT inserted through vocal cords under direct vision Secured at: 21 cm Tube secured with: Tape Dental Injury: Teeth and Oropharynx as per pre-operative assessment

## 2015-08-16 NOTE — Transfer of Care (Signed)
Immediate Anesthesia Transfer of Care Note  Patient: [name]  Procedure(s) Performed: Procedure(s): Spinal Cord Stimulator Revision (N/A)  Patient Location: PACU  Anesthesia Type:General  Level of Consciousness: awake, alert , oriented and patient cooperative  Airway & Oxygen Therapy: Patient Spontanous Breathing and Patient connected to nasal cannula oxygen  Post-op Assessment: Report given to RN and Post -op Vital signs reviewed and stable  Post vital signs: Reviewed and stable  Last Vitals:  Vitals:   08/16/15 2000 08/16/15 2150  BP: (!) 129/97   Pulse: 80 94  Resp: 17 16  Temp:  36.1 C    Last Pain:  Vitals:   08/16/15 2150  TempSrc:   PainSc: Asleep      Patients Stated Pain Goal: 4 ([id number])  Complications: No apparent anesthesia complications

## 2015-08-16 NOTE — Progress Notes (Signed)
Awaiting Dr Christella Noa to come bedside in PACU to discuss plan for patient at this time.

## 2015-08-16 NOTE — Anesthesia Preprocedure Evaluation (Signed)
Anesthesia Evaluation  Patient identified by MRN, date of birth, ID band Patient awake    Reviewed: Allergy & Precautions, H&P , NPO status , Patient's Chart, lab work & pertinent test results  Airway Mallampati: I  TM Distance: >3 FB Neck ROM: full    Dental  (+) Teeth Intact, Dental Advidsory Given, Poor Dentition, Edentulous Lower   Pulmonary Current Smoker,    Pulmonary exam normal        Cardiovascular hypertension, On Medications Normal cardiovascular exam Rhythm:Regular Rate:Normal     Neuro/Psych  Neuromuscular disease    GI/Hepatic GERD  Medicated and Controlled,  Endo/Other  Morbid obesity  Renal/GU      Musculoskeletal  (+) Arthritis , Osteoarthritis,  Fibromyalgia -  Abdominal (+) + obese,   Peds  Hematology   Anesthesia Other Findings Moderately sedated.  Reproductive/Obstetrics                             Anesthesia Physical Anesthesia Plan  ASA: III and emergent  Anesthesia Plan: General   Post-op Pain Management:    Induction: Intravenous  Airway Management Planned: Oral ETT  Additional Equipment:   Intra-op Plan:   Post-operative Plan: Extubation in OR  Informed Consent: I have reviewed the patients History and Physical, chart, labs and discussed the procedure including the risks, benefits and alternatives for the proposed anesthesia with the patient or authorized representative who has indicated his/her understanding and acceptance.     Plan Discussed with: CRNA  Anesthesia Plan Comments:         Anesthesia Quick Evaluation

## 2015-08-16 NOTE — Op Note (Signed)
08/16/2015  9:56 PM  PATIENT:  [name]  40 y.o. female whom immediately post op could not feel stimulation from the spinal cord stimulator which had just been inserted. A chest xray showed the lead in the thoracic spine had been pulled back out of the epidural space. I recommended and she agreed to have the lead repositioned in the correct position.   PRE-OPERATIVE DIAGNOSIS:  misplaced stimulator lead  POST-OPERATIVE DIAGNOSIS:  misplaced stimulator lead  PROCEDURE:  Procedure(s): Spinal Cord Stimulator Revision  SURGEON: Surgeon(s): Ashok Pall, MD  ASSISTANTS:none  ANESTHESIA:   general  EBL:  Total I/O In: 1300 [I.V.:1300] Out: 10 [Blood:10]  BLOOD ADMINISTERED:none  CELL SAVER GIVEN:none  COUNT:per nursing  DRAINS: none   SPECIMEN:  No Specimen  DICTATION: SHARLETTA BOLAN was taken to the operating room, intubated, and placed under a general anesthetic without difficulty. female She was positioned prone on the wilson frame with all pressure points properly padded. Her thoracic incision was prepped and draped in a sterile manner. I opened the incision and used the scalpel and scissors to divide the sutures. I placed a self retaining retractor and exposed the laminectomy and the lead which was in the soft tissue. I then with fluoroscopic confirmation placed the lead in the epidural space spanning T8. Fluoroscopy confirmed the location. I then irrigated and closed the wound in layers with vicryl sutures. After closing I had another film taken and the lead remained in the correct position. I used dermabond for a sterile dressing. She was rolled supine onto the stretcher and extubated.   PLAN OF CARE: Admit for overnight observation  PATIENT DISPOSITION:  PACU - hemodynamically stable.   Delay start of Pharmacological VTE agent (>24hrs) due to surgical blood loss or risk of bleeding:  yes

## 2015-08-16 NOTE — Anesthesia Preprocedure Evaluation (Signed)
Anesthesia Evaluation  Patient identified by MRN, date of birth, ID band Patient awake    Reviewed: Allergy & Precautions, H&P , NPO status , Patient's Chart, lab work & pertinent test results  Airway Mallampati: II  TM Distance: >3 FB Neck ROM: full    Dental no notable dental hx. (+) Teeth Intact   Pulmonary neg pulmonary ROS, Current Smoker,    Pulmonary exam normal        Cardiovascular hypertension, On Medications Normal cardiovascular exam     Neuro/Psych PSYCHIATRIC DISORDERS Bipolar Disorder    GI/Hepatic Neg liver ROS, GERD  ,  Endo/Other  Morbid obesity  Renal/GU negative Renal ROS  negative genitourinary   Musculoskeletal   Abdominal (+) + obese,   Peds  Hematology negative hematology ROS (+)   Anesthesia Other Findings   Reproductive/Obstetrics negative OB ROS                             Anesthesia Physical Anesthesia Plan  ASA: II  Anesthesia Plan: General ETT   Post-op Pain Management:    Induction: Intravenous  Airway Management Planned: Oral ETT  Additional Equipment:   Intra-op Plan:   Post-operative Plan: Extubation in OR  Informed Consent: I have reviewed the patients History and Physical, chart, labs and discussed the procedure including the risks, benefits and alternatives for the proposed anesthesia with the patient or authorized representative who has indicated his/her understanding and acceptance.   Dental Advisory Given  Plan Discussed with: CRNA and Surgeon  Anesthesia Plan Comments:         Anesthesia Quick Evaluation

## 2015-08-16 NOTE — Transfer of Care (Signed)
Immediate Anesthesia Transfer of Care Note  Patient: [name]  Procedure(s) Performed: Procedure(s) with comments: LUMBAR SPINAL CORD STIMULATOR INSERTION (N/A) - [place]  Patient Location: PACU  Anesthesia Type:General  Level of Consciousness: awake, alert  and oriented  Airway & Oxygen Therapy: Patient Spontanous Breathing and Patient connected to nasal cannula oxygen  Post-op Assessment: Report given to RN and Post -op Vital signs reviewed and stable  Post vital signs: Reviewed and stable  Last Vitals:  Vitals:   08/16/15 0822 08/16/15 1421  BP: (!) 119/92   Pulse: 80 97  Resp: 20 15  Temp: 36.6 C     Last Pain:  Vitals:   08/16/15 0937  TempSrc:   PainSc: 8          Complications: No apparent anesthesia complications

## 2015-08-16 NOTE — Anesthesia Preprocedure Evaluation (Addendum)
Anesthesia Evaluation  Patient identified by MRN, date of birth, ID band Patient awake    Reviewed: Allergy & Precautions, H&P , NPO status , Patient's Chart, lab work & pertinent test results  Airway Mallampati: I  TM Distance: >3 FB Neck ROM: full    Dental  (+) Teeth Intact, Dental Advidsory Given, Poor Dentition, Edentulous Lower   Pulmonary Current Smoker,    Pulmonary exam normal        Cardiovascular hypertension, On Medications Normal cardiovascular exam     Neuro/Psych    GI/Hepatic GERD  Medicated and Controlled,  Endo/Other  Morbid obesity  Renal/GU      Musculoskeletal  (+) Arthritis , Osteoarthritis,  Fibromyalgia -  Abdominal (+) + obese,   Peds  Hematology   Anesthesia Other Findings Moderately sedated.  Reproductive/Obstetrics                          Anesthesia Physical Anesthesia Plan  ASA: III  Anesthesia Plan: General   Post-op Pain Management:    Induction: Intravenous  Airway Management Planned: Oral ETT  Additional Equipment:   Intra-op Plan:   Post-operative Plan: Extubation in OR  Informed Consent: I have reviewed the patients History and Physical, chart, labs and discussed the procedure including the risks, benefits and alternatives for the proposed anesthesia with the patient or authorized representative who has indicated his/her understanding and acceptance.   Dental Advisory Given  Plan Discussed with: Anesthesiologist, CRNA and Surgeon  Anesthesia Plan Comments:        Anesthesia Quick Evaluation

## 2015-08-16 NOTE — Anesthesia Postprocedure Evaluation (Signed)
Anesthesia Post Note  Patient: [name]  Procedure(s) Performed: Procedure(s) (LRB): LUMBAR SPINAL CORD STIMULATOR INSERTION (N/A)  Patient location during evaluation: PACU Anesthesia Type: General Level of consciousness: awake Pain management: pain level controlled Vital Signs Assessment: post-procedure vital signs reviewed and stable Respiratory status: spontaneous breathing Cardiovascular status: stable Postop Assessment: no signs of nausea or vomiting Anesthetic complications: no     Last Vitals:  Vitals:   08/16/15 0822 08/16/15 1421  BP: (!) 119/92 (!) 145/92  Pulse: 80 97  Resp: 20 15  Temp: 36.6 C 36.1 C    Last Pain:  Vitals:   08/16/15 1421  TempSrc:   PainSc: Asleep   Pain Goal:                 Robbin Escher JR,JOHN Lanorris Kalisz

## 2015-08-16 NOTE — Anesthesia Procedure Notes (Signed)
Procedure Name: Intubation Date/Time: 08/16/2015 8:31 PM Performed by: Hollie Salk Z Pre-anesthesia Checklist: Patient identified, Emergency Drugs available, Suction available and Patient being monitored Patient Re-evaluated:Patient Re-evaluated prior to inductionOxygen Delivery Method: Circle System Utilized Preoxygenation: Pre-oxygenation with 100% oxygen Intubation Type: IV induction Ventilation: Mask ventilation without difficulty Laryngoscope Size: Mac and 3 Grade View: Grade I Tube type: Oral Number of attempts: 1 Airway Equipment and Method: Stylet and Oral airway Placement Confirmation: ETT inserted through vocal cords under direct vision,  positive ETCO2 and breath sounds checked- equal and bilateral Secured at: 22 cm Tube secured with: Tape Dental Injury: Teeth and Oropharynx as per pre-operative assessment

## 2015-08-16 NOTE — Progress Notes (Signed)
BP (!) 132/97 (BP Location: Left Arm)   Pulse 77   Temp 97 F (36.1 C)   Resp 16   Ht 5\' 1"  (1.549 m)   Wt 104.3 kg (230 lb)   LMP 08/07/2015 Comment: verified BEFORE imaging  SpO2 100%   BMI 43.46 kg/m  [name] was unable to feel any stimulation once she awoke. Chest xray showed the lead was no longer in position, and was different than the last film in the OR. I recommended that we reposition the lead in order to effect pain relief with stimulation. She understands and wishes to proceed, her mother was contacted and gave consent since [name] had received narcotics for post op pain control.

## 2015-08-16 NOTE — Progress Notes (Addendum)
Pt arrived from PACU about 2230 alert and oriented pain is controled at this time, SCD's on regular diet put in and patient ambulated to bathroom upon arrival 2 assist with walker. Will continue to monitor.

## 2015-08-16 NOTE — Progress Notes (Signed)
Patient arrived to PACU with skin tear to abdomen below folds on right side of belly. OR applied bacitracin to wound.

## 2015-08-17 ENCOUNTER — Encounter (HOSPITAL_COMMUNITY): Payer: Self-pay | Admitting: General Practice

## 2015-08-17 DIAGNOSIS — M961 Postlaminectomy syndrome, not elsewhere classified: Secondary | ICD-10-CM | POA: Diagnosis not present

## 2015-08-17 MED ORDER — PANTOPRAZOLE SODIUM 40 MG PO TBEC
40.0000 mg | DELAYED_RELEASE_TABLET | Freq: Two times a day (BID) | ORAL | Status: DC
  Administered 2015-08-17: 40 mg via ORAL

## 2015-08-17 MED ORDER — OXYCODONE-ACETAMINOPHEN 10-325 MG PO TABS: 1.0000 | ORAL_TABLET | Freq: Four times a day (QID) | ORAL | 0 refills | Status: DC | PRN

## 2015-08-17 NOTE — Care Management Note (Signed)
Case Management Note  Patient Details  Name: [name] MRN: [id number] Date of Birth: [birth year]  Subjective/Objective:   Pt underwent:  Spinal Cord Stimulator Revision. She is from home with relatives.                Action/Plan: CM following for discharge needs.  Expected Discharge Date:                  Expected Discharge Plan:  [place]  In-House Referral:     Discharge planning Services     Post Acute Care Choice:    Choice offered to:     DME Arranged:    DME Agency:     HH Arranged:    [place] Agency:     Status of Service:  In process, will continue to follow  If discussed at Long Length of Stay Meetings, dates discussed:    Additional Comments:  Pollie Friar, RN 08/17/2015, 3:22 PM

## 2015-08-17 NOTE — Discharge Summary (Signed)
  Physician Discharge Summary  Patient ID: ADASHIA MACARAEG MRN: [id number] DOB/AGE: [birth year] 41 y.o.  Admit date: 08/16/2015 LUMBAR SPINAL CORD STIMULATOR INSERTION   Discharge date: 08/17/2015  Admission Diagnoses:post laminectomy syndrome  Discharge Diagnoses:  Active Problems:   Post laminectomy syndrome   Discharged Condition: good  Hospital Course: Ms. Luetkemeyer was admitted and taken to the operating room for placement of a spinal cord stimulator via a thoracic laminectomy. Immediately post op she could not feel any stimulation. Therefore an xray was taken showing the thoracic SCS lead had been pulled out of position. I took her back to the operating room to reposition the lead. After that we achieved good stimulation. At discharge her wound is clean, dry, and without signs of infection.   Treatments: surgery: LUMBAR SPINAL CORD STIMULATOR INSERTION T9/10 laminectomy Spinal Cord Stimulator Revision   Discharge Exam: Blood pressure 115/74, pulse 93, temperature 98 F (36.7 C), temperature source Oral, resp. rate 18, height 5\' 1"  (1.549 m), weight 104.3 kg (230 lb), last menstrual period 08/07/2015, SpO2 100 %. General appearance: alert, cooperative, appears stated age, no distress and mildly obese Neurologic: Alert and oriented X 3, normal strength and tone. Normal symmetric reflexes. Normal coordination and gait  Disposition: 01-Home or Self Care misplaced stimulator lead    Medication List    TAKE these medications   ALPRAZolam 1 MG tablet Commonly known as:  XANAX Take 1 mg by mouth 3 (three) times daily as needed for anxiety.   amitriptyline 150 MG tablet Commonly known as:  ELAVIL Take 150 mg by mouth at bedtime.   buprenorphine-naloxone 8-2 MG Subl SL tablet Commonly known as:  SUBOXONE Place 1 tablet under the tongue 3 (three) times daily.   DEXILANT 60 MG capsule Generic drug:  dexlansoprazole Take 60 mg by mouth daily.   ferrous sulfate 325 (65 FE) MG  tablet Take 325 mg by mouth daily with breakfast.   gabapentin 300 MG capsule Commonly known as:  NEURONTIN Take 300 mg by mouth 3 (three) times daily.   lisinopril-hydrochlorothiazide 20-12.5 MG tablet Commonly known as:  PRINZIDE,ZESTORETIC Take 1 tablet by mouth daily.   oxyCODONE-acetaminophen 10-325 MG tablet Commonly known as:  PERCOCET Take 1 tablet by mouth every 6 (six) hours as needed for pain.      Follow-up [place], MD .   Specialty:  Neurosurgery Why:  please call the office to make an appointment for 2 weeks.  Contact information: [address] [place] [address] [phone]           Signed: Winfield Cunas 08/17/2015, 6:07 PM

## 2015-08-17 NOTE — Discharge Instructions (Signed)
Call the office if your wound starts to drain, if you experience a temperature greater than 101.5 If your wound becomes very tender.

## 2015-08-17 NOTE — Progress Notes (Signed)
Pt to be discharge home. All discharge instructions given and understood. RX prescription given to pt

## 2015-08-17 NOTE — Care Management Obs Status (Signed)
[place] NOTIFICATION   Patient Details  Name: [name] MRN: [id number] Date of Birth: 03/26/1975   Medicare Observation Status Notification Given:  Yes    Pollie Friar, RN 08/17/2015, 1:27 PM

## 2015-08-23 NOTE — Anesthesia Postprocedure Evaluation (Signed)
Anesthesia Post Note  Patient: [name]  Procedure(s) Performed: Procedure(s) (LRB): Spinal Cord Stimulator Revision (N/A)  Patient location during evaluation: PACU Anesthesia Type: General Level of consciousness: awake and alert Pain management: pain level controlled Vital Signs Assessment: post-procedure vital signs reviewed and stable Respiratory status: spontaneous breathing, nonlabored ventilation, respiratory function stable and patient connected to nasal cannula oxygen Cardiovascular status: blood pressure returned to baseline and stable Postop Assessment: no signs of nausea or vomiting Anesthetic complications: no    Last Vitals:  Vitals:   08/17/15 0942 08/17/15 1449  BP: 112/68 115/74  Pulse: 82 93  Resp: 17 18  Temp: 36.8 C 36.7 C    Last Pain:  Vitals:   08/17/15 1757  TempSrc:   PainSc: 0-No pain                 Kiyonna Tortorelli,JAMES TERRILL

## 2016-08-26 ENCOUNTER — Encounter: Payer: Self-pay | Admitting: Internal Medicine

## 2016-10-29 ENCOUNTER — Telehealth: Payer: Self-pay | Admitting: Gastroenterology

## 2016-10-29 ENCOUNTER — Ambulatory Visit: Payer: Medicare Other | Admitting: Gastroenterology

## 2016-10-29 ENCOUNTER — Encounter: Payer: Self-pay | Admitting: Gastroenterology

## 2016-10-29 NOTE — Telephone Encounter (Signed)
Patient was a no show and letter sent  °

## 2016-11-07 ENCOUNTER — Ambulatory Visit: Payer: Medicare Other | Admitting: Gastroenterology

## 2016-11-13 ENCOUNTER — Ambulatory Visit: Payer: Medicare Other | Admitting: Gastroenterology

## 2016-11-13 ENCOUNTER — Ambulatory Visit (INDEPENDENT_AMBULATORY_CARE_PROVIDER_SITE_OTHER): Payer: Medicare Other | Admitting: Gastroenterology

## 2016-11-13 ENCOUNTER — Other Ambulatory Visit: Payer: Self-pay

## 2016-11-13 ENCOUNTER — Encounter: Payer: Self-pay | Admitting: Gastroenterology

## 2016-11-13 VITALS — BP 139/87 | HR 95 | Temp 98.9°F | Ht 60.0 in | Wt 220.8 lb

## 2016-11-13 DIAGNOSIS — K625 Hemorrhage of anus and rectum: Secondary | ICD-10-CM

## 2016-11-13 DIAGNOSIS — R151 Fecal smearing: Secondary | ICD-10-CM

## 2016-11-13 DIAGNOSIS — K219 Gastro-esophageal reflux disease without esophagitis: Secondary | ICD-10-CM | POA: Diagnosis not present

## 2016-11-13 DIAGNOSIS — R6881 Early satiety: Secondary | ICD-10-CM | POA: Diagnosis not present

## 2016-11-13 DIAGNOSIS — R112 Nausea with vomiting, unspecified: Secondary | ICD-10-CM

## 2016-11-13 DIAGNOSIS — R159 Full incontinence of feces: Secondary | ICD-10-CM | POA: Insufficient documentation

## 2016-11-13 MED ORDER — NA SULFATE-K SULFATE-MG SULF 17.5-3.13-1.6 GM/177ML PO SOLN: 1.0000 | ORAL | 0 refills | Status: DC

## 2016-11-13 NOTE — Patient Instructions (Signed)
1. Please have your blood work done. 2. Colonoscopy and upper endoscopy as scheduled.  Please see separate instructions.

## 2016-11-13 NOTE — Progress Notes (Signed)
Primary Care Physician:  Remi Haggard, FNP  Primary Gastroenterologist:  Garfield Cornea, MD   Chief Complaint  Patient presents with  . Nausea    x many years  . Gastroesophageal Reflux    unable to eat red meats  . Diarrhea    having accidents in her pants    HPI:  [name] is a 41 y.o. female here at the request of Threasa Alpha, [place] for further evaluation of abdominal pain, nausea, diarrhea.  Patient seen remotely in 2005 for recurrent right upper quadrant pain and vomiting.  ERCP done for dilated biliary tree.  Sphincterotomy performed, sludge and debris removed from the bile duct.  Patient thought she had a colonoscopy at [facility] but I do not see any records of that.  Patient has multiple GI complaints.  States she has had GI symptoms ever since umbilical hernia repair at age 27.  States she presented with purulent drainage from her umbilicus at that time.  Planes of regurgitating/burping up food, vomiting smell of food.  Complains of early satiety and vomiting on a regular basis.  She avoids red meat because it "tears me all to pieces".  Heartburn she believes is well controlled on Dexilant.  She is tried and failed Zantac, Prilosec, Nexium, Protonix in the past.  She uses Excedrin for headache.  Denies other NSAIDs.  Worried about gaining 8 pounds in the past 2 weeks.  States she is not able to eat solid food on a liquids may be a few crackers at a time.  Her biggest concern is change in bowel habits over the past 1 month.  Denies frank diarrhea.  Planes of constant ooze of soft stool.  Takes forever to clean up.  Denies frank incontinence.  Stool smells worse than usual.  No melena.  Bright red blood with wiping.   Current Outpatient Prescriptions  Medication Sig Dispense Refill  . ALPRAZolam (XANAX) 1 MG tablet Take 1 mg by mouth 3 (three) times daily as needed for anxiety.    . Aspirin-Acetaminophen-Caffeine (EXCEDRIN PO) Take by mouth.    . baclofen  (LIORESAL) 10 MG tablet 2 (two) times daily.    Marland Kitchen DEXILANT 60 MG capsule Take 60 mg by mouth daily.    Marland Kitchen gabapentin (NEURONTIN) 300 MG capsule Take 300 mg by mouth 2 (two) times daily.     Marland Kitchen lisinopril-hydrochlorothiazide (PRINZIDE,ZESTORETIC) 20-12.5 MG per tablet Take 1 tablet by mouth daily.    . SUBOXONE 8-2 MG FILM 3 (three) times daily.    Marland Kitchen umeclidinium-vilanterol (ANORO ELLIPTA) 62.5-25 MCG/INH AEPB Inhale 1 puff into the lungs daily.    .       No current facility-administered medications for this visit.     Allergies as of 11/13/2016 - Review Complete 11/13/2016  Allergen Reaction Noted  . Vicodin [hydrocodone-acetaminophen] Nausea And Vomiting 08/16/2015  . Acetaminophen Nausea Only 08/08/2015  . Hydrocil [psyllium] Nausea Only 08/08/2015  . Percocet [oxycodone-acetaminophen] Nausea Only 08/08/2015    Past Medical History:  Diagnosis Date  . Bipolar 1 disorder ([place])   . Fibromyalgia   . GERD (gastroesophageal reflux disease)   . History of degenerative disc disease   . Hypertension   . Manic depression ([place])   . osteoarthritis    osteoarthritis  . Sciatica     Past Surgical History:  Procedure Laterality Date  . ANKLE SURGERY Left   . BACK SURGERY    . CHOLECYSTECTOMY  1998   Dr. Anthony Sar  .  ERCP  2005   Dr. Laural Golden: s/p sphincerotomy with removal of sludge and debris  . LUMBAR WOUND DEBRIDEMENT N/A 08/16/2015   Procedure: Spinal Cord Stimulator Revision;  Surgeon: Ashok Pall, MD;  Location: [place] [facility];  Service: Neurosurgery;  Laterality: N/A;  . SPINAL CORD STIMULATOR INSERTION N/A 08/16/2015   Procedure: LUMBAR SPINAL CORD STIMULATOR INSERTION;  Surgeon: Ashok Pall, MD;  Location: [place] [facility];  Service: Neurosurgery;  Laterality: N/A;  LUMBAR SPINAL CORD STIMULATOR INSERTION  . TUBAL LIGATION    . UMBILICAL HERNIA REPAIR  1995   [facility]    Family History  Problem Relation Age of Onset  . Crohn's disease Maternal Aunt   . Rheum arthritis  Mother   . Diabetes Mother   . Colon cancer Neg Hx     Social History   Social History  . Marital status: Married    Spouse name: N/A  . Number of children: 2  . Years of education: N/A   Occupational History  . disabled    Social History Main Topics  . Smoking status: Current Every Day Smoker    Packs/day: 0.50    Types: Cigarettes  . Smokeless tobacco: Never Used  . Alcohol use No  . Drug use: Yes    Types: Marijuana     Comment: none since 07/2016  . Sexual activity: Yes    Birth control/ protection: Surgical   Other Topics Concern  . Not on file   Social History Narrative  . No narrative on file      ROS:  General: Negative for anorexia, weight loss, fever, chills, fatigue, weakness. Eyes: Negative for vision changes.  ENT: Negative for hoarseness, difficulty swallowing , nasal congestion. CV: Negative for chest pain, angina, palpitations, dyspnea on exertion, peripheral edema.  Respiratory: Negative for dyspnea at rest, dyspnea on exertion, cough, sputum, wheezing.  GI: See history of present illness. GU:  Negative for dysuria, hematuria, urinary incontinence, urinary frequency, nocturnal urination.  MS: Negative for joint pain, chronic back pain, muscle pain  Derm: Negative for rash or itching.  Neuro: Negative for weakness, abnormal sensation, seizure, frequent headaches, memory loss, confusion.  Psych: Positive for anxiety/depression.  Negative for suicidal ideation, hallucinations.  Endo: Negative for unusual weight change.  Heme: Negative for bruising or bleeding. Allergy: Negative for rash or hives.    Physical Examination:  BP 139/87   Pulse 95   Temp 98.9 F (37.2 C) (Oral)   Ht 5' (1.524 m)   Wt 220 lb 12.8 oz (100.2 kg)   BMI 43.12 kg/m    General: Well-nourished, well-developed in no acute distress.  Head: Normocephalic, atraumatic.   Eyes: Conjunctiva pink, no icterus. Mouth: Oropharyngeal mucosa moist and pink , no lesions erythema  or exudate. Neck: Supple without thyromegaly, masses, or lymphadenopathy.  Lungs: Clear to auscultation bilaterally.  Heart: Regular rate and rhythm, no murmurs rubs or gallops.  Abdomen: Bowel sounds are normal, nontender, nondistended, no hepatosplenomegaly or masses, no abdominal bruits or    hernia , no rebound or guarding.   Rectal: not performed Extremities: No lower extremity edema. No clubbing or deformities.  Neuro: Alert and oriented x 4 , grossly normal neurologically.  Skin: Warm and dry, no rash or jaundice.   Psych: Alert and cooperative, normal mood and affect.  Labs: Labs dated July 01, 2016 Sodium 139, potassium 3.9, glucose 104, BUN 12, creatinine 0.71, total bilirubin 0.3, alkaline phosphatase 79, AST 14, ALT 10, albumin 3.9, white blood  cell count 6, hemoglobin 11.5, hematocrit 38, MCV 73 low, platelets 402,000  Imaging Studies: No results found.

## 2016-11-14 ENCOUNTER — Telehealth: Payer: Self-pay

## 2016-11-14 NOTE — Telephone Encounter (Signed)
Called and informed pt of pre-op appt 12/17/16 at 9:00am. Letter mailed.

## 2016-11-18 ENCOUNTER — Encounter: Payer: Self-pay | Admitting: Gastroenterology

## 2016-11-18 NOTE — Assessment & Plan Note (Signed)
Change in bowel habits over the past 1 month.  Complains of frequent fecal soiling, seepage of stool with urinating but no frank incontinence.  Stool soft.    Some bright red blood on the toilet tissue.  Plan for colonoscopy in the near future.  Deep sedation in the OR given polypharmacy.  Plan for labs  I have discussed the risks, alternatives, benefits with regards to but not limited to the risk of reaction to medication, bleeding, infection, perforation and the patient is agreeable to proceed. Written consent to be obtained.

## 2016-11-18 NOTE — Progress Notes (Signed)
CC'ED TO PCP 

## 2016-11-18 NOTE — Assessment & Plan Note (Signed)
41 year old female with chronic GI complaints.  Complains of early satiety with frequent vomiting.  She has chronic GERD typical heartburn well controlled.  Denies dysphagia.  Takes Excedrin for headache.  Differential diagnosis includes gastroparesis, gastritis, peptic ulcer disease, uncontrolled GERD.  Sedation in the OR given polypharmacy.  I have discussed the risks, alternatives, benefits with regards to but not limited to the risk of reaction to medication, bleeding, infection, perforation and the patient is agreeable to proceed. Written consent to be obtained.  Continue Dexilant daily.  Obtain labs.

## 2016-12-17 ENCOUNTER — Other Ambulatory Visit (HOSPITAL_COMMUNITY): Payer: Medicare Other

## 2016-12-18 NOTE — Patient Instructions (Signed)
[name]  12/18/2016     @PREFPERIOPPHARMACY @   Your procedure is scheduled on 12/24/2016.  Report to [facility] at 8:15 A.M.  Call this number if you have problems the morning of surgery:  [phone]   Remember:  Do not eat food or drink liquids after midnight.  Take these medicines the morning of surgery with A SIP OF WATER Xanax, Baclofen, Dexilant, Gabapentin   Do not wear jewelry, make-up or nail polish.  Do not wear lotions, powders, or perfumes, or deoderant.  Do not shave 48 hours prior to surgery.  Men may shave face and neck.  Do not bring valuables to the hospital.  [facility] is not responsible for any belongings or valuables.  Contacts, dentures or bridgework may not be worn into surgery.  Leave your suitcase in the car.  After surgery it may be brought to your room.  For patients admitted to the hospital, discharge time will be determined by your treatment team.  Patients discharged the day of surgery will not be allowed to drive home.    Please read over the following fact sheets that you were given. Anesthesia Post-op Instructions     PATIENT INSTRUCTIONS POST-ANESTHESIA  IMMEDIATELY FOLLOWING SURGERY:  Do not drive or operate machinery for the first twenty four hours after surgery.  Do not make any important decisions for twenty four hours after surgery or while taking narcotic pain medications or sedatives.  If you develop intractable nausea and vomiting or a severe headache please notify your doctor immediately.  FOLLOW-UP:  Please make an appointment with your surgeon as instructed. You do not need to follow up with anesthesia unless specifically instructed to do so.  WOUND CARE INSTRUCTIONS (if applicable):  Keep a dry clean dressing on the anesthesia/puncture wound site if there is drainage.  Once the wound has quit draining you may leave it open to air.  Generally you should leave the bandage intact for twenty four hours unless there is  drainage.  If the epidural site drains for more than 36-48 hours please call the anesthesia department.  QUESTIONS?:  Please feel free to call your physician or the hospital operator if you have any questions, and they will be happy to assist you.      Colonoscopy, Adult A colonoscopy is an exam to look at the entire large intestine. During the exam, a lubricated, bendable tube is inserted into the anus and then passed into the rectum, colon, and other parts of the large intestine. A colonoscopy is often done as a part of normal colorectal screening or in response to certain symptoms, such as anemia, persistent diarrhea, abdominal pain, and blood in the stool. The exam can help screen for and diagnose medical problems, including:  Tumors.  Polyps.  Inflammation.  Areas of bleeding.  Tell a health care provider about:  Any allergies you have.  All medicines you are taking, including vitamins, herbs, eye drops, creams, and over-the-counter medicines.  Any problems you or family members have had with anesthetic medicines.  Any blood disorders you have.  Any surgeries you have had.  Any medical conditions you have.  Any problems you have had passing stool. What are the risks? Generally, this is a safe procedure. However, problems may occur, including:  Bleeding.  A tear in the intestine.  A reaction to medicines given during the exam.  Infection (rare).  What happens before the procedure? Eating and drinking restrictions Follow instructions from your health care  provider about eating and drinking, which may include:  A few days before the procedure - follow a low-fiber diet. Avoid nuts, seeds, dried fruit, raw fruits, and vegetables.  1-3 days before the procedure - follow a clear liquid diet. Drink only clear liquids, such as clear broth or bouillon, black coffee or tea, clear juice, clear soft drinks or sports drinks, gelatin dessert, and popsicles. Avoid any liquids  that contain red or purple dye.  On the day of the procedure - do not eat or drink anything during the 2 hours before the procedure, or within the time period that your health care provider recommends.  Bowel prep If you were prescribed an oral bowel prep to clean out your colon:  Take it as told by your health care provider. Starting the day before your procedure, you will need to drink a large amount of medicated liquid. The liquid will cause you to have multiple loose stools until your stool is almost clear or light green.  If your skin or anus gets irritated from diarrhea, you may use these to relieve the irritation: ? Medicated wipes, such as adult wet wipes with aloe and vitamin E. ? A skin soothing-product like petroleum jelly.  If you vomit while drinking the bowel prep, take a break for up to 60 minutes and then begin the bowel prep again. If vomiting continues and you cannot take the bowel prep without vomiting, call your health care provider.  General instructions  Ask your health care provider about changing or stopping your regular medicines. This is especially important if you are taking diabetes medicines or blood thinners.  Plan to have someone take you home from the hospital or clinic. What happens during the procedure?  An IV tube may be inserted into one of your veins.  You will be given medicine to help you relax (sedative).  To reduce your risk of infection: ? Your health care team will wash or sanitize their hands. ? Your anal area will be washed with soap.  You will be asked to lie on your side with your knees bent.  Your health care provider will lubricate a long, thin, flexible tube. The tube will have a camera and a light on the end.  The tube will be inserted into your anus.  The tube will be gently eased through your rectum and colon.  Air will be delivered into your colon to keep it open. You may feel some pressure or cramping.  The camera will be  used to take images during the procedure.  A small tissue sample may be removed from your body to be examined under a microscope (biopsy). If any potential problems are found, the tissue will be sent to a lab for testing.  If small polyps are found, your health care provider may remove them and have them checked for cancer cells.  The tube that was inserted into your anus will be slowly removed. The procedure may vary among health care providers and hospitals. What happens after the procedure?  Your blood pressure, heart rate, breathing rate, and blood oxygen level will be monitored until the medicines you were given have worn off.  Do not drive for 24 hours after the exam.  You may have a small amount of blood in your stool.  You may pass gas and have mild abdominal cramping or bloating due to the air that was used to inflate your colon during the exam.  It is up to you to get  the results of your procedure. Ask your health care provider, or the department performing the procedure, when your results will be ready. This information is not intended to replace advice given to you by your health care provider. Make sure you discuss any questions you have with your health care provider. Document Released: 01/05/2000 Document Revised: 11/08/2015 Document Reviewed: 03/21/2015 Elsevier Interactive Patient Education  2018 [employer]. Esophagogastroduodenoscopy Esophagogastroduodenoscopy (EGD) is a procedure to examine the lining of the esophagus, stomach, and first part of the small intestine (duodenum). This procedure is done to check for problems such as inflammation, bleeding, ulcers, or growths. During this procedure, a long, flexible, lighted tube with a camera attached (endoscope) is inserted down the throat. Tell a health care provider about:  Any allergies you have.  All medicines you are taking, including vitamins, herbs, eye drops, creams, and over-the-counter medicines.  Any  problems you or family members have had with anesthetic medicines.  Any blood disorders you have.  Any surgeries you have had.  Any medical conditions you have.  Whether you are pregnant or may be pregnant. What are the risks? Generally, this is a safe procedure. However, problems may occur, including:  Infection.  Bleeding.  A tear (perforation) in the esophagus, stomach, or duodenum.  Trouble breathing.  Excessive sweating.  Spasms of the larynx.  A slowed heartbeat.  Low blood pressure.  What happens before the procedure?  Follow instructions from your health care provider about eating or drinking restrictions.  Ask your health care provider about: ? Changing or stopping your regular medicines. This is especially important if you are taking diabetes medicines or blood thinners. ? Taking medicines such as aspirin and ibuprofen. These medicines can thin your blood. Do not take these medicines before your procedure if your health care provider instructs you not to.  Plan to have someone take you home after the procedure.  If you wear dentures, be ready to remove them before the procedure. What happens during the procedure?  To reduce your risk of infection, your health care team will wash or sanitize their hands.  An IV tube will be put in a vein in your hand or arm. You will get medicines and fluids through this tube.  You will be given one or more of the following: ? A medicine to help you relax (sedative). ? A medicine to numb the area (local anesthetic). This medicine may be sprayed into your throat. It will make you feel more comfortable and keep you from gagging or coughing during the procedure. ? A medicine for pain.  A mouth guard may be placed in your mouth to protect your teeth and to keep you from biting on the endoscope.  You will be asked to lie on your left side.  The endoscope will be lowered down your throat into your esophagus, stomach, and  duodenum.  Air will be put into the endoscope. This will help your health care provider see better.  The lining of your esophagus, stomach, and duodenum will be examined.  Your health care provider may: ? Take a tissue sample so it can be looked at in a lab (biopsy). ? Remove growths. ? Remove objects (foreign bodies) that are stuck. ? Treat any bleeding with medicines or other devices that stop tissue from bleeding. ? Widen (dilate) or stretch narrowed areas of your esophagus and stomach.  The endoscope will be taken out. The procedure may vary among health care providers and hospitals. What happens after the  procedure?  Your blood pressure, heart rate, breathing rate, and blood oxygen level will be monitored often until the medicines you were given have worn off.  Do not eat or drink anything until the numbing medicine has worn off and your gag reflex has returned. This information is not intended to replace advice given to you by your health care provider. Make sure you discuss any questions you have with your health care provider. Document Released: 05/10/2004 Document Revised: 06/15/2015 Document Reviewed: 12/01/2014 Elsevier Interactive Patient Education  [employer].

## 2016-12-19 ENCOUNTER — Encounter (HOSPITAL_COMMUNITY): Payer: Self-pay

## 2016-12-19 ENCOUNTER — Other Ambulatory Visit: Payer: Self-pay

## 2016-12-19 ENCOUNTER — Encounter (HOSPITAL_COMMUNITY)
Admission: RE | Admit: 2016-12-19 | Discharge: 2016-12-19 | Disposition: A | Discharge status: Home / Self Care | Payer: Medicare Other | Source: Ambulatory Visit | Attending: Internal Medicine | Admitting: Internal Medicine

## 2016-12-19 DIAGNOSIS — Z01812 Encounter for preprocedural laboratory examination: Secondary | ICD-10-CM | POA: Diagnosis not present

## 2016-12-19 LAB — BASIC METABOLIC PANEL
Anion gap: 8 (ref 5–15)
BUN: 17 mg/dL (ref 6–20)
CHLORIDE: 101 mmol/L (ref 101–111)
CO2: 27 mmol/L (ref 22–32)
CREATININE: 0.9 mg/dL (ref 0.44–1.00)
Calcium: 9.2 mg/dL (ref 8.9–10.3)
GFR calc non Af Amer: >60 mL/min (ref >60)
Glucose, Bld: 72 mg/dL (ref 65–99)
Potassium: 3.4 mmol/L — ABNORMAL LOW (ref 3.5–5.1)
SODIUM: 136 mmol/L (ref 135–145)

## 2016-12-19 LAB — CBC
HCT: 36.4 % (ref 36.0–46.0)
Hemoglobin: 10.9 g/dL — ABNORMAL LOW (ref 12.0–15.0)
MCH: 21.8 pg — ABNORMAL LOW (ref 26.0–34.0)
MCHC: 29.9 g/dL — AB (ref 30.0–36.0)
MCV: 72.7 fL — ABNORMAL LOW (ref 78.0–100.0)
PLATELETS: 398 10*3/uL (ref 150–400)
RBC: 5.01 MIL/uL (ref 3.87–5.11)
RDW: 17.8 % — AB (ref 11.5–15.5)
WBC: 6.1 10*3/uL (ref 4.0–10.5)

## 2016-12-19 LAB — HCG, QUANTITATIVE, PREGNANCY

## 2016-12-23 ENCOUNTER — Telehealth: Payer: Self-pay

## 2016-12-23 ENCOUNTER — Ambulatory Visit (HOSPITAL_COMMUNITY): Admission: RE | Admit: 2016-12-23 | Payer: Medicare Other | Source: Ambulatory Visit | Admitting: Internal Medicine

## 2016-12-23 ENCOUNTER — Encounter (HOSPITAL_COMMUNITY): Admission: RE | Payer: Self-pay | Source: Ambulatory Visit

## 2016-12-23 SURGERY — COLONOSCOPY WITH PROPOFOL
Anesthesia: Monitor Anesthesia Care

## 2016-12-23 NOTE — Telephone Encounter (Signed)
Noted  

## 2016-12-23 NOTE — Telephone Encounter (Signed)
Pt called office to cancel TCS/EGD w/Propofol that was scheduled for today with [facility]. Stated she wasn't feeling well. Offered to reschedule procedure in January, but she said she would call back when she was ready to reschedule.   Routing to LSL as FYI.

## 2017-01-02 ENCOUNTER — Ambulatory Visit: Payer: Medicare Other | Admitting: Gastroenterology

## 2017-03-10 ENCOUNTER — Other Ambulatory Visit: Payer: Self-pay

## 2017-03-10 ENCOUNTER — Emergency Department (HOSPITAL_COMMUNITY)
Admission: EM | Admit: 2017-03-10 | Discharge: 2017-03-10 | Disposition: A | Discharge status: Home / Self Care | Payer: Medicare HMO | Attending: Emergency Medicine | Admitting: Emergency Medicine

## 2017-03-10 ENCOUNTER — Encounter (HOSPITAL_COMMUNITY): Payer: Self-pay | Admitting: Emergency Medicine

## 2017-03-10 DIAGNOSIS — M5431 Sciatica, right side: Secondary | ICD-10-CM | POA: Insufficient documentation

## 2017-03-10 DIAGNOSIS — Z79899 Other long term (current) drug therapy: Secondary | ICD-10-CM | POA: Insufficient documentation

## 2017-03-10 DIAGNOSIS — I10 Essential (primary) hypertension: Secondary | ICD-10-CM | POA: Insufficient documentation

## 2017-03-10 DIAGNOSIS — M545 Low back pain: Secondary | ICD-10-CM | POA: Diagnosis present

## 2017-03-10 DIAGNOSIS — F17210 Nicotine dependence, cigarettes, uncomplicated: Secondary | ICD-10-CM | POA: Diagnosis not present

## 2017-03-10 LAB — URINALYSIS, ROUTINE W REFLEX MICROSCOPIC
BILIRUBIN URINE: NEGATIVE
GLUCOSE, UA: NEGATIVE mg/dL
Hgb urine dipstick: NEGATIVE
KETONES UR: NEGATIVE mg/dL
LEUKOCYTES UA: NEGATIVE
NITRITE: NEGATIVE
PH: 6 (ref 5.0–8.0)
Protein, ur: 100 mg/dL — AB
Specific Gravity, Urine: 1.029 (ref 1.005–1.030)

## 2017-03-10 LAB — PREGNANCY, URINE: Preg Test, Ur: NEGATIVE

## 2017-03-10 MED ORDER — HYDROMORPHONE HCL 1 MG/ML IJ SOLN
1.0000 mg | Freq: Once | INTRAMUSCULAR | Status: AC
  Administered 2017-03-10: 1 mg via INTRAMUSCULAR
  Filled 2017-03-10: qty 1

## 2017-03-10 MED ORDER — PREDNISONE 10 MG PO TABS: ORAL_TABLET | ORAL | 0 refills | Status: DC

## 2017-03-10 MED ORDER — DEXAMETHASONE SODIUM PHOSPHATE 10 MG/ML IJ SOLN
10.0000 mg | Freq: Once | INTRAMUSCULAR | Status: AC
  Administered 2017-03-10: 10 mg via INTRAMUSCULAR
  Filled 2017-03-10: qty 1

## 2017-03-10 NOTE — ED Notes (Signed)
Pt up to bathroom via wheelchair.  Pt had difficult time getting to wheelchair and to toilet.  Pt had difficult time urinating.  Pt was on the toilet for approximately 15 minutes trying to void.  Urine was dark and cloudy.  Pt states she has not been eating and drinking due to the pain.

## 2017-03-10 NOTE — ED Provider Notes (Signed)
[facility] EMERGENCY DEPARTMENT Provider Note   CSN: [id number] Arrival date & time: 03/10/17  1001     History   Chief Complaint Chief Complaint  Patient presents with  . Back Pain    HPI [name] is a 42 y.o. female with a history of chronic low back pain with intermittent episodes of sciatica, status post multiple lumbar surgeries for degenerative disc disease presenting with worse pain of her right lower back radiating into her right posterior mid thigh.  She denies any new injury, but probably overexerted yesterday with walking around the mall which she suspects is a source of today's pain as she is typically fairly sedentary.  She is been unable to walk without assistance this morning secondary to pain but denies weakness or numbness in her extremities.  She has had no urinary or fecal incontinence or retention and denies saddle anesthesia.  She is followed still by [place] [facility] and is scheduled for steroid injections next month, also followed by chronic pain management in [facility]  She is contacted her provider who recommended an interim ED visit for a steroid injection and pain control.  She currently is treated with gabapentin, baclofen and Suboxone by her chronic pain specialist, however has not had her Suboxone in 2 weeks.  The history is provided by the patient.    Past Medical History:  Diagnosis Date  . Bipolar 1 disorder ([place])   . Fibromyalgia   . GERD (gastroesophageal reflux disease)   . History of degenerative disc disease   . Hypertension   . Manic depression ([place])   . osteoarthritis    osteoarthritis  . Sciatica     Patient Active Problem List   Diagnosis Date Noted  . GERD (gastroesophageal reflux disease) 11/13/2016  . Nausea with vomiting 11/13/2016  . Rectal bleeding 11/13/2016  . Fecal incontinence 11/13/2016  . Early satiety 11/13/2016  . Post laminectomy syndrome 08/16/2015    Past Surgical History:  Procedure  Laterality Date  . ANKLE SURGERY Left    ORIF  . BACK SURGERY     x3  . CHOLECYSTECTOMY  1998   Dr. Anthony Sar  . ERCP  2005   Dr. Laural Golden: s/p sphincerotomy with removal of sludge and debris  . LUMBAR WOUND DEBRIDEMENT N/A 08/16/2015   Procedure: Spinal Cord Stimulator Revision;  Surgeon: Ashok Pall, MD;  Location: [place] [facility];  Service: Neurosurgery;  Laterality: N/A;  . SPINAL CORD STIMULATOR INSERTION N/A 08/16/2015   Procedure: LUMBAR SPINAL CORD STIMULATOR INSERTION;  Surgeon: Ashok Pall, MD;  Location: [place] [facility];  Service: Neurosurgery;  Laterality: N/A;  LUMBAR SPINAL CORD STIMULATOR INSERTION  . TUBAL LIGATION    . [place]    OB History    No data available       Home Medications    Prior to Admission medications   Medication Sig Start Date End Date Taking? Authorizing Provider  ALPRAZolam Duanne Moron) 1 MG tablet Take 1 mg by mouth 3 (three) times daily as needed for anxiety.    [provider]  amitriptyline (ELAVIL) 150 MG tablet Take 1 tablet by mouth at bedtime. 10/21/16   [provider]  Aspirin-Acetaminophen-Caffeine (EXCEDRIN PO) Take 1-2 tablets by mouth 2 (two) times daily as needed (headaches).     [provider]  baclofen (LIORESAL) 10 MG tablet Take 10 mg by mouth 2 (two) times daily.  10/21/16   [provider]  DEXILANT 60 MG capsule Take 60 mg by mouth daily. 07/04/15   [provider]  gabapentin (NEURONTIN) 100 MG capsule Take 1 capsule by mouth 2 (two) times daily. 10/21/16   [provider]  lisinopril-hydrochlorothiazide (PRINZIDE,ZESTORETIC) 20-25 MG tablet Take 1 tablet by mouth daily. 09/24/16   [provider]  predniSONE (DELTASONE) 10 MG tablet Take 6 tablets day one, 5 tablets day two, 4 tablets day three, 3 tablets day four, 2 tablets day five, then 1 tablet day six 03/10/17   Raylie Maddison, Almyra Free, PA-C  SUBOXONE 8-2 MG FILM Place 1 Film under the tongue 3  (three) times daily.  10/23/16   [provider]  umeclidinium-vilanterol (ANORO ELLIPTA) 62.5-25 MCG/INH AEPB Inhale 1 puff into the lungs daily.    [provider]    Family History Family History  Problem Relation Age of Onset  . Crohn's disease Maternal Aunt   . Rheum arthritis Mother   . Diabetes Mother   . Colon cancer Neg Hx     Social History Social History   Tobacco Use  . Smoking status: Current Every Day Smoker    Packs/day: 0.50    Years: 20.00    Pack years: 10.00    Types: Cigarettes  . Smokeless tobacco: Never Used  Substance Use Topics  . Alcohol use: No  . Drug use: Yes    Types: Marijuana    Comment: none since 07/2016     Allergies   Vicodin [hydrocodone-acetaminophen]; Acetaminophen; Hydrocil [psyllium]; and Percocet [oxycodone-acetaminophen]   Review of Systems Review of Systems  Constitutional: Negative for fever.  Respiratory: Negative for shortness of breath.   Cardiovascular: Negative for chest pain and leg swelling.  Gastrointestinal: Negative for abdominal distention, abdominal pain and constipation.  Genitourinary: Negative for difficulty urinating, dysuria, flank pain, frequency and urgency.  Musculoskeletal: Positive for back pain. Negative for gait problem and joint swelling.  Skin: Negative for rash.  Neurological: Negative for weakness and numbness.     Physical Exam Updated Vital Signs BP (!) 150/108   Pulse 97   Temp 98.7 F (37.1 C) (Oral)   Resp 18   Ht 5' (1.524 m)   Wt 99.8 kg (220 lb)   LMP 02/07/2017   SpO2 100%   BMI 42.97 kg/m   Physical Exam  Constitutional: She appears well-developed and well-nourished.  HENT:  Head: Normocephalic.  Eyes: Conjunctivae are normal.  Neck: Normal range of motion. Neck supple.  Cardiovascular: Normal rate and intact distal pulses.  Pedal pulses normal.  Pulmonary/Chest: Effort normal.  Abdominal: Soft. Bowel sounds are normal. She exhibits no distension and  no mass.  Musculoskeletal: Normal range of motion. She exhibits tenderness. She exhibits no edema.       Lumbar back: She exhibits tenderness. She exhibits no bony tenderness, no swelling, no edema and no spasm.       Back:  Neurological: She is alert. She has normal strength. She displays no atrophy and no tremor. No sensory deficit. Gait normal.  Reflex Scores:      Patellar reflexes are 2+ on the right side and 2+ on the left side. No strength deficit noted in hip and knee flexor and extensor muscle groups.  Ankle flexion and extension intact. Pt ambulated in dept after her pain improved without deficit.  Skin: Skin is warm and dry.  Psychiatric: She has a normal mood and affect.  Nursing note and vitals reviewed.    ED Treatments / Results  Labs (all labs  ordered are listed, but only abnormal results are displayed) Labs Reviewed  URINALYSIS, ROUTINE W REFLEX MICROSCOPIC - Abnormal; Notable for the following components:      Result Value   Color, Urine AMBER (*)    APPearance CLOUDY (*)    Protein, ur 100 (*)    Bacteria, UA FEW (*)    Squamous Epithelial / LPF 0-5 (*)    All other components within normal limits  PREGNANCY, URINE    EKG  EKG Interpretation None       Radiology No results found.  Procedures Procedures (including critical care time)  Medications Ordered in ED Medications  HYDROmorphone (DILAUDID) injection 1 mg (1 mg Intramuscular Given 03/10/17 1244)  dexamethasone (DECADRON) injection 10 mg (10 mg Intramuscular Given 03/10/17 1302)     Initial Impression / Assessment and Plan / ED Course  I have reviewed the triage vital signs and the nursing notes.  Pertinent labs & imaging results that were available during my care of the patient were reviewed by me and considered in my medical decision making (see chart for details).     Patient was given an IM injection of Dilaudid and Decadron with significant improvement in pain symptoms.  She was able  to ambulate without deficit.  She was placed on a prednisone taper, advised to continue her other home medications.  She will need to contact her pain specialist for further narcotic pain medications.  Discussed with patient who understands and agrees with plan.  No neuro deficit on exam or by history to suggest emergent or surgical presentation.  Discussed worsened sx that should prompt immediate re-evaluation including distal weakness, bowel/bladder retention/incontinence.        Final Clinical Impressions(s) / ED Diagnoses   Final diagnoses:  Sciatica of right side    ED Discharge Orders        Ordered    predniSONE (DELTASONE) 10 MG tablet     03/10/17 1321       Evalee Jefferson, PA-C 03/10/17 1723    Mesner, Corene Cornea, MD 03/11/17 [phone]

## 2017-03-10 NOTE — Discharge Instructions (Signed)
Take your next dose of prednisone tomorrow morning.  Avoid lifting,  Bending,  Twisting or any other activity that worsens your pain over the next week.  Apply an  icepack  to your lower back for 10-15 minutes every 2 hours for the next 2 days.  You should get rechecked if your symptoms are not better over the next 5 days,  Or you develop increased pain,  Weakness in your leg(s) or loss of bladder or bowel function - these are symptoms of a worsening condition.

## 2017-03-10 NOTE — ED Triage Notes (Signed)
History of chronic back and sciatica. Pt states back started hurting yesterday.

## 2017-05-10 ENCOUNTER — Emergency Department (HOSPITAL_COMMUNITY)
Admission: EM | Admit: 2017-05-10 | Discharge: 2017-05-10 | Disposition: A | Discharge status: Home / Self Care | Payer: Medicare HMO | Attending: Emergency Medicine | Admitting: Emergency Medicine

## 2017-05-10 ENCOUNTER — Encounter (HOSPITAL_COMMUNITY): Payer: Self-pay | Admitting: Emergency Medicine

## 2017-05-10 ENCOUNTER — Other Ambulatory Visit: Payer: Self-pay

## 2017-05-10 ENCOUNTER — Emergency Department (HOSPITAL_COMMUNITY): Payer: Medicare HMO

## 2017-05-10 DIAGNOSIS — J40 Bronchitis, not specified as acute or chronic: Secondary | ICD-10-CM | POA: Diagnosis not present

## 2017-05-10 DIAGNOSIS — I10 Essential (primary) hypertension: Secondary | ICD-10-CM | POA: Diagnosis not present

## 2017-05-10 DIAGNOSIS — R0602 Shortness of breath: Secondary | ICD-10-CM | POA: Diagnosis present

## 2017-05-10 DIAGNOSIS — Z79899 Other long term (current) drug therapy: Secondary | ICD-10-CM | POA: Diagnosis not present

## 2017-05-10 DIAGNOSIS — F17210 Nicotine dependence, cigarettes, uncomplicated: Secondary | ICD-10-CM | POA: Diagnosis not present

## 2017-05-10 LAB — COMPREHENSIVE METABOLIC PANEL
ALT: 14 U/L (ref 14–54)
ANION GAP: 11 (ref 5–15)
AST: 18 U/L (ref 15–41)
Albumin: 3.4 g/dL — ABNORMAL LOW (ref 3.5–5.0)
Alkaline Phosphatase: 72 U/L (ref 38–126)
BUN: 8 mg/dL (ref 6–20)
CO2: 22 mmol/L (ref 22–32)
CREATININE: 0.64 mg/dL (ref 0.44–1.00)
Calcium: 8.9 mg/dL (ref 8.9–10.3)
Chloride: 104 mmol/L (ref 101–111)
Glucose, Bld: 123 mg/dL — ABNORMAL HIGH (ref 65–99)
POTASSIUM: 3.4 mmol/L — AB (ref 3.5–5.1)
Sodium: 137 mmol/L (ref 135–145)
Total Bilirubin: 0.4 mg/dL (ref 0.3–1.2)
Total Protein: 7.1 g/dL (ref 6.5–8.1)

## 2017-05-10 LAB — CBC WITH DIFFERENTIAL/PLATELET
Basophils Absolute: 0 10*3/uL (ref 0.0–0.1)
Basophils Relative: 0 %
EOS PCT: 0 %
Eosinophils Absolute: 0 10*3/uL (ref 0.0–0.7)
HCT: 33.8 % — ABNORMAL LOW (ref 36.0–46.0)
Hemoglobin: 10 g/dL — ABNORMAL LOW (ref 12.0–15.0)
LYMPHS PCT: 16 %
Lymphs Abs: 1.7 10*3/uL (ref 0.7–4.0)
MCH: 19.6 pg — AB (ref 26.0–34.0)
MCHC: 29.6 g/dL — AB (ref 30.0–36.0)
MCV: 66.4 fL — AB (ref 78.0–100.0)
MONO ABS: 0.4 10*3/uL (ref 0.1–1.0)
MONOS PCT: 4 %
Neutro Abs: 8 10*3/uL — ABNORMAL HIGH (ref 1.7–7.7)
Neutrophils Relative %: 80 %
PLATELETS: 447 10*3/uL — AB (ref 150–400)
RBC: 5.09 MIL/uL (ref 3.87–5.11)
RDW: 18.8 % — AB (ref 11.5–15.5)
WBC: 10.1 10*3/uL (ref 4.0–10.5)

## 2017-05-10 LAB — TROPONIN I

## 2017-05-10 MED ORDER — GUAIFENESIN ER 600 MG PO TB12
ORAL_TABLET | ORAL | Status: AC
  Filled 2017-05-10: qty 1

## 2017-05-10 MED ORDER — PREDNISONE 50 MG PO TABS
60.0000 mg | ORAL_TABLET | Freq: Once | ORAL | Status: AC
  Administered 2017-05-10: 60 mg via ORAL
  Filled 2017-05-10: qty 1

## 2017-05-10 MED ORDER — IPRATROPIUM-ALBUTEROL 0.5-2.5 (3) MG/3ML IN SOLN: 3.0000 mL | Freq: Once | RESPIRATORY_TRACT | Status: DC

## 2017-05-10 MED ORDER — VARENICLINE TARTRATE 0.5 MG PO TABS: 0.5000 mg | ORAL_TABLET | Freq: Two times a day (BID) | ORAL | 0 refills | Status: DC

## 2017-05-10 MED ORDER — ASPIRIN-ACETAMINOPHEN-CAFFEINE 250-250-65 MG PO TABS
1.0000 | ORAL_TABLET | Freq: Once | ORAL | Status: DC
  Filled 2017-05-10: qty 1

## 2017-05-10 MED ORDER — MAGNESIUM SULFATE 2 GM/50ML IV SOLN
2.0000 g | Freq: Once | INTRAVENOUS | Status: AC
  Administered 2017-05-10: 2 g via INTRAVENOUS
  Filled 2017-05-10: qty 50

## 2017-05-10 MED ORDER — LEVOFLOXACIN 750 MG PO TABS
750.0000 mg | ORAL_TABLET | Freq: Once | ORAL | Status: AC
  Administered 2017-05-10: 750 mg via ORAL
  Filled 2017-05-10: qty 1

## 2017-05-10 MED ORDER — IPRATROPIUM-ALBUTEROL 0.5-2.5 (3) MG/3ML IN SOLN
3.0000 mL | RESPIRATORY_TRACT | Status: AC
  Administered 2017-05-10 (×3): 3 mL via RESPIRATORY_TRACT
  Filled 2017-05-10: qty 9

## 2017-05-10 MED ORDER — GUAIFENESIN ER 600 MG PO TB12
600.0000 mg | ORAL_TABLET | Freq: Two times a day (BID) | ORAL | Status: DC
  Administered 2017-05-10: 600 mg via ORAL
  Filled 2017-05-10 (×4): qty 1

## 2017-05-10 MED ORDER — ALBUTEROL SULFATE (2.5 MG/3ML) 0.083% IN NEBU: 5.0000 mg | INHALATION_SOLUTION | Freq: Once | RESPIRATORY_TRACT | Status: DC

## 2017-05-10 MED ORDER — ALBUTEROL SULFATE HFA 108 (90 BASE) MCG/ACT IN AERS
2.0000 | INHALATION_SPRAY | Freq: Once | RESPIRATORY_TRACT | Status: AC
  Administered 2017-05-10: 2 via RESPIRATORY_TRACT
  Filled 2017-05-10: qty 6.7

## 2017-05-10 MED ORDER — LEVOFLOXACIN 750 MG PO TABS: 750.0000 mg | ORAL_TABLET | Freq: Every day | ORAL | 0 refills | Status: DC

## 2017-05-10 MED ORDER — PREDNISONE 20 MG PO TABS: ORAL_TABLET | ORAL | 0 refills | Status: DC

## 2017-05-10 NOTE — ED Provider Notes (Signed)
Emergency Department Provider Note   I have reviewed the triage vital signs and the nursing notes.   HISTORY  Chief Complaint Shortness of Breath   HPI [name] is a 42 y.o. female history of chronic bronchitis and multiple other medical problems as documented below the presents to the emergency department today with shortness of breath.  Patient states she is a 3 or 4 days of progressively worsening dyspnea not helped with her inhaler at home.  She states that she has had something like this before but not quite this bad.  States that she will have significant wheezing and difficulty getting a breath in which caused her to feel like she is suffocating.  She does have any chest pain with the but does have some congestion in her chest she feels.  No fevers no productive cough.  No other recent illnesses. No other associated or modifying symptoms.    Past Medical History:  Diagnosis Date  . Bipolar 1 disorder ([place])   . Fibromyalgia   . GERD (gastroesophageal reflux disease)   . History of degenerative disc disease   . Hypertension   . Manic depression ([place])   . osteoarthritis    osteoarthritis  . Sciatica     Patient Active Problem List   Diagnosis Date Noted  . GERD (gastroesophageal reflux disease) 11/13/2016  . Nausea with vomiting 11/13/2016  . Rectal bleeding 11/13/2016  . Fecal incontinence 11/13/2016  . Early satiety 11/13/2016  . Post laminectomy syndrome 08/16/2015    Past Surgical History:  Procedure Laterality Date  . ANKLE SURGERY Left    ORIF  . BACK SURGERY     x3  . CHOLECYSTECTOMY  1998   Dr. Anthony Sar  . ERCP  2005   Dr. Laural Golden: s/p sphincerotomy with removal of sludge and debris  . LUMBAR WOUND DEBRIDEMENT N/A 08/16/2015   Procedure: Spinal Cord Stimulator Revision;  Surgeon: Ashok Pall, MD;  Location: [facility];  Service: Neurosurgery;  Laterality: N/A;  . SPINAL CORD STIMULATOR INSERTION N/A 08/16/2015   Procedure: LUMBAR SPINAL CORD  STIMULATOR INSERTION;  Surgeon: Ashok Pall, MD;  Location: [facility];  Service: Neurosurgery;  Laterality: N/A;  LUMBAR SPINAL CORD STIMULATOR INSERTION  . TUBAL LIGATION    . UMBILICAL HERNIA REPAIR  1995   [facility]    Current Outpatient Rx  . Order #: [id number] Class: Historical Med  . Order #: [id number] Class: Historical Med  . Order #: [id number] Class: Historical Med  . Order #: [id number] Class: Historical Med  . Order #: [id number] Class: Historical Med  . Order #: [id number] Class: Historical Med  . Order #: [id number] Class: Historical Med  . Order #: [id number] Class: Historical Med  . Order #: [id number] Class: Historical Med  . Order #: [id number] Class: Historical Med  . Order #: [id number] Class: Print  . Order #: [id number] Class: Print  . Order #: [id number] Class: Print    Allergies Vicodin [hydrocodone-acetaminophen]; Acetaminophen; Hydrocil [psyllium]; and Percocet [oxycodone-acetaminophen]  Family History  Problem Relation Age of Onset  . Crohn's disease Maternal Aunt   . Rheum arthritis Mother   . Diabetes Mother   . Colon cancer Neg Hx     Social History Social History   Tobacco Use  . Smoking status: Current Every Day Smoker    Packs/day: 0.50    Years: 20.00    Pack years: 10.00    Types: Cigarettes  . Smokeless tobacco: Never Used  Substance Use Topics  . Alcohol use: No  . Drug use: Yes  Types: Marijuana    Comment: none since 07/2016    Review of Systems  All other systems negative except as documented in the HPI. All pertinent positives and negatives as reviewed in the HPI. ____________________________________________   PHYSICAL EXAM:  VITAL SIGNS: ED Triage Vitals  Enc Vitals Group     BP 05/10/17 1621 (!) 152/101     Pulse Rate 05/10/17 1621 95     Resp 05/10/17 1621 16     Temp 05/10/17 1621 98.5 F (36.9 C)     Temp Source 05/10/17 1621 Oral     SpO2 05/10/17 1621 100 %     Weight 05/10/17 1621 230 lb (104.3 kg)     Height  05/10/17 1621 5' (1.524 m)     Head Circumference --      Peak Flow --      Pain Score 05/10/17 1624 0     Pain Loc --      Pain Edu? --      Excl. in [place]? --     Constitutional: Alert and oriented. Well appearing and in no acute distress. Eyes: Conjunctivae are normal. PERRL. EOMI. Head: Atraumatic. Nose: No congestion/rhinnorhea. Mouth/Throat: Mucous membranes are moist.  Oropharynx non-erythematous. Neck: No stridor.  No meningeal signs.   Cardiovascular: Normal rate, regular rhythm. Good peripheral circulation. Grossly normal heart sounds.   Respiratory: Normal respiratory effort.  No retractions. Lungs significantly diminished with some wheezing and transmitted upper airway sounds. Gastrointestinal: Soft and nontender. No distention.  Musculoskeletal: No lower extremity tenderness nor edema. No gross deformities of extremities. Neurologic:  Normal speech and language. No gross focal neurologic deficits are appreciated.  Skin:  Skin is warm, dry and intact. No rash noted.   ____________________________________________   LABS (all labs ordered are listed, but only abnormal results are displayed)  Labs Reviewed  CBC WITH DIFFERENTIAL/PLATELET - Abnormal; Notable for the following components:      Result Value   Hemoglobin 10.0 (*)    HCT 33.8 (*)    MCV 66.4 (*)    MCH 19.6 (*)    MCHC 29.6 (*)    RDW 18.8 (*)    Platelets 447 (*)    Neutro Abs 8.0 (*)    All other components within normal limits  COMPREHENSIVE METABOLIC PANEL - Abnormal; Notable for the following components:   Potassium 3.4 (*)    Glucose, Bld 123 (*)    Albumin 3.4 (*)    All other components within normal limits  TROPONIN I   ____________________________________________  EKG   EKG Interpretation  Date/Time:    Ventricular Rate:    PR Interval:    QRS Duration:   QT Interval:    QTC Calculation:   R Axis:     Text Interpretation:          ____________________________________________  RADIOLOGY  Dg Chest Portable 1 View  Result Date: 05/10/2017 CLINICAL DATA:  Shortness of breath for several days EXAM: PORTABLE CHEST 1 VIEW COMPARISON:  09/16/2013 FINDINGS: The heart size and mediastinal contours are within normal limits. Both lungs are clear. The visualized skeletal structures are unremarkable. Spinal stimulator is noted in midthoracic spine. IMPRESSION: No active disease. Electronically Signed   By: Inez Catalina M.D.   On: 05/10/2017 17:13    ____________________________________________   PROCEDURES  Procedure(s) performed:   Procedures   Counseled patient for approximately 9 minutes regarding smoking cessation. Discussed risks of smoking and how they applied and affected their visit here today.  Patient somewhat ready to quit at this time, and will try Chantix. Rx will be provided.   CPT code: [phone]: intermediate counseling for smoking cessation  ____________________________________________   INITIAL IMPRESSION / ASSESSMENT AND PLAN / ED COURSE  Likely bronchitis. Will treat/eval for same. Ecg/trop to ensure no cardiac cause.   Persistent improvement in dyspnea, RR and improvement in aeration. Likely bronchitis.  Will dc on abx/steroids/chantix.   Pertinent labs & imaging results that were available during my care of the patient were reviewed by me and considered in my medical decision making (see chart for details).  ____________________________________________  FINAL CLINICAL IMPRESSION(S) / ED DIAGNOSES  Final diagnoses:  Bronchitis     MEDICATIONS GIVEN DURING THIS VISIT:  Medications  ipratropium-albuterol (DUONEB) 0.5-2.5 (3) MG/3ML nebulizer solution 3 mL (3 mLs Nebulization Not Given 05/10/17 1951)  guaiFENesin (MUCINEX) 12 hr tablet 600 mg (600 mg Oral Given 05/10/17 1944)  aspirin-acetaminophen-caffeine (EXCEDRIN MIGRAINE) per tablet 1 tablet (1 tablet Oral Not Given 05/10/17 2030)   ipratropium-albuterol (DUONEB) 0.5-2.5 (3) MG/3ML nebulizer solution 3 mL (3 mLs Nebulization Given 05/10/17 1709)  predniSONE (DELTASONE) tablet 60 mg (60 mg Oral Given 05/10/17 1707)  magnesium sulfate IVPB 2 g 50 mL (0 g Intravenous Stopped 05/10/17 1841)  levofloxacin (LEVAQUIN) tablet 750 mg (750 mg Oral Given 05/10/17 1944)  albuterol (PROVENTIL HFA;VENTOLIN HFA) 108 (90 Base) MCG/ACT inhaler 2 puff (2 puffs Inhalation Given 05/10/17 1949)     NEW OUTPATIENT MEDICATIONS STARTED DURING THIS VISIT:  Discharge Medication List as of 05/10/2017  8:17 PM    START taking these medications   Details  levofloxacin (LEVAQUIN) 750 MG tablet Take 1 tablet (750 mg total) by mouth daily. X 7 days, Starting Sat 05/10/2017, Print    varenicline (CHANTIX) 0.5 MG tablet Take 1 tablet (0.5 mg total) by mouth 2 (two) times daily., Starting Sat 05/10/2017, Print        Note:  This note was prepared with assistance of Dragon voice recognition software. Occasional wrong-word or sound-a-like substitutions may have occurred due to the inherent limitations of voice recognition software.   Merrily Pew, MD 05/10/17 2050

## 2017-05-10 NOTE — ED Notes (Signed)
Pt has a spinal cord stimulator for which she does not have the controller for at this time.  Unable to obtain clear EKG due to electrical interference.  MD aware.

## 2017-05-10 NOTE — ED Notes (Signed)
Unable to obtain IV access at this time.  Another RN to try.

## 2017-05-10 NOTE — ED Triage Notes (Signed)
Patient complains of shortness of breath that started 3 days ago. She states that she uses an inhaler that usually helps her. She states it has not been helping her and she feels like she is being suffocated.

## 2017-05-12 IMAGING — DX DG LUMBAR SPINE COMPLETE 4+V
5 series · 5 of 5 positions shown · non-contrast
Comparison: 10/24/2012

CLINICAL DATA: Tripped and fell 2 days ago, complaining of RIGHT
low back pain, pain at RIGHT hip, RIGHT knee and RIGHT lower leg,
history degenerative disc disease, hypertension, smoking

EXAM:
LUMBAR SPINE - COMPLETE 4+ VIEW

[l-spine ap]
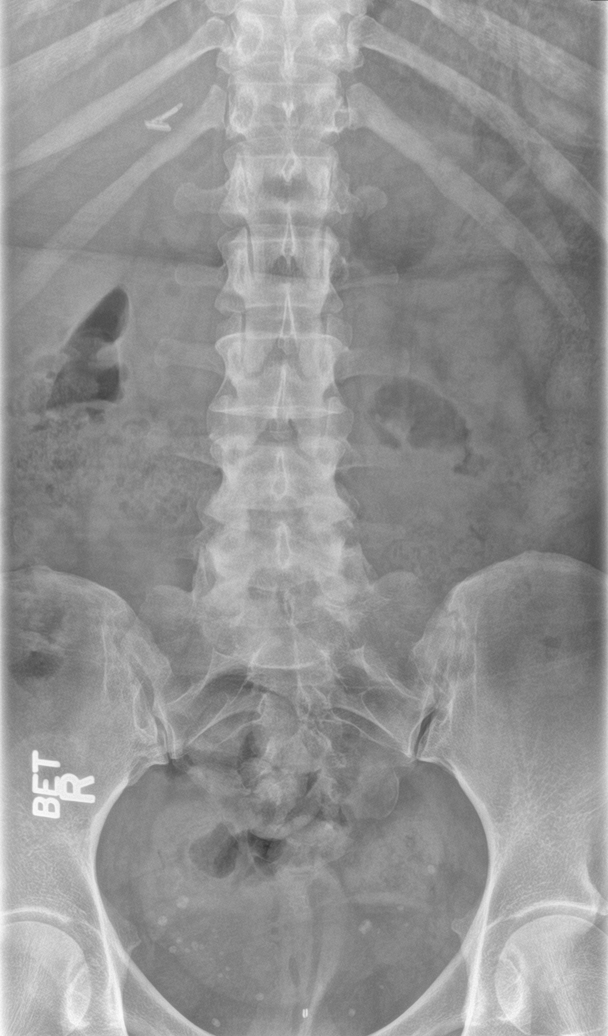

[l-spine obl (1 of 2)]
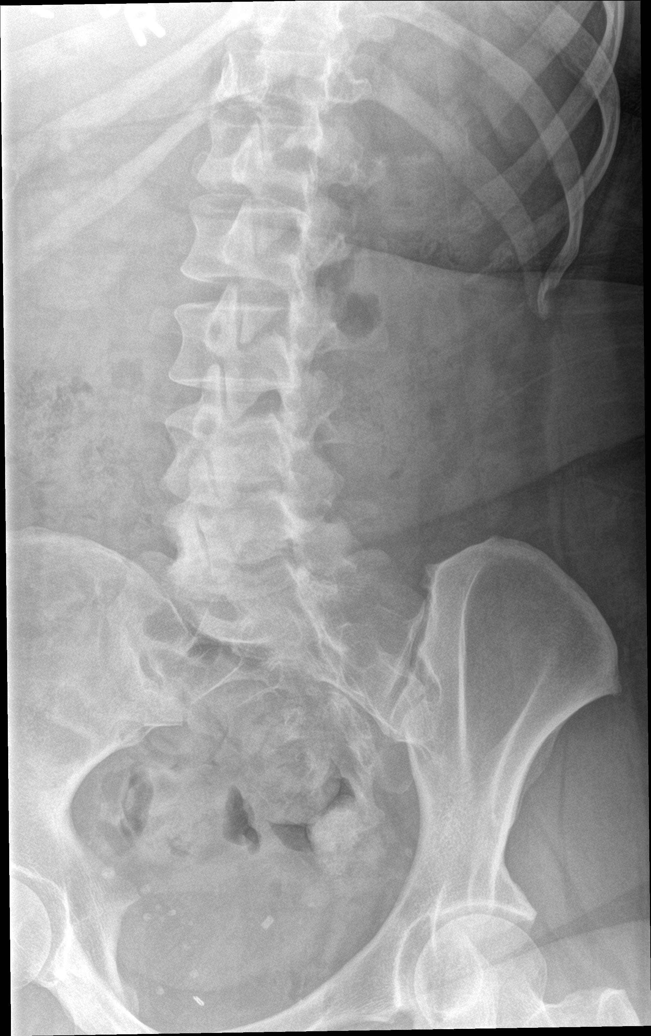

[l-spine obl (2 of 2)]
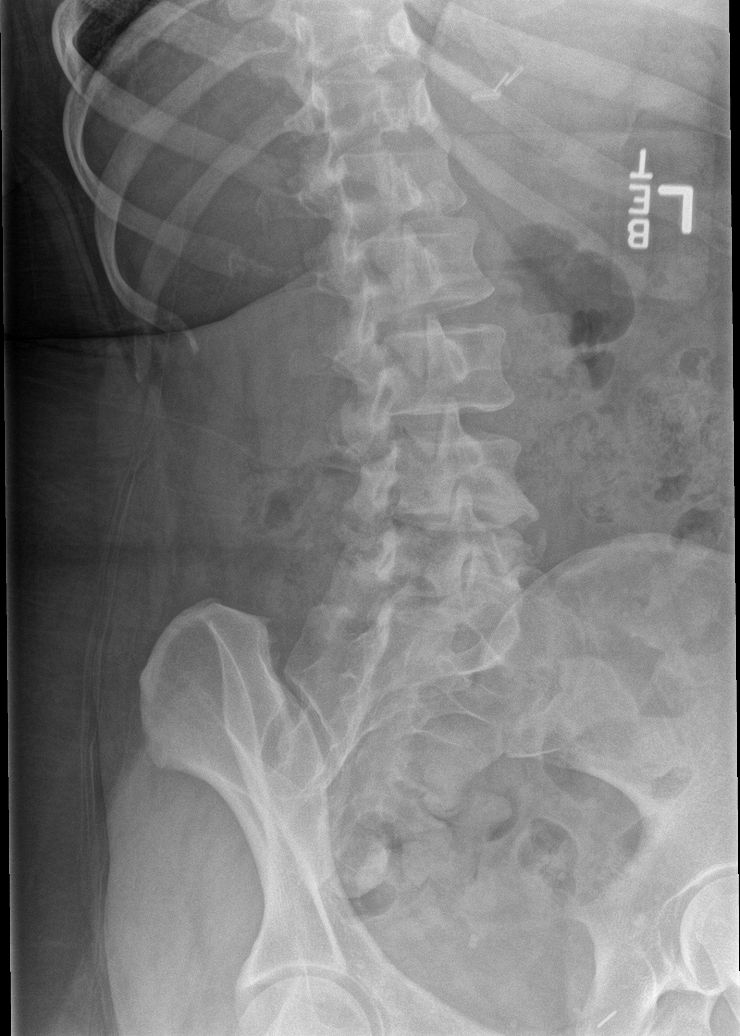

[l-spine lat]
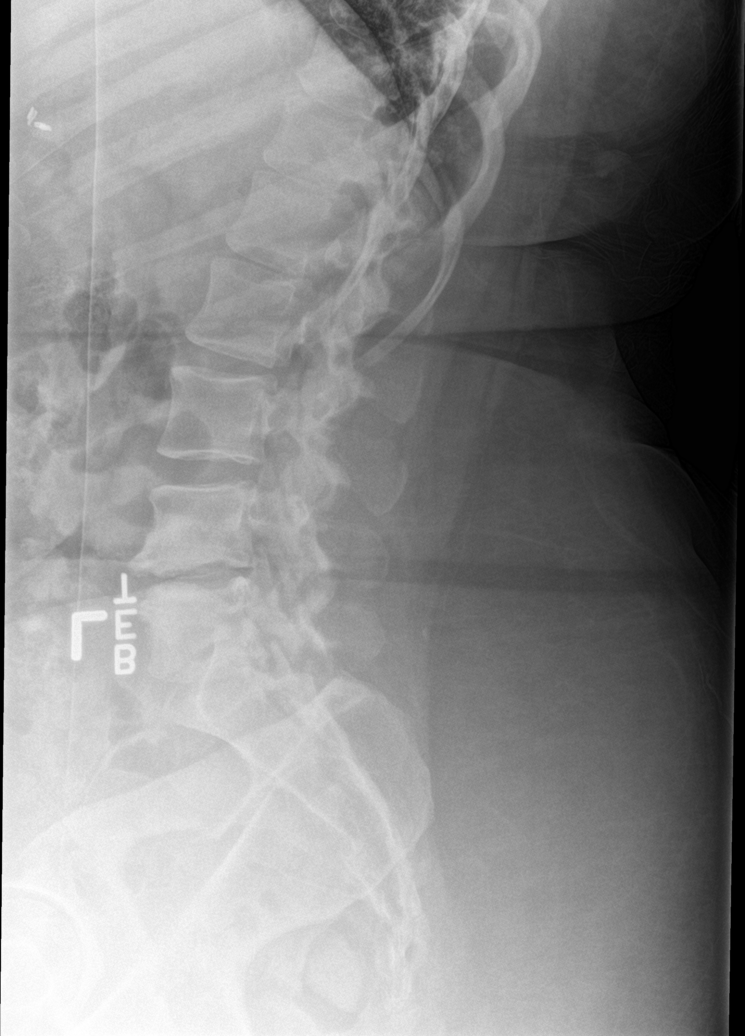

[l-spine spot]
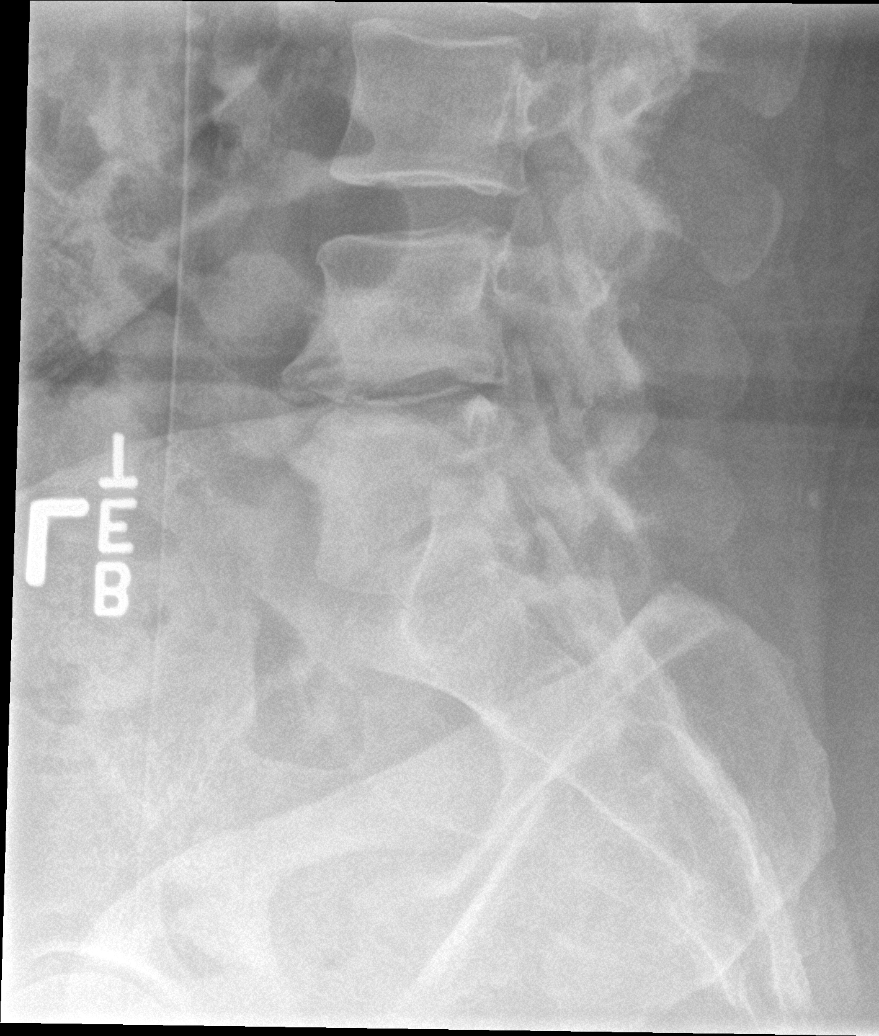

[5 of 5 positions shown; findings below may reference images not displayed]

FINDINGS: Five non-rib-bearing lumbar vertebra.

Disc space narrowing and endplate spur formation L4-L5.

Vertebral body and disc space heights otherwise maintained.

Mild retrolisthesis L5-S1 unchanged.

No acute fracture, subluxation or bone destruction.

SI joints symmetric.

Numerous pelvic phleboliths.

Surgical clips RIGHT upper quadrant question cholecystectomy.
IMPRESSION: Degenerative disc disease changes L4-L5.

No acute abnormalities.

## 2017-05-12 IMAGING — DX DG ANKLE COMPLETE 3+V*R*
3 series · 3 of 3 positions shown · non-contrast
Comparison: None

CLINICAL DATA: Tripped and fell 2 days ago, pain at RIGHT lower
back, RIGHT hip, RIGHT knee and RIGHT lower leg, history
degenerative disc disease

EXAM:
RIGHT ANKLE - COMPLETE 3+ VIEW

[ankle ap]
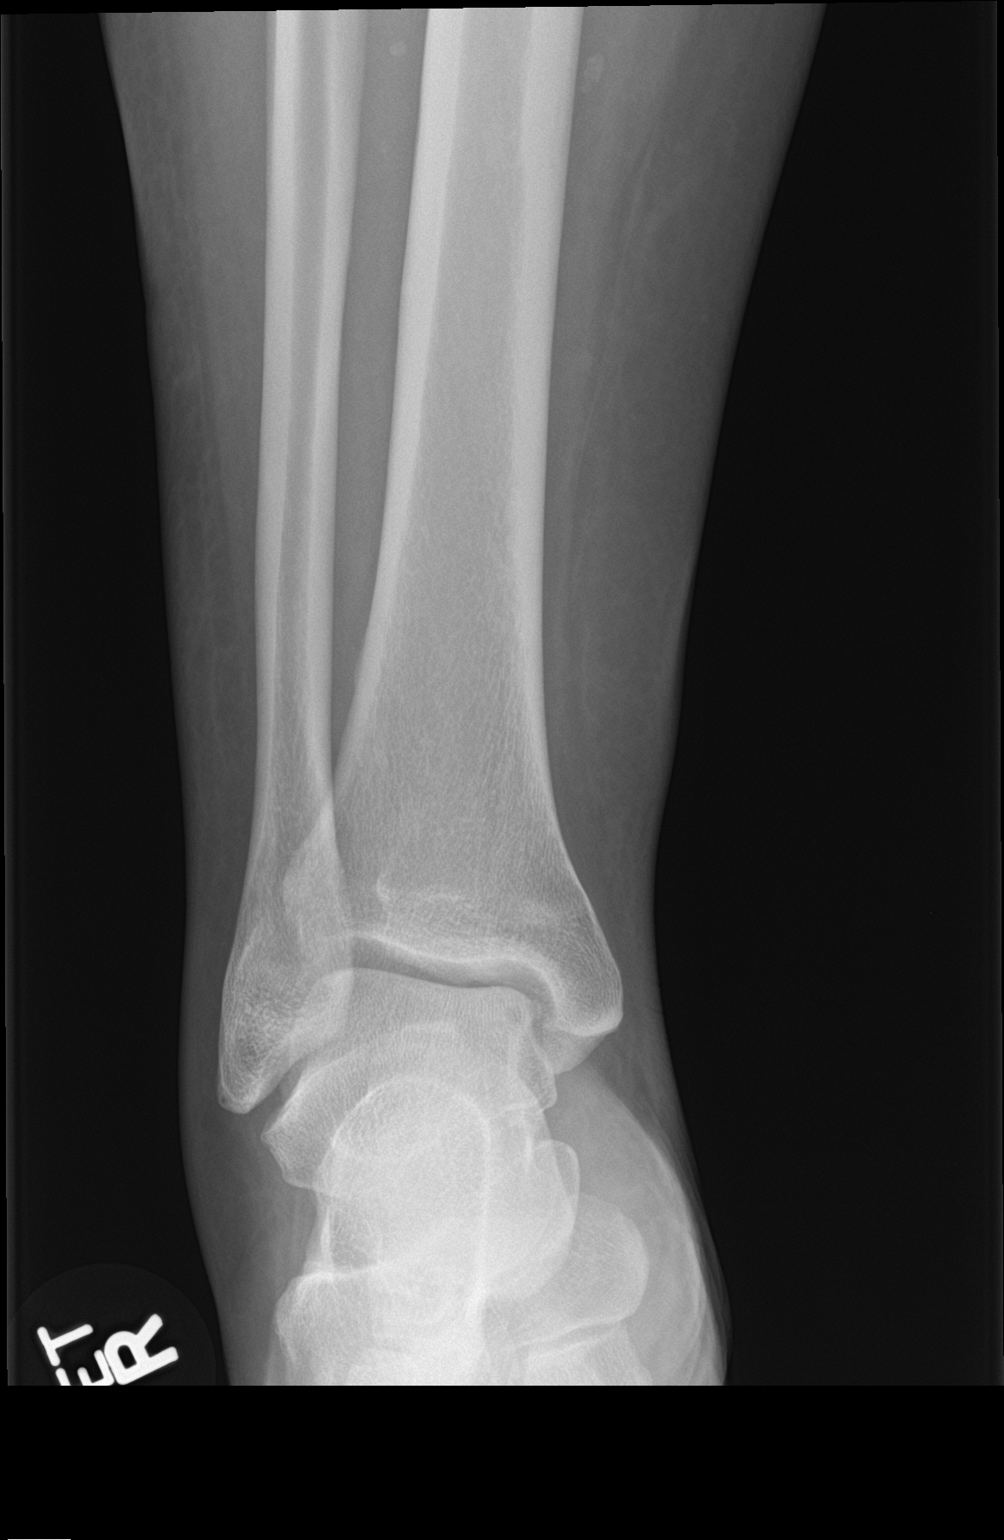

[ankle obl]
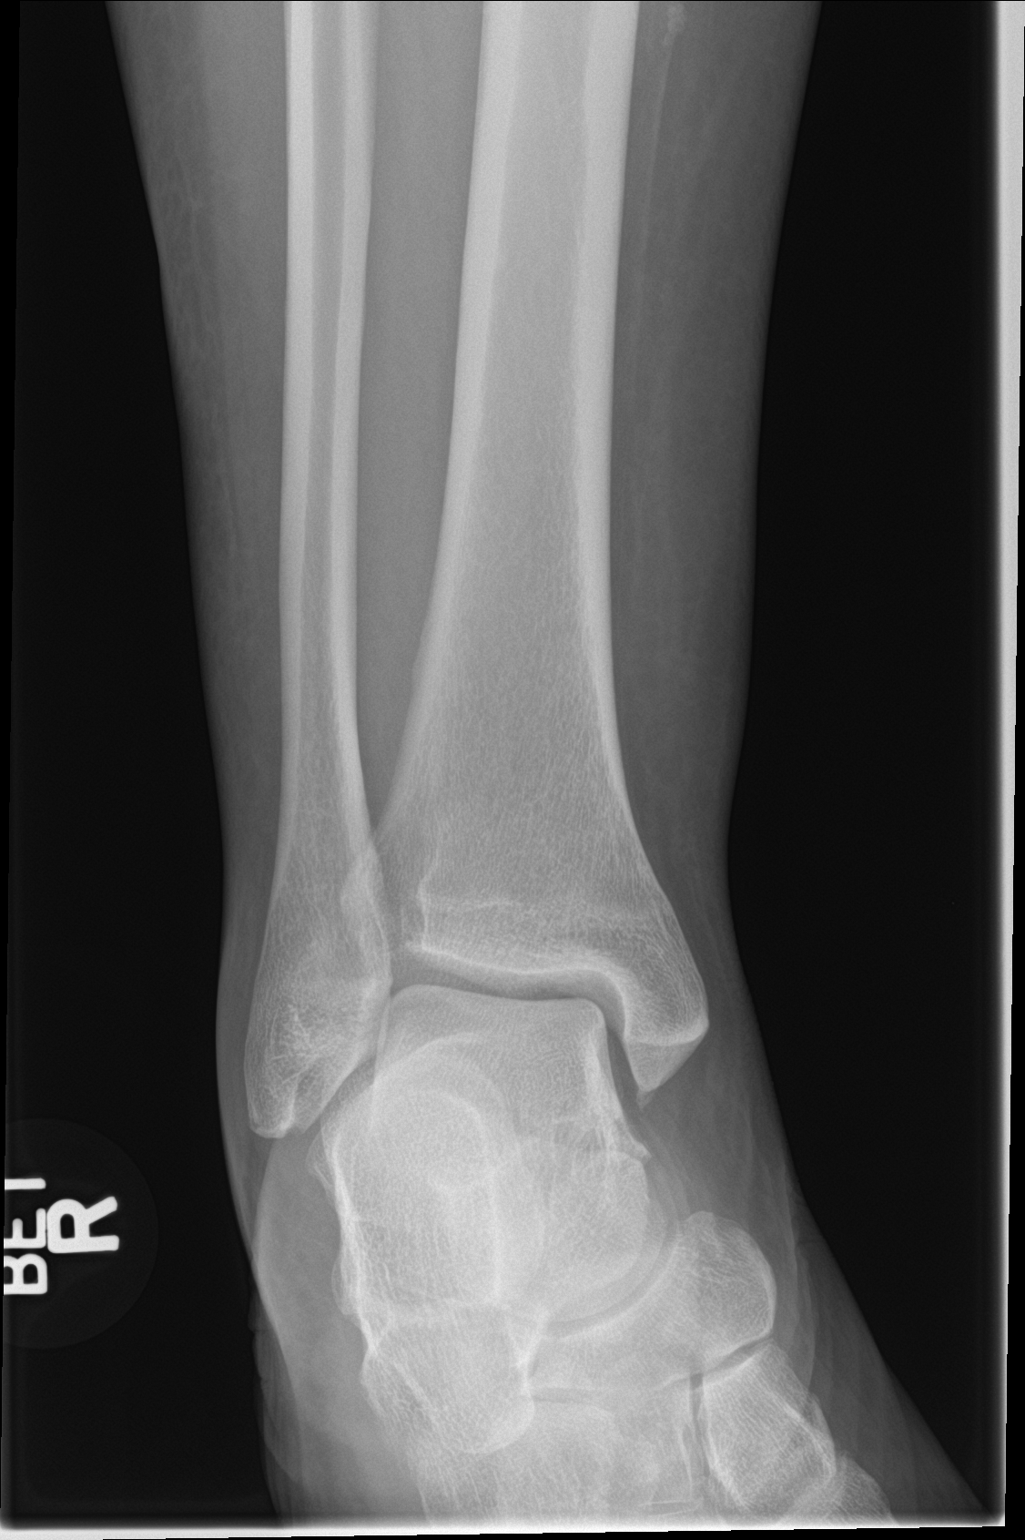

[ankle lat]
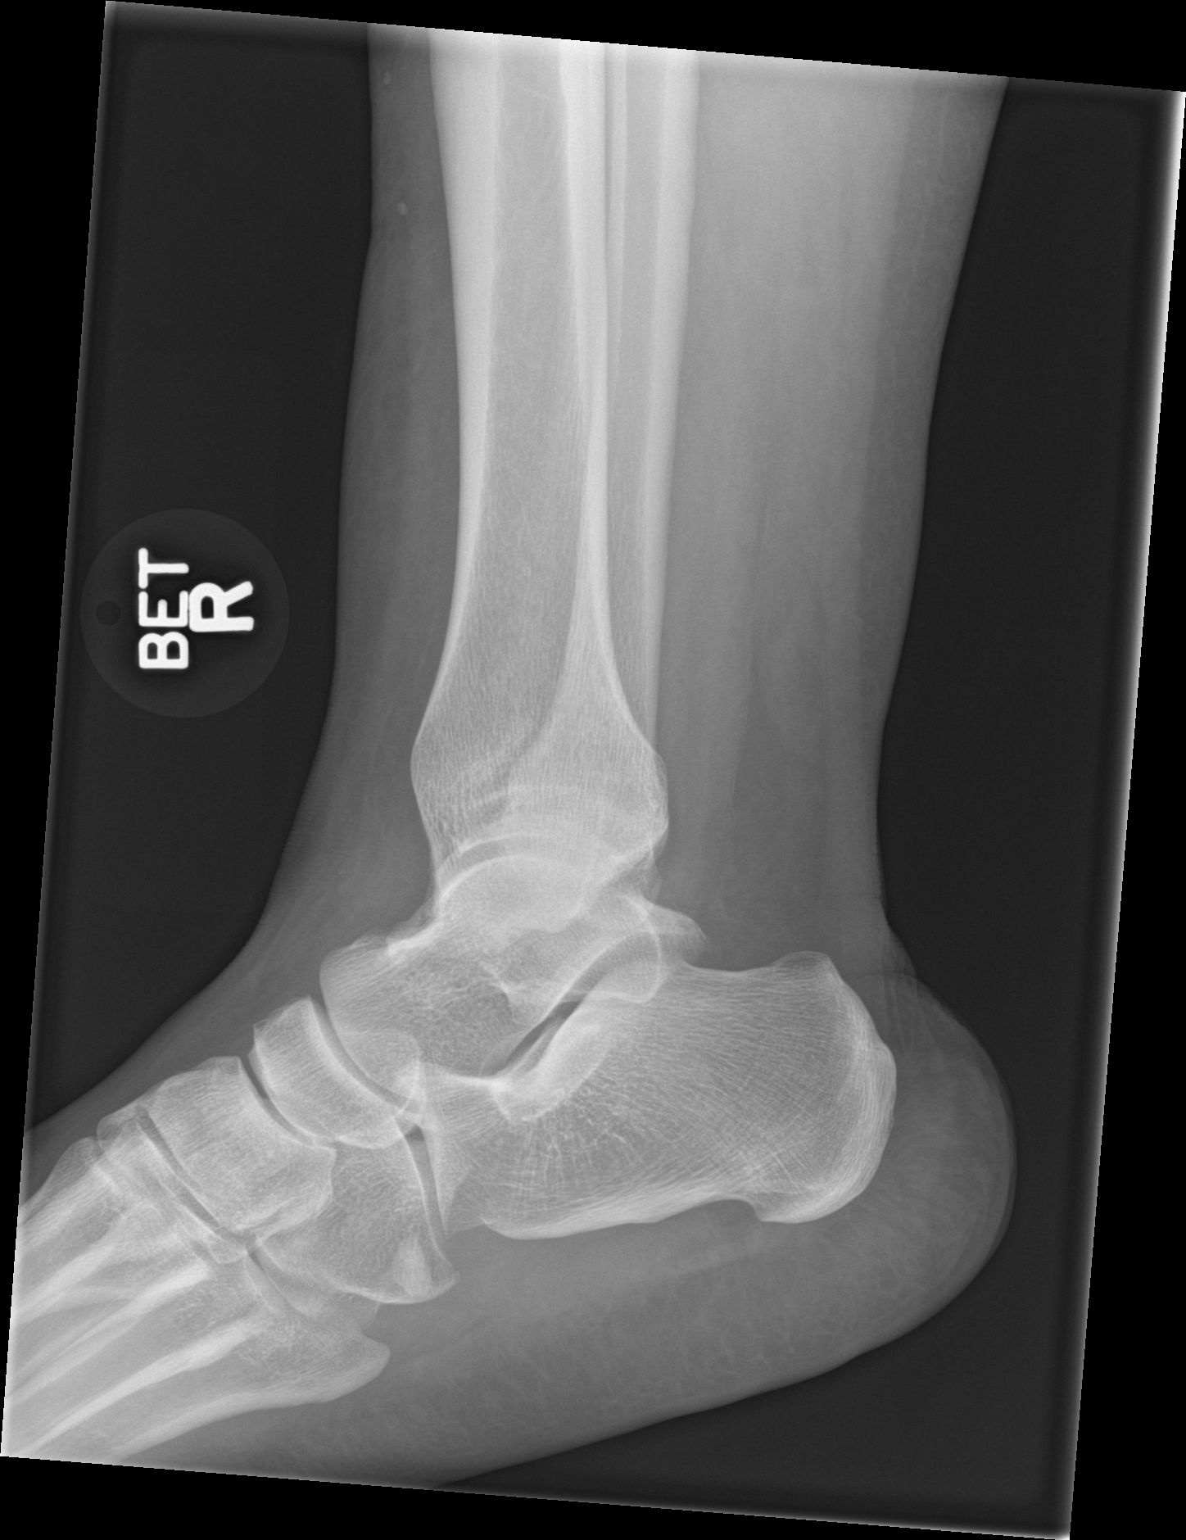

[3 of 3 positions shown; findings below may reference images not displayed]

FINDINGS: Osseous mineralization normal.

Joint spaces preserved.

Small spur at tip of medial malleolus.

No acute fracture, dislocation, or bone destruction.
IMPRESSION: Normal exam.

## 2017-05-15 ENCOUNTER — Other Ambulatory Visit (HOSPITAL_COMMUNITY): Payer: Self-pay | Admitting: Neurosurgery

## 2017-05-15 ENCOUNTER — Other Ambulatory Visit: Payer: Self-pay | Admitting: Neurosurgery

## 2017-05-15 DIAGNOSIS — M48061 Spinal stenosis, lumbar region without neurogenic claudication: Secondary | ICD-10-CM

## 2017-05-22 MED ORDER — SODIUM CHLORIDE 0.9 % IV SOLN: 4.0000 mg | Freq: Four times a day (QID) | INTRAVENOUS | Status: AC | PRN

## 2017-05-29 ENCOUNTER — Other Ambulatory Visit: Payer: Self-pay

## 2017-05-29 ENCOUNTER — Encounter: Payer: Self-pay | Admitting: Oncology

## 2017-05-29 ENCOUNTER — Inpatient Hospital Stay: Payer: 59 | Attending: Oncology | Admitting: Oncology

## 2017-05-29 ENCOUNTER — Inpatient Hospital Stay: Payer: 59

## 2017-05-29 VITALS — BP 111/73 | HR 91 | Temp 98.1°F | Resp 16 | Wt 236.0 lb

## 2017-05-29 DIAGNOSIS — R5381 Other malaise: Secondary | ICD-10-CM | POA: Diagnosis not present

## 2017-05-29 DIAGNOSIS — Z79899 Other long term (current) drug therapy: Secondary | ICD-10-CM | POA: Insufficient documentation

## 2017-05-29 DIAGNOSIS — D473 Essential (hemorrhagic) thrombocythemia: Secondary | ICD-10-CM | POA: Diagnosis not present

## 2017-05-29 DIAGNOSIS — N920 Excessive and frequent menstruation with regular cycle: Secondary | ICD-10-CM | POA: Diagnosis not present

## 2017-05-29 DIAGNOSIS — D649 Anemia, unspecified: Secondary | ICD-10-CM | POA: Diagnosis present

## 2017-05-29 DIAGNOSIS — I10 Essential (primary) hypertension: Secondary | ICD-10-CM

## 2017-05-29 DIAGNOSIS — D509 Iron deficiency anemia, unspecified: Secondary | ICD-10-CM

## 2017-05-29 DIAGNOSIS — F319 Bipolar disorder, unspecified: Secondary | ICD-10-CM | POA: Diagnosis not present

## 2017-05-29 DIAGNOSIS — F17210 Nicotine dependence, cigarettes, uncomplicated: Secondary | ICD-10-CM | POA: Insufficient documentation

## 2017-05-29 DIAGNOSIS — D75839 Thrombocytosis, unspecified: Secondary | ICD-10-CM

## 2017-05-29 DIAGNOSIS — F5089 Other specified eating disorder: Secondary | ICD-10-CM | POA: Diagnosis not present

## 2017-05-29 DIAGNOSIS — M1990 Unspecified osteoarthritis, unspecified site: Secondary | ICD-10-CM | POA: Insufficient documentation

## 2017-05-29 DIAGNOSIS — Z7982 Long term (current) use of aspirin: Secondary | ICD-10-CM | POA: Diagnosis not present

## 2017-05-29 DIAGNOSIS — R5383 Other fatigue: Secondary | ICD-10-CM | POA: Diagnosis not present

## 2017-05-29 DIAGNOSIS — K219 Gastro-esophageal reflux disease without esophagitis: Secondary | ICD-10-CM | POA: Diagnosis not present

## 2017-05-29 DIAGNOSIS — M797 Fibromyalgia: Secondary | ICD-10-CM | POA: Diagnosis not present

## 2017-05-29 NOTE — Progress Notes (Signed)
Hematology/Oncology Consult note [facility] Telephone:([phone] [phone]   Patient Care Team: Remi Haggard, FNP as PCP - General (Family Medicine) Gala Romney Cristopher Estimable, MD as Consulting Physician (Gastroenterology)  REFERRING PROVIDER: Remi Haggard, FNP CHIEF COMPLAINTS/PURPOSE OF CONSULTATION:  Evaluation of anemia.   HISTORY OF PRESENTING ILLNESS:  [name] is a  42 y.o.  female with PMH listed below who was referred to me for evaluation of anemia. Most recent cbc showed hemoglobin 10, MCV 66.4, platelet 447.   History of iron deficiency: Denies. Rectal bleeding: denies Menstrual bleeding/ Vaginal bleeding : heavy menstrual period, uses a whole bag of pads for the first 3 days of period.  Hematemesis or hemoptysis : none  Blood in urine : none  Pica: craves for ice chips.  Last endoscopy: never.  Fatigue: yes.    Review of Systems  Constitutional: Positive for malaise/fatigue. Negative for chills, fever and weight loss.  HENT: Negative for congestion, ear discharge, ear pain, nosebleeds, sinus pain and sore throat.   Eyes: Negative for double vision, photophobia, pain, discharge and redness.  Respiratory: Negative for cough, hemoptysis, sputum production, shortness of breath and wheezing.   Cardiovascular: Negative for chest pain, palpitations, orthopnea, claudication and leg swelling.  Gastrointestinal: Negative for abdominal pain, blood in stool, constipation, diarrhea, heartburn, melena, nausea and vomiting.  Genitourinary: Negative for dysuria, flank pain, frequency and hematuria.  Musculoskeletal: Negative for back pain, myalgias and neck pain.  Skin: Negative for itching and rash.  Neurological: Negative for dizziness, tingling, tremors, focal weakness, weakness and headaches.  Endo/Heme/Allergies: Negative for environmental allergies. Does not bruise/bleed easily.  Psychiatric/Behavioral: Negative for depression and  hallucinations. The patient is not nervous/anxious.     MEDICAL HISTORY:  Past Medical History:  Diagnosis Date  . Bipolar 1 disorder ([place])   . Fibromyalgia   . GERD (gastroesophageal reflux disease)   . History of degenerative disc disease   . Hypertension   . Manic depression ([place])   . osteoarthritis    osteoarthritis  . Sciatica     SURGICAL HISTORY: Past Surgical History:  Procedure Laterality Date  . ANKLE SURGERY Left    ORIF  . BACK SURGERY     x3  . CHOLECYSTECTOMY  1998   Dr. Anthony Sar  . ERCP  2005   Dr. Laural Golden: s/p sphincerotomy with removal of sludge and debris  . LUMBAR WOUND DEBRIDEMENT N/A 08/16/2015   Procedure: Spinal Cord Stimulator Revision;  Surgeon: Ashok Pall, MD;  Location: [facility];  Service: Neurosurgery;  Laterality: N/A;  . SPINAL CORD STIMULATOR INSERTION N/A 08/16/2015   Procedure: LUMBAR SPINAL CORD STIMULATOR INSERTION;  Surgeon: Ashok Pall, MD;  Location: [facility];  Service: Neurosurgery;  Laterality: N/A;  LUMBAR SPINAL CORD STIMULATOR INSERTION  . TUBAL LIGATION    . UMBILICAL HERNIA REPAIR  1995   [facility]    SOCIAL HISTORY: Social History   Socioeconomic History  . Marital status: Married    Spouse name: Not on file  . Number of children: 2  . Years of education: Not on file  . Highest education level: Not on file  Occupational History  . Occupation: disabled  Social Needs  . Financial resource strain: Not on file  . Food insecurity:    Worry: Not on file    Inability: Not on file  . Transportation needs:    Medical: Not on file    Non-medical: Not on file  Tobacco Use  .  Smoking status: Current Every Day Smoker    Packs/day: 0.50    Years: 20.00    Pack years: 10.00    Types: Cigarettes  . Smokeless tobacco: Never Used  Substance and Sexual Activity  . Alcohol use: No  . Drug use: Yes    Types: Marijuana    Comment: none since 07/2016  . Sexual activity: Yes    Birth control/protection: Surgical    Lifestyle  . Physical activity:    Days per week: Not on file    Minutes per session: Not on file  . Stress: Not on file  Relationships  . Social connections:    Talks on phone: Not on file    Gets together: Not on file    Attends religious service: Not on file    Active member of club or organization: Not on file    Attends meetings of clubs or organizations: Not on file    Relationship status: Not on file  . Intimate partner violence:    Fear of current or ex partner: Not on file    Emotionally abused: Not on file    Physically abused: Not on file    Forced sexual activity: Not on file  Other Topics Concern  . Not on file  Social History Narrative  . Not on file    FAMILY HISTORY: Family History  Problem Relation Age of Onset  . Crohn's disease Maternal Aunt   . Rheum arthritis Mother   . Diabetes Mother   . Clotting disorder Mother   . Colon cancer Neg Hx     ALLERGIES:  is allergic to vicodin [hydrocodone-acetaminophen]; acetaminophen; hydrocil [psyllium]; and percocet [oxycodone-acetaminophen].  MEDICATIONS:  Current Outpatient Medications  Medication Sig Dispense Refill  . ALPRAZolam (XANAX) 1 MG tablet Take 1 mg by mouth 3 (three) times daily as needed for anxiety.    Marland Kitchen amitriptyline (ELAVIL) 150 MG tablet Take 1 tablet by mouth at bedtime.    . Aspirin-Acetaminophen-Caffeine (EXCEDRIN PO) Take 1-2 tablets by mouth 2 (two) times daily as needed (headaches).     . baclofen (LIORESAL) 10 MG tablet Take 10 mg by mouth 2 (two) times daily.     . Buprenorphine HCl-Naloxone HCl (SUBOXONE) 8-2 MG FILM Place 1 Film under the tongue 3 (three) times daily.    Marland Kitchen DEXILANT 60 MG capsule Take 60 mg by mouth daily.    Marland Kitchen gabapentin (NEURONTIN) 600 MG tablet Take 600 mg by mouth 2 (two) times daily.     Marland Kitchen lisinopril-hydrochlorothiazide (PRINZIDE,ZESTORETIC) 20-25 MG tablet Take 1 tablet by mouth daily.    Marland Kitchen umeclidinium-vilanterol (ANORO ELLIPTA) 62.5-25 MCG/INH AEPB Inhale 1  puff into the lungs daily.    . varenicline (CHANTIX) 0.5 MG tablet Take 1 tablet (0.5 mg total) by mouth 2 (two) times daily. 60 tablet 0   No current facility-administered medications for this visit.    Facility-Administered Medications Ordered in Other Visits  Medication Dose Route Frequency Provider Last Rate Last Dose  . ondansetron (ZOFRAN) 4 mg in sodium chloride 0.9 % 50 mL IVPB  4 mg Intravenous Q6H PRN Ashok Pall, MD         PHYSICAL EXAMINATION: ECOG PERFORMANCE STATUS: 1 - Symptomatic but completely ambulatory Vitals:   05/29/17 1430  BP: 111/73  Pulse: 91  Resp: 16  Temp: 98.1 F (36.7 C)   Filed Weights   05/29/17 1430  Weight: 236 lb (107 kg)    Physical Exam  Constitutional: She is oriented to person,  place, and time. She appears well-developed and well-nourished. No distress.  HENT:  Head: Normocephalic and atraumatic.  Right Ear: External ear normal.  Left Ear: External ear normal.  Mouth/Throat: Oropharynx is clear and moist.  Eyes: Pupils are equal, round, and reactive to light. EOM are normal. No scleral icterus.  Neck: Normal range of motion. Neck supple.  Cardiovascular: Normal rate, regular rhythm and normal heart sounds.  Pulmonary/Chest: Effort normal and breath sounds normal. No respiratory distress. She has no wheezes. She has no rales. She exhibits no tenderness.  Abdominal: Soft. Bowel sounds are normal. She exhibits no distension and no mass. There is no tenderness.  Musculoskeletal: Normal range of motion. She exhibits no edema or deformity.  Lymphadenopathy:    She has no cervical adenopathy.  Neurological: She is alert and oriented to person, place, and time. No cranial nerve deficit. Coordination normal.  Skin: Skin is warm and dry. No rash noted.  Psychiatric: She has a normal mood and affect. Her behavior is normal. Thought content normal.     LABORATORY DATA:  I have reviewed the data as listed Lab Results  Component Value  Date   WBC 10.1 05/10/2017   HGB 10.0 (L) 05/10/2017   HCT 33.8 (L) 05/10/2017   MCV 66.4 (L) 05/10/2017   PLT 447 (H) 05/10/2017   Recent Labs    12/19/16 1348 05/10/17 1738  NA 136 137  K 3.4* 3.4*  CL 101 104  CO2 27 22  GLUCOSE 72 123*  BUN 17 8  CREATININE 0.90 0.64  CALCIUM 9.2 8.9  GFRNONAA >60 >60  GFRAA >60 >60  PROT  --  7.1  ALBUMIN  --  3.4*  AST  --  18  ALT  --  14  ALKPHOS  --  72  BILITOT  --  0.4       ASSESSMENT & PLAN:  1. Anemia, unspecified type   2. Thrombocytosis ([place])   3. Microcytic anemia     Discussed with patient that I suspect she has iron deficiency anemia. Will check iron, tibc and ferritin, tsh.  If test is consistent with iron deficiency, I recommend IV iron infusion to replete iron store, if indicated,  IV iron with Venofer 200mg  twice a week for 4 doses. Allergy reactions/infusion reaction including anaphylactic reaction discussed with patient. Patient voices understanding and willing to proceed if needed.  # Advise patient to establish care with GYN for menorrahgia work up and management.   All questions were answered. The patient knows to call the clinic with any problems questions or concerns.  Return of visit: 5 weeks.  Thank you for this kind referral and the opportunity to participate in the care of this patient. A copy of today's note is routed to referring provider    Earlie Server, MD, PhD Hematology Oncology [facility] Pager- [phone] 05/29/2017

## 2017-05-30 ENCOUNTER — Other Ambulatory Visit: Payer: Self-pay

## 2017-05-30 DIAGNOSIS — D649 Anemia, unspecified: Secondary | ICD-10-CM

## 2017-06-02 ENCOUNTER — Inpatient Hospital Stay: Payer: 59

## 2017-06-04 ENCOUNTER — Ambulatory Visit (HOSPITAL_COMMUNITY): Payer: 59 | Attending: Neurosurgery

## 2017-06-04 ENCOUNTER — Ambulatory Visit (HOSPITAL_COMMUNITY): Admission: RE | Admit: 2017-06-04 | Payer: Medicare HMO | Source: Ambulatory Visit

## 2017-06-04 ENCOUNTER — Encounter (HOSPITAL_COMMUNITY): Payer: Self-pay

## 2017-07-01 ENCOUNTER — Inpatient Hospital Stay: Payer: 59 | Attending: Oncology

## 2017-07-02 ENCOUNTER — Other Ambulatory Visit: Payer: Self-pay | Admitting: Oncology

## 2017-07-02 ENCOUNTER — Ambulatory Visit: Payer: 59

## 2017-07-02 DIAGNOSIS — D509 Iron deficiency anemia, unspecified: Secondary | ICD-10-CM | POA: Insufficient documentation

## 2017-07-03 ENCOUNTER — Ambulatory Visit: Payer: 59 | Admitting: Oncology

## 2017-07-03 ENCOUNTER — Other Ambulatory Visit: Payer: 59

## 2017-07-03 ENCOUNTER — Ambulatory Visit: Payer: 59

## 2017-07-10 ENCOUNTER — Inpatient Hospital Stay: Payer: 59 | Attending: Oncology

## 2017-07-10 ENCOUNTER — Other Ambulatory Visit: Payer: Self-pay

## 2017-07-10 DIAGNOSIS — D649 Anemia, unspecified: Secondary | ICD-10-CM

## 2017-07-10 DIAGNOSIS — D75839 Thrombocytosis, unspecified: Secondary | ICD-10-CM

## 2017-07-10 DIAGNOSIS — D473 Essential (hemorrhagic) thrombocythemia: Secondary | ICD-10-CM

## 2017-07-10 DIAGNOSIS — D509 Iron deficiency anemia, unspecified: Secondary | ICD-10-CM

## 2017-07-10 LAB — TSH: TSH: 0.339 u[IU]/mL — ABNORMAL LOW (ref 0.350–4.500)

## 2017-07-10 LAB — IRON AND TIBC
IRON: 9 ug/dL — AB (ref 28–170)
SATURATION RATIOS: 2 % — AB (ref 10.4–31.8)
TIBC: 381 ug/dL (ref 250–450)
UIBC: 372 ug/dL

## 2017-07-10 LAB — FERRITIN: FERRITIN: 6 ng/mL — AB (ref 11–307)

## 2017-07-11 ENCOUNTER — Inpatient Hospital Stay: Payer: 59 | Admitting: Oncology

## 2017-07-11 ENCOUNTER — Inpatient Hospital Stay: Payer: 59

## 2017-07-14 ENCOUNTER — Ambulatory Visit (INDEPENDENT_AMBULATORY_CARE_PROVIDER_SITE_OTHER): Payer: 59 | Admitting: Podiatry

## 2017-07-14 ENCOUNTER — Encounter: Payer: Self-pay | Admitting: Podiatry

## 2017-07-14 VITALS — BP 106/68 | HR 92

## 2017-07-14 DIAGNOSIS — B351 Tinea unguium: Secondary | ICD-10-CM

## 2017-07-14 DIAGNOSIS — M79675 Pain in left toe(s): Secondary | ICD-10-CM

## 2017-07-14 NOTE — Progress Notes (Signed)
This patient presents to this office stating that her second and third toenails on her left foot have turned black.  She says that they are thick and painful, especially her second toenail left foot.  She denies any drainage or infection from the second and third toes, left foot.  She denies any trauma or injury to these toenails.  She believes she has a fungus and presents to  the office today for an evaluation and treatment of these toenails.   General Appearance  Alert, conversant and in no acute stress.  Vascular  Dorsalis pedis and posterior tibial  pulses are palpable  bilaterally.  Capillary return is within normal limits  bilaterally. Temperature is within normal limits  bilaterally.  Neurologic  Senn-Weinstein monofilament wire test within normal limits  bilaterally. Muscle power within normal limits bilaterally.  Nails Thick disfigured discolored nails with subungual debris  2,3 left foot.. No evidence of bacterial infection or drainage bilaterally.  Orthopedic  No limitations of motion of motion feet .  No crepitus or effusions noted.  No bony pathology or digital deformities noted.  Skin  normotropic skin with no porokeratosis noted bilaterally.  No signs of infections or ulcers noted.    Onychomycosis  Left foot.   IE  Debride thick painful nails 2,3 left foot.  Told this patient that conservative treatment is to debride the nails.  If she is not pleased with the conservative treatment we discussed nail surgery for the permanent removal of her painful toenails.  RTC 10 weeks.   Gardiner Barefoot DPM

## 2017-07-23 ENCOUNTER — Ambulatory Visit: Payer: 59

## 2017-07-23 ENCOUNTER — Ambulatory Visit: Payer: 59 | Admitting: Oncology

## 2017-07-30 ENCOUNTER — Inpatient Hospital Stay: Payer: 59 | Attending: Oncology

## 2017-07-30 DIAGNOSIS — N946 Dysmenorrhea, unspecified: Secondary | ICD-10-CM | POA: Diagnosis not present

## 2017-07-30 DIAGNOSIS — D509 Iron deficiency anemia, unspecified: Secondary | ICD-10-CM

## 2017-07-30 DIAGNOSIS — M1990 Unspecified osteoarthritis, unspecified site: Secondary | ICD-10-CM | POA: Diagnosis not present

## 2017-07-30 DIAGNOSIS — M797 Fibromyalgia: Secondary | ICD-10-CM | POA: Insufficient documentation

## 2017-07-30 DIAGNOSIS — R5381 Other malaise: Secondary | ICD-10-CM | POA: Insufficient documentation

## 2017-07-30 DIAGNOSIS — Z79899 Other long term (current) drug therapy: Secondary | ICD-10-CM | POA: Diagnosis not present

## 2017-07-30 DIAGNOSIS — N920 Excessive and frequent menstruation with regular cycle: Secondary | ICD-10-CM | POA: Diagnosis not present

## 2017-07-30 DIAGNOSIS — I10 Essential (primary) hypertension: Secondary | ICD-10-CM | POA: Insufficient documentation

## 2017-07-30 DIAGNOSIS — F1210 Cannabis abuse, uncomplicated: Secondary | ICD-10-CM | POA: Diagnosis not present

## 2017-07-30 DIAGNOSIS — F319 Bipolar disorder, unspecified: Secondary | ICD-10-CM | POA: Diagnosis not present

## 2017-07-30 DIAGNOSIS — R5383 Other fatigue: Secondary | ICD-10-CM | POA: Insufficient documentation

## 2017-07-30 DIAGNOSIS — K219 Gastro-esophageal reflux disease without esophagitis: Secondary | ICD-10-CM | POA: Insufficient documentation

## 2017-07-30 DIAGNOSIS — D500 Iron deficiency anemia secondary to blood loss (chronic): Secondary | ICD-10-CM | POA: Insufficient documentation

## 2017-07-30 DIAGNOSIS — D473 Essential (hemorrhagic) thrombocythemia: Secondary | ICD-10-CM | POA: Diagnosis not present

## 2017-07-30 DIAGNOSIS — D649 Anemia, unspecified: Secondary | ICD-10-CM

## 2017-07-30 DIAGNOSIS — D75839 Thrombocytosis, unspecified: Secondary | ICD-10-CM

## 2017-07-30 DIAGNOSIS — F17210 Nicotine dependence, cigarettes, uncomplicated: Secondary | ICD-10-CM | POA: Insufficient documentation

## 2017-07-30 LAB — CBC WITH DIFFERENTIAL/PLATELET
BASOS ABS: 0 10*3/uL (ref 0–0.1)
Basophils Relative: 1 %
EOS PCT: 4 %
Eosinophils Absolute: 0.2 10*3/uL (ref 0–0.7)
HCT: 32.1 % — ABNORMAL LOW (ref 35.0–47.0)
Hemoglobin: 10 g/dL — ABNORMAL LOW (ref 12.0–16.0)
LYMPHS PCT: 38 %
Lymphs Abs: 2 10*3/uL (ref 1.0–3.6)
MCH: 19.3 pg — ABNORMAL LOW (ref 26.0–34.0)
MCHC: 31.2 g/dL — ABNORMAL LOW (ref 32.0–36.0)
MCV: 61.9 fL — AB (ref 80.0–100.0)
Monocytes Absolute: 0.4 10*3/uL (ref 0.2–0.9)
Monocytes Relative: 7 %
NEUTROS ABS: 2.7 10*3/uL (ref 1.4–6.5)
Neutrophils Relative %: 50 %
PLATELETS: 413 10*3/uL (ref 150–440)
RBC: 5.19 MIL/uL (ref 3.80–5.20)
RDW: 20.9 % — ABNORMAL HIGH (ref 11.5–14.5)
WBC: 5.3 10*3/uL (ref 3.6–11.0)

## 2017-07-30 LAB — IRON AND TIBC
Iron: 15 ug/dL — ABNORMAL LOW (ref 28–170)
SATURATION RATIOS: 4 % — AB (ref 10.4–31.8)
TIBC: 429 ug/dL (ref 250–450)
UIBC: 414 ug/dL

## 2017-08-01 ENCOUNTER — Other Ambulatory Visit: Payer: Self-pay

## 2017-08-01 ENCOUNTER — Encounter: Payer: Self-pay | Admitting: Oncology

## 2017-08-01 ENCOUNTER — Inpatient Hospital Stay (HOSPITAL_BASED_OUTPATIENT_CLINIC_OR_DEPARTMENT_OTHER): Payer: 59 | Admitting: Oncology

## 2017-08-01 ENCOUNTER — Encounter (INDEPENDENT_AMBULATORY_CARE_PROVIDER_SITE_OTHER): Payer: Self-pay

## 2017-08-01 ENCOUNTER — Inpatient Hospital Stay: Payer: 59

## 2017-08-01 VITALS — BP 126/80 | HR 88 | Resp 20

## 2017-08-01 VITALS — BP 124/82 | HR 86 | Temp 97.6°F | Resp 18 | Wt 241.6 lb

## 2017-08-01 DIAGNOSIS — K219 Gastro-esophageal reflux disease without esophagitis: Secondary | ICD-10-CM

## 2017-08-01 DIAGNOSIS — M1990 Unspecified osteoarthritis, unspecified site: Secondary | ICD-10-CM

## 2017-08-01 DIAGNOSIS — D509 Iron deficiency anemia, unspecified: Secondary | ICD-10-CM

## 2017-08-01 DIAGNOSIS — D75839 Thrombocytosis, unspecified: Secondary | ICD-10-CM

## 2017-08-01 DIAGNOSIS — D473 Essential (hemorrhagic) thrombocythemia: Secondary | ICD-10-CM

## 2017-08-01 DIAGNOSIS — Z79899 Other long term (current) drug therapy: Secondary | ICD-10-CM

## 2017-08-01 DIAGNOSIS — F1210 Cannabis abuse, uncomplicated: Secondary | ICD-10-CM

## 2017-08-01 DIAGNOSIS — N946 Dysmenorrhea, unspecified: Secondary | ICD-10-CM | POA: Diagnosis not present

## 2017-08-01 DIAGNOSIS — D500 Iron deficiency anemia secondary to blood loss (chronic): Secondary | ICD-10-CM | POA: Diagnosis not present

## 2017-08-01 DIAGNOSIS — R5383 Other fatigue: Secondary | ICD-10-CM

## 2017-08-01 DIAGNOSIS — F319 Bipolar disorder, unspecified: Secondary | ICD-10-CM

## 2017-08-01 DIAGNOSIS — M797 Fibromyalgia: Secondary | ICD-10-CM

## 2017-08-01 DIAGNOSIS — N920 Excessive and frequent menstruation with regular cycle: Secondary | ICD-10-CM

## 2017-08-01 DIAGNOSIS — F17210 Nicotine dependence, cigarettes, uncomplicated: Secondary | ICD-10-CM

## 2017-08-01 DIAGNOSIS — I10 Essential (primary) hypertension: Secondary | ICD-10-CM

## 2017-08-01 DIAGNOSIS — R5381 Other malaise: Secondary | ICD-10-CM

## 2017-08-01 MED ORDER — IRON SUCROSE 20 MG/ML IV SOLN
200.0000 mg | Freq: Once | INTRAVENOUS | Status: AC
  Administered 2017-08-01: 200 mg via INTRAVENOUS
  Filled 2017-08-01: qty 10

## 2017-08-01 NOTE — Progress Notes (Signed)
Patient here for follow up. Pt states left leg has been in pain for the past couple of weeks.

## 2017-08-02 NOTE — Progress Notes (Signed)
Hematology/Oncology Consult note [facility] Telephone:([phone] [phone]   Patient Care Team: Remi Haggard, FNP as PCP - General (Family Medicine) Gala Romney Cristopher Estimable, MD as Consulting Physician (Gastroenterology)  REFERRING PROVIDER: Remi Haggard, FNP CHIEF COMPLAINTS/PURPOSE OF CONSULTATION:  Evaluation of anemia.   HISTORY OF PRESENTING ILLNESS:  [name] is a  42 y.o.  female with PMH listed below who was referred to me for evaluation of anemia. Most recent cbc showed hemoglobin 10, MCV 66.4, platelet 447.   History of iron deficiency: Denies. Rectal bleeding: denies Menstrual bleeding/ Vaginal bleeding : heavy menstrual period, uses a whole bag of pads for the first 3 days of period.  Hematemesis or hemoptysis : none  Blood in urine : none  Pica: craves for ice chips.  Last endoscopy: never.  Fatigue: yes.  INTERVAL HISTORY [name] is a 42 y.o. female who has above history reviewed by me today presents for follow up visit for management of iron deficiency anemia Problems and complaints are listed below: During the interval she is supposed to get IV iron infusions.  However patient canceled her appointment. She continued to feel fatigued, and also have heavy menstrual period.  Accompanied by her daughter.   Review of Systems  Constitutional: Positive for malaise/fatigue. Negative for chills, fever and weight loss.  HENT: Negative for congestion, ear discharge, ear pain, nosebleeds, sinus pain and sore throat.   Eyes: Negative for double vision, photophobia, pain, discharge and redness.  Respiratory: Negative for cough, hemoptysis, sputum production, shortness of breath and wheezing.   Cardiovascular: Negative for chest pain, palpitations, orthopnea, claudication and leg swelling.  Gastrointestinal: Negative for abdominal pain, blood in stool, constipation, diarrhea, heartburn, melena, nausea and vomiting.    Genitourinary: Negative for dysuria, flank pain, frequency and hematuria.  Musculoskeletal: Negative for back pain, myalgias and neck pain.  Skin: Negative for itching and rash.  Neurological: Negative for dizziness, tingling, tremors, focal weakness, weakness and headaches.  Endo/Heme/Allergies: Negative for environmental allergies. Does not bruise/bleed easily.  Psychiatric/Behavioral: Negative for depression and hallucinations. The patient is not nervous/anxious.     MEDICAL HISTORY:  Past Medical History:  Diagnosis Date  . Bipolar 1 disorder ([place])   . Fibromyalgia   . GERD (gastroesophageal reflux disease)   . History of degenerative disc disease   . Hypertension   . Manic depression ([place])   . osteoarthritis    osteoarthritis  . Sciatica     SURGICAL HISTORY: Past Surgical History:  Procedure Laterality Date  . ANKLE SURGERY Left    ORIF  . BACK SURGERY     x3  . CHOLECYSTECTOMY  1998   Dr. Anthony Sar  . ERCP  2005   Dr. Laural Golden: s/p sphincerotomy with removal of sludge and debris  . LUMBAR WOUND DEBRIDEMENT N/A 08/16/2015   Procedure: Spinal Cord Stimulator Revision;  Surgeon: Ashok Pall, MD;  Location: [place] [facility] OR[facility];  Service: Neurosurgery;  Laterality: N/A;  . SPINAL CORD STIMULATOR INSERTION N/A 08/16/2015   Procedure: LUMBAR SPINAL CORD STIMULATOR INSERTION;  Surgeon: Ashok Pall, MD;  Location: [place] NEUR[facility] ORS;  Service: Neurosurgery;  Laterality: N/A;  LUMBAR SPINAL CORD STIMULATOR INSERTION  . TUBAL LIGATION    . UMBILICAL HERNIA REPAIR  1995   [facility]    SOCIAL HISTORY: Social History   Socioeconomic History  . Marital status: Married    Spouse name: Not on file  . Number of children: 2  . Years of education:  Not on file  . Highest education level: Not on file  Occupational History  . Occupation: disabled  Social Needs  . Financial resource strain: Not on file  . Food insecurity:    Worry: Not on file    Inability: Not on file  .  Transportation needs:    Medical: Not on file    Non-medical: Not on file  Tobacco Use  . Smoking status: Current Every Day Smoker    Packs/day: 0.50    Years: 20.00    Pack years: 10.00    Types: Cigarettes  . Smokeless tobacco: Never Used  Substance and Sexual Activity  . Alcohol use: No  . Drug use: Yes    Types: Marijuana    Comment: none since 07/2016  . Sexual activity: Yes    Birth control/protection: Surgical  Lifestyle  . Physical activity:    Days per week: Not on file    Minutes per session: Not on file  . Stress: Not on file  Relationships  . Social connections:    Talks on phone: Not on file    Gets together: Not on file    Attends religious service: Not on file    Active member of club or organization: Not on file    Attends meetings of clubs or organizations: Not on file    Relationship status: Not on file  . Intimate partner violence:    Fear of current or ex partner: Not on file    Emotionally abused: Not on file    Physically abused: Not on file    Forced sexual activity: Not on file  Other Topics Concern  . Not on file  Social History Narrative  . Not on file    FAMILY HISTORY: Family History  Problem Relation Age of Onset  . Crohn's disease Maternal Aunt   . Rheum arthritis Mother   . Diabetes Mother   . Clotting disorder Mother   . Colon cancer Neg Hx     ALLERGIES:  is allergic to vicodin [hydrocodone-acetaminophen]; acetaminophen; hydrocil [psyllium]; and percocet [oxycodone-acetaminophen].  MEDICATIONS:  Current Outpatient Medications  Medication Sig Dispense Refill  . ALPRAZolam (XANAX) 1 MG tablet Take 1 mg by mouth 3 (three) times daily as needed for anxiety.    Marland Kitchen amitriptyline (ELAVIL) 150 MG tablet Take 1 tablet by mouth at bedtime.    . Aspirin-Acetaminophen-Caffeine (EXCEDRIN PO) Take 1-2 tablets by mouth 2 (two) times daily as needed (headaches).     . baclofen (LIORESAL) 10 MG tablet Take 10 mg by mouth 2 (two) times daily.      . Buprenorphine HCl-Naloxone HCl (SUBOXONE) 8-2 MG FILM Place 1 Film under the tongue 3 (three) times daily.    Marland Kitchen DEXILANT 60 MG capsule Take 60 mg by mouth daily.    Marland Kitchen gabapentin (NEURONTIN) 600 MG tablet Take 600 mg by mouth 2 (two) times daily.     Marland Kitchen lisinopril-hydrochlorothiazide (PRINZIDE,ZESTORETIC) 20-25 MG tablet Take 1 tablet by mouth daily.    Marland Kitchen umeclidinium-vilanterol (ANORO ELLIPTA) 62.5-25 MCG/INH AEPB Inhale 1 puff into the lungs daily.    . varenicline (CHANTIX) 0.5 MG tablet Take 1 tablet (0.5 mg total) by mouth 2 (two) times daily. 60 tablet 0   No current facility-administered medications for this visit.    Facility-Administered Medications Ordered in Other Visits  Medication Dose Route Frequency Provider Last Rate Last Dose  . ondansetron (ZOFRAN) 4 mg in sodium chloride 0.9 % 50 mL IVPB  4 mg Intravenous Q6H  PRN Ashok Pall, MD         PHYSICAL EXAMINATION: ECOG PERFORMANCE STATUS: 1 - Symptomatic but completely ambulatory Vitals:   08/01/17 1347  BP: 124/82  Pulse: 86  Resp: 18  Temp: 97.6 F (36.4 C)  SpO2: 100%   Filed Weights   08/01/17 1347  Weight: 241 lb 9.6 oz (109.6 kg)    Physical Exam  Constitutional: She is oriented to person, place, and time. She appears well-developed and well-nourished. No distress.  HENT:  Head: Normocephalic and atraumatic.  Right Ear: External ear normal.  Left Ear: External ear normal.  Mouth/Throat: Oropharynx is clear and moist.  Eyes: Pupils are equal, round, and reactive to light. EOM are normal. No scleral icterus.  Pale  Neck: Normal range of motion. Neck supple.  Cardiovascular: Normal rate, regular rhythm and normal heart sounds.  Pulmonary/Chest: Effort normal and breath sounds normal. No respiratory distress. She has no wheezes. She has no rales. She exhibits no tenderness.  Abdominal: Soft. Bowel sounds are normal. She exhibits no distension and no mass. There is no tenderness.  Musculoskeletal: Normal  range of motion. She exhibits no edema or deformity.  Lymphadenopathy:    She has no cervical adenopathy.  Neurological: She is alert and oriented to person, place, and time. No cranial nerve deficit. Coordination normal.  Skin: Skin is warm and dry. No rash noted.  Psychiatric: She has a normal mood and affect. Her behavior is normal. Thought content normal.     LABORATORY DATA:  I have reviewed the data as listed Lab Results  Component Value Date   WBC 5.3 07/30/2017   HGB 10.0 (L) 07/30/2017   HCT 32.1 (L) 07/30/2017   MCV 61.9 (L) 07/30/2017   PLT 413 07/30/2017   Recent Labs    12/19/16 1348 05/10/17 1738  NA 136 137  K 3.4* 3.4*  CL 101 104  CO2 27 22  GLUCOSE 72 123*  BUN 17 8  CREATININE 0.90 0.64  CALCIUM 9.2 8.9  GFRNONAA >60 >60  GFRAA >60 >60  PROT  --  7.1  ALBUMIN  --  3.4*  AST  --  18  ALT  --  14  ALKPHOS  --  72  BILITOT  --  0.4       ASSESSMENT & PLAN:  1. Menorrhalgia   2. Thrombocytosis ([place])   3. Iron deficiency anemia due to chronic blood loss    Lab results were reviewed and discussed with patient and her daughter.  As expected, as she has not received any IV iron, she remains to be quite anemic and severely iron deficient.  Plan IV iron with Venofer 200mg  twice a week for 5 doses. Allergy reactions/infusion reaction including anaphylactic reaction discussed with patient. Other side effects include but not limited to high blood pressure, skin rash, weight gain, leg swelling, etc. Patient voices understanding and willing to proceed. # Advise patient to establish care with GYN for menorrahgia work up and management.  Referred to GYN.  All questions were answered. The patient knows to call the clinic with any problems questions or concerns.  Return of visit: 6 weeks for reassessment.  Total face to face encounter time for this patient visit was 15 min. >50% of the time was  spent in counseling and coordination of care.   Earlie Server, MD,  PhD Hematology Oncology [facility] at [facility] Pager- [phone] 08/02/2017

## 2017-08-12 ENCOUNTER — Inpatient Hospital Stay: Payer: 59

## 2017-08-12 VITALS — BP 124/78 | HR 84 | Temp 97.6°F | Resp 18

## 2017-08-12 DIAGNOSIS — D500 Iron deficiency anemia secondary to blood loss (chronic): Secondary | ICD-10-CM | POA: Diagnosis not present

## 2017-08-12 DIAGNOSIS — D509 Iron deficiency anemia, unspecified: Secondary | ICD-10-CM

## 2017-08-12 MED ORDER — SODIUM CHLORIDE 0.9 % IV SOLN
INTRAVENOUS | Status: DC
  Administered 2017-08-12: 14:00:00 via INTRAVENOUS
  Filled 2017-08-12: qty 1000

## 2017-08-12 MED ORDER — IRON SUCROSE 20 MG/ML IV SOLN
200.0000 mg | Freq: Once | INTRAVENOUS | Status: AC
  Administered 2017-08-12: 200 mg via INTRAVENOUS
  Filled 2017-08-12: qty 10

## 2017-08-15 ENCOUNTER — Inpatient Hospital Stay: Payer: 59

## 2017-08-15 VITALS — BP 108/71 | HR 87 | Temp 97.2°F | Resp 20

## 2017-08-15 DIAGNOSIS — D509 Iron deficiency anemia, unspecified: Secondary | ICD-10-CM

## 2017-08-15 DIAGNOSIS — D500 Iron deficiency anemia secondary to blood loss (chronic): Secondary | ICD-10-CM | POA: Diagnosis not present

## 2017-08-15 MED ORDER — IRON SUCROSE 20 MG/ML IV SOLN
200.0000 mg | Freq: Once | INTRAVENOUS | Status: AC
  Administered 2017-08-15: 200 mg via INTRAVENOUS
  Filled 2017-08-15: qty 10

## 2017-08-15 MED ORDER — SODIUM CHLORIDE 0.9 % IV SOLN
Freq: Once | INTRAVENOUS | Status: AC
  Administered 2017-08-15: 14:00:00 via INTRAVENOUS
  Filled 2017-08-15: qty 1000

## 2017-08-19 ENCOUNTER — Inpatient Hospital Stay: Payer: 59

## 2017-08-19 DIAGNOSIS — D500 Iron deficiency anemia secondary to blood loss (chronic): Secondary | ICD-10-CM | POA: Diagnosis not present

## 2017-08-19 DIAGNOSIS — D509 Iron deficiency anemia, unspecified: Secondary | ICD-10-CM

## 2017-08-19 MED ORDER — IRON SUCROSE 20 MG/ML IV SOLN
200.0000 mg | Freq: Once | INTRAVENOUS | Status: AC
  Administered 2017-08-19: 200 mg via INTRAVENOUS
  Filled 2017-08-19: qty 10

## 2017-08-19 MED ORDER — SODIUM CHLORIDE 0.9 % IV SOLN
INTRAVENOUS | Status: DC
  Administered 2017-08-19: 14:00:00 via INTRAVENOUS
  Filled 2017-08-19: qty 1000

## 2017-08-21 ENCOUNTER — Inpatient Hospital Stay: Payer: 59 | Attending: Oncology

## 2017-08-21 VITALS — BP 110/71 | HR 75 | Temp 98.0°F | Resp 20

## 2017-08-21 DIAGNOSIS — F5089 Other specified eating disorder: Secondary | ICD-10-CM | POA: Insufficient documentation

## 2017-08-21 DIAGNOSIS — R5383 Other fatigue: Secondary | ICD-10-CM | POA: Diagnosis not present

## 2017-08-21 DIAGNOSIS — F17210 Nicotine dependence, cigarettes, uncomplicated: Secondary | ICD-10-CM | POA: Insufficient documentation

## 2017-08-21 DIAGNOSIS — M797 Fibromyalgia: Secondary | ICD-10-CM | POA: Diagnosis not present

## 2017-08-21 DIAGNOSIS — Z7982 Long term (current) use of aspirin: Secondary | ICD-10-CM | POA: Insufficient documentation

## 2017-08-21 DIAGNOSIS — N946 Dysmenorrhea, unspecified: Secondary | ICD-10-CM | POA: Diagnosis not present

## 2017-08-21 DIAGNOSIS — K219 Gastro-esophageal reflux disease without esophagitis: Secondary | ICD-10-CM | POA: Diagnosis not present

## 2017-08-21 DIAGNOSIS — R5381 Other malaise: Secondary | ICD-10-CM | POA: Diagnosis not present

## 2017-08-21 DIAGNOSIS — F1210 Cannabis abuse, uncomplicated: Secondary | ICD-10-CM | POA: Diagnosis not present

## 2017-08-21 DIAGNOSIS — M1990 Unspecified osteoarthritis, unspecified site: Secondary | ICD-10-CM | POA: Insufficient documentation

## 2017-08-21 DIAGNOSIS — D509 Iron deficiency anemia, unspecified: Secondary | ICD-10-CM

## 2017-08-21 DIAGNOSIS — I10 Essential (primary) hypertension: Secondary | ICD-10-CM | POA: Insufficient documentation

## 2017-08-21 DIAGNOSIS — D500 Iron deficiency anemia secondary to blood loss (chronic): Secondary | ICD-10-CM | POA: Insufficient documentation

## 2017-08-21 DIAGNOSIS — Z79899 Other long term (current) drug therapy: Secondary | ICD-10-CM | POA: Diagnosis not present

## 2017-08-21 MED ORDER — IRON SUCROSE 20 MG/ML IV SOLN
200.0000 mg | Freq: Once | INTRAVENOUS | Status: AC
  Administered 2017-08-21: 200 mg via INTRAVENOUS
  Filled 2017-08-21: qty 10

## 2017-08-21 MED ORDER — SODIUM CHLORIDE 0.9 % IV SOLN
Freq: Once | INTRAVENOUS | Status: AC
  Administered 2017-08-21: 14:00:00 via INTRAVENOUS
  Filled 2017-08-21: qty 1000

## 2017-09-09 ENCOUNTER — Inpatient Hospital Stay: Payer: 59

## 2017-09-09 DIAGNOSIS — D500 Iron deficiency anemia secondary to blood loss (chronic): Secondary | ICD-10-CM

## 2017-09-09 DIAGNOSIS — D473 Essential (hemorrhagic) thrombocythemia: Secondary | ICD-10-CM

## 2017-09-09 DIAGNOSIS — N946 Dysmenorrhea, unspecified: Secondary | ICD-10-CM

## 2017-09-09 DIAGNOSIS — D75839 Thrombocytosis, unspecified: Secondary | ICD-10-CM

## 2017-09-09 LAB — CBC WITH DIFFERENTIAL/PLATELET
BASOS ABS: 0.1 10*3/uL (ref 0–0.1)
Basophils Relative: 1 %
Eosinophils Absolute: 0.2 10*3/uL (ref 0–0.7)
Eosinophils Relative: 3 %
HEMATOCRIT: 39.6 % (ref 35.0–47.0)
Hemoglobin: 12.5 g/dL (ref 12.0–16.0)
LYMPHS ABS: 1.9 10*3/uL (ref 1.0–3.6)
LYMPHS PCT: 35 %
MCH: 21.8 pg — AB (ref 26.0–34.0)
MCHC: 31.6 g/dL — ABNORMAL LOW (ref 32.0–36.0)
MCV: 69.1 fL — AB (ref 80.0–100.0)
MONO ABS: 0.3 10*3/uL (ref 0.2–0.9)
MONOS PCT: 6 %
NEUTROS ABS: 3 10*3/uL (ref 1.4–6.5)
Neutrophils Relative %: 55 %
Platelets: 348 10*3/uL (ref 150–440)
RBC: 5.73 MIL/uL — ABNORMAL HIGH (ref 3.80–5.20)
RDW: 28.6 % — AB (ref 11.5–14.5)
WBC: 5.4 10*3/uL (ref 3.6–11.0)

## 2017-09-09 LAB — IRON AND TIBC
Iron: 34 ug/dL (ref 28–170)
Saturation Ratios: 10 % — ABNORMAL LOW (ref 10.4–31.8)
TIBC: 335 ug/dL (ref 250–450)
UIBC: 301 ug/dL

## 2017-09-09 LAB — FERRITIN: Ferritin: 84 ng/mL (ref 11–307)

## 2017-09-12 ENCOUNTER — Inpatient Hospital Stay (HOSPITAL_BASED_OUTPATIENT_CLINIC_OR_DEPARTMENT_OTHER): Payer: 59 | Admitting: Oncology

## 2017-09-12 ENCOUNTER — Encounter: Payer: Self-pay | Admitting: Oncology

## 2017-09-12 ENCOUNTER — Inpatient Hospital Stay: Payer: 59

## 2017-09-12 ENCOUNTER — Other Ambulatory Visit: Payer: Self-pay

## 2017-09-12 VITALS — BP 102/69 | HR 93 | Temp 97.9°F | Resp 18 | Wt 226.4 lb

## 2017-09-12 DIAGNOSIS — F5089 Other specified eating disorder: Secondary | ICD-10-CM

## 2017-09-12 DIAGNOSIS — D500 Iron deficiency anemia secondary to blood loss (chronic): Secondary | ICD-10-CM

## 2017-09-12 DIAGNOSIS — N946 Dysmenorrhea, unspecified: Secondary | ICD-10-CM | POA: Diagnosis not present

## 2017-09-12 DIAGNOSIS — Z79899 Other long term (current) drug therapy: Secondary | ICD-10-CM

## 2017-09-12 DIAGNOSIS — Z7982 Long term (current) use of aspirin: Secondary | ICD-10-CM

## 2017-09-12 DIAGNOSIS — R5381 Other malaise: Secondary | ICD-10-CM

## 2017-09-12 DIAGNOSIS — I10 Essential (primary) hypertension: Secondary | ICD-10-CM

## 2017-09-12 DIAGNOSIS — M797 Fibromyalgia: Secondary | ICD-10-CM

## 2017-09-12 DIAGNOSIS — R5383 Other fatigue: Secondary | ICD-10-CM

## 2017-09-12 DIAGNOSIS — M1990 Unspecified osteoarthritis, unspecified site: Secondary | ICD-10-CM

## 2017-09-12 DIAGNOSIS — K219 Gastro-esophageal reflux disease without esophagitis: Secondary | ICD-10-CM

## 2017-09-12 DIAGNOSIS — F17210 Nicotine dependence, cigarettes, uncomplicated: Secondary | ICD-10-CM

## 2017-09-12 DIAGNOSIS — F1210 Cannabis abuse, uncomplicated: Secondary | ICD-10-CM

## 2017-09-12 NOTE — Progress Notes (Signed)
Hematology/Oncology follow up note [facility] [phone] Fax:(336) [phone]   Patient Care Team: Remi Haggard, FNP as PCP - General (Family Medicine) Gala Romney Cristopher Estimable, MD as Consulting Physician (Gastroenterology)  REFERRING PROVIDER: Remi Haggard, FNP REASON FOR VISIT Follow up for treatment of anemia.   HISTORY OF PRESENTING ILLNESS:  [name] is a  42 y.o.  female with PMH listed below who was referred to me for evaluation of anemia. Most recent cbc showed hemoglobin 10, MCV 66.4, platelet 447.   History of iron deficiency: Denies. Rectal bleeding: denies Menstrual bleeding/ Vaginal bleeding : heavy menstrual period, uses a whole bag of pads for the first 3 days of period.  Hematemesis or hemoptysis : none  Blood in urine : none  Pica: craves for ice chips.  Last endoscopy: never.  Fatigue: yes.  INTERVAL HISTORY [name] is a 42 y.o. female who has above history reviewed by me today present for follow-up for management of iron deficiency anemia. Status post IV Venofer during the interval. Per patient patient has been started on oral contraceptive pills combined with iron. Last menstrual period was about 8 days and was heavy. Fatigue has improved however not completely resolved.  Review of Systems  Constitutional: Positive for malaise/fatigue. Negative for chills, fever and weight loss.  HENT: Negative for congestion, ear discharge, ear pain, nosebleeds, sinus pain and sore throat.   Eyes: Negative for double vision, photophobia, pain, discharge and redness.  Respiratory: Negative for cough, hemoptysis, sputum production, shortness of breath and wheezing.   Cardiovascular: Negative for chest pain, palpitations, orthopnea, claudication and leg swelling.  Gastrointestinal: Negative for abdominal pain, blood in stool, constipation, diarrhea, heartburn, melena, nausea and vomiting.  Genitourinary: Negative for dysuria,  flank pain, frequency and hematuria.  Musculoskeletal: Negative for back pain, myalgias and neck pain.  Skin: Negative for itching and rash.  Neurological: Negative for dizziness, tingling, tremors, focal weakness, weakness and headaches.  Endo/Heme/Allergies: Negative for environmental allergies. Does not bruise/bleed easily.  Psychiatric/Behavioral: Negative for depression and hallucinations. The patient is not nervous/anxious.     MEDICAL HISTORY:  Past Medical History:  Diagnosis Date  . Bipolar 1 disorder ([place])   . Fibromyalgia   . GERD (gastroesophageal reflux disease)   . History of degenerative disc disease   . Hypertension   . Manic depression ([place])   . osteoarthritis    osteoarthritis  . Sciatica     SURGICAL HISTORY: Past Surgical History:  Procedure Laterality Date  . ANKLE SURGERY Left    ORIF  . BACK SURGERY     x3  . CHOLECYSTECTOMY  1998   Dr. Anthony Sar  . ERCP  2005   Dr. Laural Golden: s/p sphincerotomy with removal of sludge and debris  . LUMBAR WOUND DEBRIDEMENT N/A 08/16/2015   Procedure: Spinal Cord Stimulator Revision;  Surgeon: Ashok Pall, MD;  Location: [place] NEURO ORS;  Service: Neurosurgery;  Laterality: N/A;  . SPINAL CORD STIMULATOR INSERTION N/A 08/16/2015   Procedure: LUMBAR SPINAL CORD STIMULATOR INSERTION;  Surgeon: Ashok Pall, MD;  Location: [place] NEURO ORS;  Service: Neurosurgery;  Laterality: N/A;  LUMBAR SPINAL CORD STIMULATOR INSERTION  . TUBAL LIGATION    . UMBILICAL HERNIA REPAIR  1995   [facility]    SOCIAL HISTORY: Social History   Socioeconomic History  . Marital status: Married    Spouse name: Not on file  . Number of children: 2  . Years of education: Not on file  .  Highest education level: Not on file  Occupational History  . Occupation: disabled  Social Needs  . Financial resource strain: Not on file  . Food insecurity:    Worry: Not on file    Inability: Not on file  . Transportation needs:    Medical: Not on file     Non-medical: Not on file  Tobacco Use  . Smoking status: Current Every Day Smoker    Packs/day: 0.50    Years: 20.00    Pack years: 10.00    Types: Cigarettes  . Smokeless tobacco: Never Used  Substance and Sexual Activity  . Alcohol use: No  . Drug use: Yes    Types: Marijuana    Comment: none since 07/2016  . Sexual activity: Yes    Birth control/protection: Surgical  Lifestyle  . Physical activity:    Days per week: Not on file    Minutes per session: Not on file  . Stress: Not on file  Relationships  . Social connections:    Talks on phone: Not on file    Gets together: Not on file    Attends religious service: Not on file    Active member of club or organization: Not on file    Attends meetings of clubs or organizations: Not on file    Relationship status: Not on file  . Intimate partner violence:    Fear of current or ex partner: Not on file    Emotionally abused: Not on file    Physically abused: Not on file    Forced sexual activity: Not on file  Other Topics Concern  . Not on file  Social History Narrative  . Not on file    FAMILY HISTORY: Family History  Problem Relation Age of Onset  . Crohn's disease Maternal Aunt   . Rheum arthritis Mother   . Diabetes Mother   . Clotting disorder Mother   . Colon cancer Neg Hx     ALLERGIES:  is allergic to vicodin [hydrocodone-acetaminophen]; acetaminophen; hydrocil [psyllium]; and percocet [oxycodone-acetaminophen].  MEDICATIONS:  Current Outpatient Medications  Medication Sig Dispense Refill  . ALPRAZolam (XANAX) 1 MG tablet Take 1 mg by mouth 3 (three) times daily as needed for anxiety.    Marland Kitchen amitriptyline (ELAVIL) 150 MG tablet Take 1 tablet by mouth at bedtime.    . Aspirin-Acetaminophen-Caffeine (EXCEDRIN PO) Take 1-2 tablets by mouth 2 (two) times daily as needed (headaches).     . baclofen (LIORESAL) 10 MG tablet Take 10 mg by mouth 2 (two) times daily.     . Buprenorphine HCl-Naloxone HCl (SUBOXONE) 8-2  MG FILM Place 1 Film under the tongue 3 (three) times daily.    Marland Kitchen DEXILANT 60 MG capsule Take 60 mg by mouth daily.    Marland Kitchen gabapentin (NEURONTIN) 600 MG tablet Take 600 mg by mouth 2 (two) times daily.     Marland Kitchen lisinopril-hydrochlorothiazide (PRINZIDE,ZESTORETIC) 20-25 MG tablet Take 1 tablet by mouth daily.    Marland Kitchen umeclidinium-vilanterol (ANORO ELLIPTA) 62.5-25 MCG/INH AEPB Inhale 1 puff into the lungs daily.    . varenicline (CHANTIX) 0.5 MG tablet Take 1 tablet (0.5 mg total) by mouth 2 (two) times daily. (Patient not taking: Reported on 09/12/2017) 60 tablet 0   No current facility-administered medications for this visit.    Facility-Administered Medications Ordered in Other Visits  Medication Dose Route Frequency Provider Last Rate Last Dose  . ondansetron (ZOFRAN) 4 mg in sodium chloride 0.9 % 50 mL IVPB  4 mg Intravenous  Q6H PRN Ashok Pall, MD         PHYSICAL EXAMINATION: ECOG PERFORMANCE STATUS: 1 - Symptomatic but completely ambulatory Vitals:   09/12/17 1412  BP: 102/69  Pulse: 93  Resp: 18  Temp: 97.9 F (36.6 C)   Filed Weights   09/12/17 1412  Weight: 226 lb 6.4 oz (102.7 kg)    Physical Exam  Constitutional: She is oriented to person, place, and time. No distress.  Obese  HENT:  Head: Normocephalic and atraumatic.  Mouth/Throat: Oropharynx is clear and moist.  Eyes: Pupils are equal, round, and reactive to light. EOM are normal. No scleral icterus.  Neck: Normal range of motion. Neck supple.  Cardiovascular: Normal rate, regular rhythm and normal heart sounds.  Pulmonary/Chest: Effort normal and breath sounds normal. No respiratory distress. She has no wheezes.  Abdominal: Soft. Bowel sounds are normal. She exhibits no distension. There is no tenderness.  Musculoskeletal: Normal range of motion. She exhibits no edema or deformity.  Lymphadenopathy:    She has no cervical adenopathy.  Neurological: She is alert and oriented to person, place, and time. No cranial  nerve deficit. Coordination normal.  Skin: Skin is warm and dry. No rash noted. No erythema.  Psychiatric: She has a normal mood and affect. Her behavior is normal. Thought content normal.     LABORATORY DATA:  I have reviewed the data as listed Lab Results  Component Value Date   WBC 5.4 09/09/2017   HGB 12.5 09/09/2017   HCT 39.6 09/09/2017   MCV 69.1 (L) 09/09/2017   PLT 348 09/09/2017   Recent Labs    12/19/16 1348 05/10/17 1738  NA 136 137  K 3.4* 3.4*  CL 101 104  CO2 27 22  GLUCOSE 72 123*  BUN 17 8  CREATININE 0.90 0.64  CALCIUM 9.2 8.9  GFRNONAA >60 >60  GFRAA >60 >60  PROT  --  7.1  ALBUMIN  --  3.4*  AST  --  18  ALT  --  14  ALKPHOS  --  72  BILITOT  --  0.4       ASSESSMENT & PLAN:  1. Iron deficiency anemia due to chronic blood loss   2. Menorrhalgia    #Lab results were reviewed and discussed with patient. Patient's hemoglobin has significantly improved to 12.5. Ferritin level has also improved to 84. Hold additional IV iron infusion for now.  Recommend patient to start taking oral iron supplementation ferrous sulfate 325 twice daily. I discussed with patient that I am not aware about OCP combined with iron supplementation.  We are going to reach out to her primary providers office to begin out what exactly was the name of the new medication that was started. #Menorrhagia advise patient to establish care with GYN for menorrahgia work up and management.   All questions were answered. The patient knows to call the clinic with any problems questions or concerns.  Return of visit: 2 months Total face to face encounter time for this patient visit was 15 min. >50% of the time was  spent in counseling and coordination of care.  Earlie Server, MD, PhD Hematology Oncology [facility] at [facility] Pager- [phone] 09/12/2017

## 2017-09-12 NOTE — Progress Notes (Signed)
Patient here for follow up.  Pts last menstrual clycle was for about 8 days and it was heavy bleeding. Patient states that her Family docotor, cheryl Lavena Bullion put her on birth control pills with iron, unsure of the name.She was incontinent of stools for a couple of days but it has resolved. Pt continues to smoke 15 cigarettes a day.

## 2017-10-07 ENCOUNTER — Emergency Department (HOSPITAL_COMMUNITY): Payer: 59

## 2017-10-07 ENCOUNTER — Encounter (HOSPITAL_COMMUNITY): Payer: Self-pay

## 2017-10-07 ENCOUNTER — Other Ambulatory Visit: Payer: Self-pay

## 2017-10-07 ENCOUNTER — Emergency Department (HOSPITAL_COMMUNITY)
Admission: EM | Admit: 2017-10-07 | Discharge: 2017-10-07 | Disposition: A | Discharge status: Home / Self Care | Payer: 59 | Attending: Emergency Medicine | Admitting: Emergency Medicine

## 2017-10-07 DIAGNOSIS — I10 Essential (primary) hypertension: Secondary | ICD-10-CM | POA: Insufficient documentation

## 2017-10-07 DIAGNOSIS — K529 Noninfective gastroenteritis and colitis, unspecified: Secondary | ICD-10-CM | POA: Insufficient documentation

## 2017-10-07 DIAGNOSIS — F17210 Nicotine dependence, cigarettes, uncomplicated: Secondary | ICD-10-CM | POA: Insufficient documentation

## 2017-10-07 DIAGNOSIS — J988 Other specified respiratory disorders: Secondary | ICD-10-CM | POA: Diagnosis not present

## 2017-10-07 DIAGNOSIS — R197 Diarrhea, unspecified: Secondary | ICD-10-CM | POA: Diagnosis present

## 2017-10-07 DIAGNOSIS — B9789 Other viral agents as the cause of diseases classified elsewhere: Secondary | ICD-10-CM | POA: Diagnosis not present

## 2017-10-07 DIAGNOSIS — Z79899 Other long term (current) drug therapy: Secondary | ICD-10-CM | POA: Diagnosis not present

## 2017-10-07 HISTORY — DX: Anemia, unspecified: D64.9

## 2017-10-07 LAB — COMPREHENSIVE METABOLIC PANEL WITH GFR
ALT: 15 U/L (ref 0–44)
AST: 16 U/L (ref 15–41)
Albumin: 3.7 g/dL (ref 3.5–5.0)
Alkaline Phosphatase: 61 U/L (ref 38–126)
Anion gap: 8 (ref 5–15)
BUN: 9 mg/dL (ref 6–20)
CO2: 22 mmol/L (ref 22–32)
Calcium: 8.7 mg/dL — ABNORMAL LOW (ref 8.9–10.3)
Chloride: 111 mmol/L (ref 98–111)
Creatinine, Ser: 0.7 mg/dL (ref 0.44–1.00)
GFR calc Af Amer: >60 mL/min
GFR calc non Af Amer: >60 mL/min
Glucose, Bld: 118 mg/dL — ABNORMAL HIGH (ref 70–99)
Potassium: 3.4 mmol/L — ABNORMAL LOW (ref 3.5–5.1)
Sodium: 141 mmol/L (ref 135–145)
Total Bilirubin: 0.7 mg/dL (ref 0.3–1.2)
Total Protein: 7.3 g/dL (ref 6.5–8.1)

## 2017-10-07 LAB — CBC WITH DIFFERENTIAL/PLATELET
Basophils Absolute: 0 K/uL (ref 0.0–0.1)
Basophils Relative: 0 %
Eosinophils Absolute: 0 K/uL (ref 0.0–0.7)
Eosinophils Relative: 0 %
HCT: 45.9 % (ref 36.0–46.0)
Hemoglobin: 14.6 g/dL (ref 12.0–15.0)
Lymphocytes Relative: 15 %
Lymphs Abs: 1.2 K/uL (ref 0.7–4.0)
MCH: 23.3 pg — ABNORMAL LOW (ref 26.0–34.0)
MCHC: 31.8 g/dL (ref 30.0–36.0)
MCV: 73.2 fL — ABNORMAL LOW (ref 78.0–100.0)
Monocytes Absolute: 0.5 K/uL (ref 0.1–1.0)
Monocytes Relative: 6 %
Neutro Abs: 6.6 K/uL (ref 1.7–7.7)
Neutrophils Relative %: 79 %
Platelets: 362 K/uL (ref 150–400)
RBC: 6.27 MIL/uL — ABNORMAL HIGH (ref 3.87–5.11)
RDW: 24.2 % — ABNORMAL HIGH (ref 11.5–15.5)
WBC: 8.3 K/uL (ref 4.0–10.5)

## 2017-10-07 LAB — LIPASE, BLOOD: Lipase: 24 U/L (ref 11–51)

## 2017-10-07 MED ORDER — LORAZEPAM 2 MG/ML IJ SOLN
1.0000 mg | Freq: Once | INTRAMUSCULAR | Status: AC
  Administered 2017-10-07: 1 mg via INTRAVENOUS
  Filled 2017-10-07: qty 1

## 2017-10-07 MED ORDER — SODIUM CHLORIDE 0.9 % IV SOLN
INTRAVENOUS | Status: DC
  Administered 2017-10-07: 14:00:00 via INTRAVENOUS

## 2017-10-07 MED ORDER — LOPERAMIDE HCL 2 MG PO TABS: 2.0000 mg | ORAL_TABLET | Freq: Four times a day (QID) | ORAL | 0 refills | Status: DC | PRN

## 2017-10-07 MED ORDER — ONDANSETRON HCL 4 MG/2ML IJ SOLN
4.0000 mg | Freq: Once | INTRAMUSCULAR | Status: AC
  Administered 2017-10-07: 4 mg via INTRAVENOUS
  Filled 2017-10-07: qty 2

## 2017-10-07 MED ORDER — SODIUM CHLORIDE 0.9 % IV BOLUS
1000.0000 mL | Freq: Once | INTRAVENOUS | Status: AC
  Administered 2017-10-07: 1000 mL via INTRAVENOUS

## 2017-10-07 MED ORDER — BUPRENORPHINE HCL-NALOXONE HCL 8-2 MG SL SUBL
1.0000 | SUBLINGUAL_TABLET | Freq: Every day | SUBLINGUAL | Status: DC
  Administered 2017-10-07: 1 via SUBLINGUAL
  Filled 2017-10-07: qty 1

## 2017-10-07 MED ORDER — ONDANSETRON 4 MG PO TBDP: 4.0000 mg | ORAL_TABLET | Freq: Three times a day (TID) | ORAL | 1 refills | Status: DC | PRN

## 2017-10-07 MED ORDER — DM-GUAIFENESIN ER 30-600 MG PO TB12: 1.0000 | ORAL_TABLET | Freq: Two times a day (BID) | ORAL | 1 refills | Status: DC

## 2017-10-07 NOTE — ED Triage Notes (Signed)
Pt started with cough yesterday, then progressed to abd pain  With N, V and D. Pt reports vomiting x 20 clear liquids and multiple loose stools

## 2017-10-07 NOTE — Discharge Instructions (Addendum)
Take the Zofran for the nausea and vomiting.  Take the Imodium for the diarrhea.  Take the Mucinex DM for the productive cough.  Make an appointment to follow-up with your doctor.  Return for any new or worse symptoms.  Today's chest x-ray showed no evidence of pneumonia.  And x-rays of the abdomen showed no evidence of a bowel obstruction.

## 2017-10-07 NOTE — ED Provider Notes (Addendum)
[facility] EMERGENCY DEPARTMENT Provider Note   CSN: [id number] Arrival date & time: 10/07/17  1253     History   Chief Complaint Chief Complaint  Patient presents with  . Abdominal Pain    HPI [name] is a 42 y.o. female.  Patient was feeling well until yesterday when she started with a productive cough.  Green sputum.  And then overnight she developed a headache body aches and had vomiting and diarrhea.  No blood in either.  20 episodes of vomiting and 7-8 episodes of diarrhea.  Because of this patient has not been able to keep her Suboxone down.  Patient's past medical history is significant for bipolar disease manic depression fibromyalgia hypertension.  Patient is on Neurontin Suboxone and Xanax Elavil Dexilant lisinopril hydrochlorothiazide and Chantix none of these medications have recently been changed.     Past Medical History:  Diagnosis Date  . Anemia   . Bipolar 1 disorder ([place])   . Fibromyalgia   . GERD (gastroesophageal reflux disease)   . History of degenerative disc disease   . Hypertension   . Manic depression ([place])   . osteoarthritis    osteoarthritis  . Sciatica     Patient Active Problem List   Diagnosis Date Noted  . Iron deficiency anemia 07/02/2017  . GERD (gastroesophageal reflux disease) 11/13/2016  . Nausea with vomiting 11/13/2016  . Rectal bleeding 11/13/2016  . Fecal incontinence 11/13/2016  . Early satiety 11/13/2016  . Post laminectomy syndrome 08/16/2015    Past Surgical History:  Procedure Laterality Date  . ANKLE SURGERY Left    ORIF  . BACK SURGERY     x3  . CHOLECYSTECTOMY  1998   Dr. Anthony Sar  . ERCP  2005   Dr. Laural Golden: s/p sphincerotomy with removal of sludge and debris  . LUMBAR WOUND DEBRIDEMENT N/A 08/16/2015   Procedure: Spinal Cord Stimulator Revision;  Surgeon: Ashok Pall, MD;  Location: [place] [facility]UR[facility] OR[facility];  Service: Neurosurgery;  Laterality: N/A;  . SPINAL CORD STIMULATOR INSERTION N/A 08/16/2015   Procedure: LUMBAR SPINAL CORD STIMULATOR INSERTION;  Surgeon: Ashok Pall, MD;  Location: [place] [facility]UR[facility] OR[facility];  Service: Neurosurgery;  Laterality: N/A;  LUMBAR SPINAL CORD STIMULATOR INSERTION  . TUBAL LIGATION    . [place]     OB History   None      Home Medications    Prior to Admission medications   Medication Sig Start Date End Date Taking? Authorizing Provider  ALPRAZolam Duanne Moron) 1 MG tablet Take 1 mg by mouth 3 (three) times daily as needed for anxiety.   Yes [provider]  amitriptyline (ELAVIL) 150 MG tablet Take 1 tablet by mouth at bedtime. 10/21/16  Yes [provider]  Aspirin-Acetaminophen-Caffeine (EXCEDRIN PO) Take 1-2 tablets by mouth 2 (two) times daily as needed (headaches).    Yes [provider]  baclofen (LIORESAL) 10 MG tablet Take 10 mg by mouth 3 (three) times daily.  10/21/16  Yes [provider]  Buprenorphine HCl-Naloxone HCl (SUBOXONE) 8-2 MG FILM Place 1 Film under the tongue 3 (three) times daily.   Yes [provider]  DEXILANT 60 MG capsule Take 60 mg by mouth daily. 07/04/15  Yes [provider]  gabapentin (NEURONTIN) 600 MG tablet Take 600 mg by mouth 2 (two) times daily.  04/27/17  Yes [provider]  lisinopril-hydrochlorothiazide (PRINZIDE,ZESTORETIC) 20-25 MG tablet Take 1 tablet by mouth daily. 09/24/16  Yes [provider]  umeclidinium-vilanterol (ANORO ELLIPTA) 62.5-25 MCG/INH AEPB Inhale 1 puff into the lungs daily.   Yes [provider]  varenicline (CHANTIX) 0.5 MG tablet Take 1 tablet (0.5 mg total) by mouth 2 (two) times daily. Patient not taking: Reported on 09/12/2017 05/10/17   Mesner, Corene Cornea, MD    Family History Family History  Problem Relation Age of Onset  . Crohn's disease Maternal Aunt   . Rheum arthritis Mother   . Diabetes Mother   . Clotting disorder Mother   . Colon cancer Neg Hx     Social History Social  History   Tobacco Use  . Smoking status: Current Every Day Smoker    Packs/day: 1.00    Years: 20.00    Pack years: 20.00    Types: Cigarettes  . Smokeless tobacco: Never Used  Substance Use Topics  . Alcohol use: No  . Drug use: Yes    Types: Marijuana    Comment: none since 07/2016     Allergies   Vicodin [hydrocodone-acetaminophen]; Acetaminophen; Hydrocil [psyllium]; and Percocet [oxycodone-acetaminophen]   Review of Systems Review of Systems  Constitutional: Positive for chills and fatigue. Negative for fever.  HENT: Positive for congestion.   Eyes: Negative for redness.  Respiratory: Positive for cough. Negative for shortness of breath.   Cardiovascular: Negative for chest pain.  Gastrointestinal: Positive for diarrhea, nausea and vomiting. Negative for blood in stool.  Genitourinary: Negative for dysuria.  Musculoskeletal: Positive for myalgias. Negative for back pain.  Skin: Negative for rash.  Neurological: Positive for headaches. Negative for syncope.  Hematological: Does not bruise/bleed easily.  Psychiatric/Behavioral: Negative for confusion.     Physical Exam Updated Vital Signs BP (!) 151/80   Pulse 83   Resp 18   Wt 106.6 kg   LMP 09/30/2017   SpO2 99%   BMI 45.90 kg/m   Physical Exam  Constitutional: She is oriented to person, place, and time. She appears well-developed and well-nourished. No distress.  HENT:  Head: Normocephalic and atraumatic.  Mouth/Throat: No oropharyngeal exudate.  Mucous membranes slightly dry.  Eyes: Pupils are equal, round, and reactive to light. Conjunctivae and EOM are normal.  Neck: Neck supple.  Cardiovascular: Normal rate, regular rhythm and normal heart sounds.  Pulmonary/Chest: Effort normal and breath sounds normal. No respiratory distress.  Abdominal: Soft. Bowel sounds are normal. She exhibits no distension and no mass. There is no tenderness.  Musculoskeletal: Normal range of motion. She exhibits no edema.    Neurological: She is alert and oriented to person, place, and time. No cranial nerve deficit or sensory deficit. She exhibits normal muscle tone. Coordination normal.  Skin: Skin is warm. Capillary refill takes less than 2 seconds.  Nursing note and vitals reviewed.    ED Treatments / Results  Labs (all labs ordered are listed, but only abnormal results are displayed) Labs Reviewed  CBC WITH DIFFERENTIAL/PLATELET - Abnormal; Notable for the following components:      Result Value   RBC 6.27 (*)    MCV 73.2 (*)    MCH 23.3 (*)    RDW 24.2 (*)    All other components within normal limits  COMPREHENSIVE METABOLIC PANEL  LIPASE, BLOOD    EKG None  Radiology Dg Abd Acute W/chest  Result Date: 10/07/2017 CLINICAL DATA:  Productive cough and abdominal pain. EXAM: DG ABDOMEN ACUTE W/ 1V CHEST COMPARISON:  Chest radiograph 05/10/2017 FINDINGS: There is shallow lung inflation. Lungs are clear. No pneumothorax or pleural effusion. Normal  cardiomediastinal contours. A spinal stimulator device overlies the midthoracic spine. There is no free intraperitoneal air. The bowel gas pattern is normal. No abnormal calcifications. IMPRESSION: 1. Clear lungs. 2. Normal bowel gas pattern. Electronically Signed   By: Ulyses Jarred M.D.   On: 10/07/2017 15:49    Procedures Procedures (including critical care time)  Medications Ordered in ED Medications  0.9 %  sodium chloride infusion ( Intravenous Rate/Dose Verify 10/07/17 1600)  buprenorphine-naloxone (SUBOXONE) 8-2 mg per SL tablet 1 tablet (1 tablet Sublingual Given 10/07/17 1430)  ondansetron (ZOFRAN) injection 4 mg (4 mg Intravenous Given 10/07/17 1415)  sodium chloride 0.9 % bolus 1,000 mL (0 mLs Intravenous Stopped 10/07/17 1601)  LORazepam (ATIVAN) injection 1 mg (1 mg Intravenous Given 10/07/17 1418)     Initial Impression / Assessment and Plan / ED Course  I have reviewed the triage vital signs and the nursing notes.  Pertinent labs &  imaging results that were available during my care of the patient were reviewed by me and considered in my medical decision making (see chart for details).     Patient's acute abdominal series without any evidence of small bowel obstruction.  Chest x-ray also negative for pneumonia.  Patient given a dose of her Suboxone here.  As well as antinausea medicine Zofran.  Patient also given IV fluids.  Feel that this is probably an upper respiratory viral illness with a gastroenteritis component.  Vomiting and diarrhea possibly could be related to her inability to take her Suboxone.  Patient's white blood cell count without a significant leukocytosis.  No significant anemia.  Metabolic panel still pending.  Includes liver functions and a lipase.  If without any significant abnormalities would discharge home on Imodium and Zofran.  Follow back up with her regular doctor.   Overall feeling much better.  We will also give her Mucinex DM to go along with the Imodium and Zofran.  Final Clinical Impressions(s) / ED Diagnoses   Final diagnoses:  Viral respiratory illness  Gastroenteritis    ED Discharge Orders    None       Fredia Sorrow, MD 10/07/17 1620    Fredia Sorrow, MD 10/07/17 [phone]

## 2017-11-07 ENCOUNTER — Other Ambulatory Visit: Payer: Self-pay

## 2017-11-10 ENCOUNTER — Encounter: Payer: Self-pay | Admitting: Certified Nurse Midwife

## 2017-11-10 ENCOUNTER — Ambulatory Visit (INDEPENDENT_AMBULATORY_CARE_PROVIDER_SITE_OTHER): Payer: 59 | Admitting: Certified Nurse Midwife

## 2017-11-10 VITALS — BP 124/77 | HR 100 | Ht 60.0 in | Wt 235.0 lb

## 2017-11-10 DIAGNOSIS — N939 Abnormal uterine and vaginal bleeding, unspecified: Secondary | ICD-10-CM

## 2017-11-10 NOTE — Progress Notes (Signed)
GYN ENCOUNTER NOTE  Subjective:       [name] is a 42 y.o. No obstetric history on file. female is here for gynecologic evaluation of the following issues:  1. Abnormal uterine bleeding.  She state she has been having heavy periods. They last for 5-6 days with passing of dime size clots. She has to change her pad every 1-1 1/2 hrs. She has significant cramps that cause her to stay in bed. She uses Excedrin and sometime Midol that helps with the pain. Her PCP put her on BC pills with iron but the bleeding has increased with the pill. She was seen by hematology due to anemia and was recommended that see GYN  for consult to discuss other options.   Gynecologic History Patient's last menstrual period was 10/29/2017 (exact date). Contraception: OCP (estrogen/progesterone) and tubal ligation   Obstetric History OB History  No data available    Past Medical History:  Diagnosis Date  . Anemia   . Bipolar 1 disorder ([place])   . Fibromyalgia   . GERD (gastroesophageal reflux disease)   . History of degenerative disc disease   . Hypertension   . Manic depression ([place])   . osteoarthritis    osteoarthritis  . Sciatica     Past Surgical History:  Procedure Laterality Date  . ANKLE SURGERY Left    ORIF  . BACK SURGERY     x3  . CHOLECYSTECTOMY  1998   Dr. Anthony Sar  . ERCP  2005   Dr. Laural Golden: s/p sphincerotomy with removal of sludge and debris  . LUMBAR WOUND DEBRIDEMENT N/A 08/16/2015   Procedure: Spinal Cord Stimulator Revision;  Surgeon: Ashok Pall, MD;  Location: [place] [facility];  Service: Neurosurgery;  Laterality: N/A;  . SPINAL CORD STIMULATOR INSERTION N/A 08/16/2015   Procedure: LUMBAR SPINAL CORD STIMULATOR INSERTION;  Surgeon: Ashok Pall, MD;  Location: [place] NE[facility] ORS;  Service: Neurosurgery;  Laterality: N/A;  LUMBAR SPINAL CORD STIMULATOR INSERTION  . TUBAL LIGATION    . UMBILICAL HERNIA REPAIR  1995   [facility]    Current Outpatient Medications on File Prior to  Visit  Medication Sig Dispense Refill  . ALPRAZolam (XANAX) 1 MG tablet Take 1 mg by mouth 3 (three) times daily as needed for anxiety.    Marland Kitchen amitriptyline (ELAVIL) 150 MG tablet Take 1 tablet by mouth at bedtime.    . Aspirin-Acetaminophen-Caffeine (EXCEDRIN PO) Take 1-2 tablets by mouth 2 (two) times daily as needed (headaches).     . baclofen (LIORESAL) 10 MG tablet Take 10 mg by mouth 3 (three) times daily.     . Buprenorphine HCl-Naloxone HCl (SUBOXONE) 8-2 MG FILM Place 1 Film under the tongue 3 (three) times daily.    Marland Kitchen DEXILANT 60 MG capsule Take 60 mg by mouth daily.    Marland Kitchen gabapentin (NEURONTIN) 600 MG tablet Take 600 mg by mouth 2 (two) times daily.     Marland Kitchen lisinopril-hydrochlorothiazide (PRINZIDE,ZESTORETIC) 20-25 MG tablet Take 1 tablet by mouth daily.    Marland Kitchen umeclidinium-vilanterol (ANORO ELLIPTA) 62.5-25 MCG/INH AEPB Inhale 1 puff into the lungs daily.    . varenicline (CHANTIX) 0.5 MG tablet Take 1 tablet (0.5 mg total) by mouth 2 (two) times daily. 60 tablet 0   Current Facility-Administered Medications on File Prior to Visit  Medication Dose Route Frequency Provider Last Rate Last Dose  . ondansetron (ZOFRAN) 4 mg in sodium chloride 0.9 % 50 mL IVPB  4 mg Intravenous Q6H PRN Ashok Pall,  MD        Allergies  Allergen Reactions  . Vicodin [Hydrocodone-Acetaminophen] Nausea And Vomiting  . Acetaminophen Nausea Only  . Hydrocil [Psyllium] Nausea Only  . Percocet [Oxycodone-Acetaminophen] Nausea Only    Social History   Socioeconomic History  . Marital status: Married    Spouse name: Not on file  . Number of children: 2  . Years of education: Not on file  . Highest education level: Not on file  Occupational History  . Occupation: disabled  Social Needs  . Financial resource strain: Not on file  . Food insecurity:    Worry: Not on file    Inability: Not on file  . Transportation needs:    Medical: Not on file    Non-medical: Not on file  Tobacco Use  . Smoking  status: Current Every Day Smoker    Packs/day: 1.00    Years: 20.00    Pack years: 20.00    Types: Cigarettes  . Smokeless tobacco: Never Used  Substance and Sexual Activity  . Alcohol use: No  . Drug use: Yes    Types: Marijuana    Comment: none since 07/2016  . Sexual activity: Yes    Birth control/protection: Surgical  Lifestyle  . Physical activity:    Days per week: Not on file    Minutes per session: Not on file  . Stress: Not on file  Relationships  . Social connections:    Talks on phone: Not on file    Gets together: Not on file    Attends religious service: Not on file    Active member of club or organization: Not on file    Attends meetings of clubs or organizations: Not on file    Relationship status: Not on file  . Intimate partner violence:    Fear of current or ex partner: Not on file    Emotionally abused: Not on file    Physically abused: Not on file    Forced sexual activity: Not on file  Other Topics Concern  . Not on file  Social History Narrative  . Not on file    Family History  Problem Relation Age of Onset  . Crohn's disease Maternal Aunt   . Rheum arthritis Mother   . Diabetes Mother   . Clotting disorder Mother   . Colon cancer Neg Hx     The following portions of the patient's history were reviewed and updated as appropriate: allergies, current medications, past family history, past medical history, past social history, past surgical history and problem list.  Review of Systems Review of Systems - Negative except as mentioned in HPI Review of Systems - General ROS: negative for - chills, fatigue, fever, hot flashes, malaise or night sweats Hematological and Lymphatic ROS: negative for - bleeding problems or swollen lymph nodes Gastrointestinal ROS: negative for - abdominal pain, blood in stools, change in bowel habits and nausea/vomiting Musculoskeletal ROS: negative for - joint pain, muscle pain or muscular weakness Genito-Urinary ROS:  negative for -  dysmenorrhea, dyspareunia, dysuria, genital discharge, genital ulcers, hematuria, incontinence, irregular, nocturia or pelvic pain.change in menstrual cycle, Positive: change in menstrual cycle, heavy menses  Objective:   BP 124/77   Pulse 100   Ht 5' (1.524 m)   Wt 235 lb (106.6 kg)   LMP 10/29/2017 (Exact Date)   BMI 45.90 kg/m  CONSTITUTIONAL: Well-developed, well-nourished female in no acute distress.  HENT:  Normocephalic, atraumatic.  NECK: Normal range of motion, supple,  no masses.  Normal thyroid.  SKIN: Skin is warm and dry. No rash noted. Not diaphoretic. No erythema. No pallor. [place]: Alert and oriented to person, place, and time.  PSYCHIATRIC: Normal mood and affect. Normal behavior. Normal judgment and thought content. CARDIOVASCULAR:Not Examined RESPIRATORY: Not Examined BREASTS: Not Examined ABDOMEN: Soft, non distended; Non tender.  No Organomegaly. PELVIC:Deffered  MUSCULOSKELETAL: Normal range of motion. No tenderness.  No cyanosis, clubbing, or edema.   Assessment:   Abnormal uterine bleeding  Menorrhagia    Plan:   Discussed hormonal options to control bleeding. Given her history smoking and hypertension, estrogen is not recommended. Discussed options of IUD and ablation with pt. Reviewed IUD benefits and risks. Discussed IUD procedure for placement. Pamphlet given. Pt state she is not interested in having any other children , she has her tubes tied. Discussed ablation procedure and how it stops bleeding. Information given. Pt interested in ablation. Pt instructed to make an appointment with MD( Cherry/Evans) for consultation. She verbalizes and agrees to plan of care.   I attest more than 50% of visit spent reviewing pt history, Discussing IUD risk/benefits, and procedure for placement. Reviewing ablation. Face to face time 10 min.   Philip Aspen, CNM  [facility]

## 2017-11-10 NOTE — Patient Instructions (Signed)

## 2017-11-11 ENCOUNTER — Other Ambulatory Visit: Payer: Self-pay

## 2017-11-11 ENCOUNTER — Inpatient Hospital Stay: Payer: 59

## 2017-11-11 DIAGNOSIS — D500 Iron deficiency anemia secondary to blood loss (chronic): Secondary | ICD-10-CM

## 2017-11-12 ENCOUNTER — Other Ambulatory Visit: Payer: 59

## 2017-11-13 ENCOUNTER — Inpatient Hospital Stay: Payer: 59

## 2017-11-13 ENCOUNTER — Inpatient Hospital Stay: Payer: 59 | Admitting: Oncology

## 2017-11-19 ENCOUNTER — Other Ambulatory Visit (HOSPITAL_COMMUNITY)
Admission: RE | Admit: 2017-11-19 | Discharge: 2017-11-19 | Disposition: A | Discharge status: Home / Self Care | Payer: 59 | Source: Ambulatory Visit | Attending: Obstetrics and Gynecology | Admitting: Obstetrics and Gynecology

## 2017-11-19 ENCOUNTER — Ambulatory Visit (INDEPENDENT_AMBULATORY_CARE_PROVIDER_SITE_OTHER): Payer: 59 | Admitting: Obstetrics and Gynecology

## 2017-11-19 ENCOUNTER — Encounter: Payer: Self-pay | Admitting: Obstetrics and Gynecology

## 2017-11-19 VITALS — BP 127/88 | HR 93 | Ht 60.0 in | Wt 234.1 lb

## 2017-11-19 DIAGNOSIS — N921 Excessive and frequent menstruation with irregular cycle: Secondary | ICD-10-CM | POA: Diagnosis present

## 2017-11-19 DIAGNOSIS — Z124 Encounter for screening for malignant neoplasm of cervix: Secondary | ICD-10-CM | POA: Insufficient documentation

## 2017-11-19 DIAGNOSIS — R7989 Other specified abnormal findings of blood chemistry: Secondary | ICD-10-CM

## 2017-11-19 DIAGNOSIS — N920 Excessive and frequent menstruation with regular cycle: Secondary | ICD-10-CM

## 2017-11-19 DIAGNOSIS — Z1231 Encounter for screening mammogram for malignant neoplasm of breast: Secondary | ICD-10-CM

## 2017-11-19 DIAGNOSIS — Z6841 Body Mass Index (BMI) 40.0 and over, adult: Secondary | ICD-10-CM

## 2017-11-19 DIAGNOSIS — I10 Essential (primary) hypertension: Secondary | ICD-10-CM | POA: Diagnosis not present

## 2017-11-19 MED ORDER — NORETHINDRONE ACETATE 5 MG PO TABS: 5.0000 mg | ORAL_TABLET | Freq: Every day | ORAL | 2 refills | Status: DC

## 2017-11-19 NOTE — Progress Notes (Signed)
GYNECOLOGY PROGRESS NOTE  Subjective:    Patient ID: [name], female    DOB: [birth year], 42 y.o.   MRN: [id number]  HPI  Patient is a married  42 y.o. G2P2002 female who presents for consultation for possible surgical management of abnormal uterine bleeding. Was referred by Philip Aspen, CNM. Patient reports that her periods are very heavy, lasting 5-6 days with passage of dime-sized clots (although she reports that only the first 3-4 days are the heaviest).  She is having to change her pad ever 1-1.5 hrs.  Cycles are painful with moderate cramping (usually relieved with Excedrin or Midol).  She has been seen by Hematology for anemia and currently receives iron transfusions.  She states that her PCP placed her on OCPs ~ 4 months ago to help with the bleeding, but has not noticed any improvement.  Reports that her most recent cycle started on 10/9 and is still ongoing.    Gynecology History:  Contraception: tubal ligation Last pap smear: patient cannot recall last pap smear.  In Epic, last documented pap smear was 2005.Denies h/o abnormal pap smears Last mammogram: Patient has never had one  Past Medical History:  Diagnosis Date  . Anemia   . Bipolar 1 disorder ([place])   . Fibromyalgia   . GERD (gastroesophageal reflux disease)   . History of degenerative disc disease   . Hypertension   . Manic depression ([place])   . osteoarthritis    osteoarthritis  . Sciatica     Family History  Problem Relation Age of Onset  . Crohn's disease Maternal Aunt   . Rheum arthritis Mother   . Diabetes Mother   . Clotting disorder Mother   . Colon cancer Neg Hx     Past Surgical History:  Procedure Laterality Date  . ANKLE SURGERY Left    ORIF  . BACK SURGERY     x3  . CHOLECYSTECTOMY  1998   Dr. Anthony Sar  . ERCP  2005   Dr. Laural Golden: s/p sphincerotomy with removal of sludge and debris  . LUMBAR WOUND DEBRIDEMENT N/A 08/16/2015   Procedure: Spinal Cord Stimulator Revision;  Surgeon:  Ashok Pall, MD;  Location: [facility];  Service: Neurosurgery;  Laterality: N/A;  . SPINAL CORD STIMULATOR INSERTION N/A 08/16/2015   Procedure: LUMBAR SPINAL CORD STIMULATOR INSERTION;  Surgeon: Ashok Pall, MD;  Location: [facility];  Service: Neurosurgery;  Laterality: N/A;  LUMBAR SPINAL CORD STIMULATOR INSERTION  . TUBAL LIGATION    . [place]    Social History   Socioeconomic History  . Marital status: Married    Spouse name: Not on file  . Number of children: 2  . Years of education: Not on file  . Highest education level: Not on file  Occupational History  . Occupation: disabled  Social Needs  . Financial resource strain: Not on file  . Food insecurity:    Worry: Not on file    Inability: Not on file  . Transportation needs:    Medical: Not on file    Non-medical: Not on file  Tobacco Use  . Smoking status: Current Every Day Smoker    Packs/day: 1.00    Years: 20.00    Pack years: 20.00    Types: Cigarettes  . Smokeless tobacco: Never Used  Substance and Sexual Activity  . Alcohol use: No  . Drug use: Yes    Types: Marijuana  Comment: none since 07/2016  . Sexual activity: Yes    Birth control/protection: Surgical  Lifestyle  . Physical activity:    Days per week: Not on file    Minutes per session: Not on file  . Stress: Not on file  Relationships  . Social connections:    Talks on phone: Not on file    Gets together: Not on file    Attends religious service: Not on file    Active member of club or organization: Not on file    Attends meetings of clubs or organizations: Not on file    Relationship status: Not on file  . Intimate partner violence:    Fear of current or ex partner: Not on file    Emotionally abused: Not on file    Physically abused: Not on file    Forced sexual activity: Not on file  Other Topics Concern  . Not on file  Social History Narrative  . Not on file    Current Outpatient  Medications on File Prior to Visit  Medication Sig Dispense Refill  . ALPRAZolam (XANAX) 1 MG tablet Take 1 mg by mouth 3 (three) times daily as needed for anxiety.    . Aspirin-Acetaminophen-Caffeine (EXCEDRIN PO) Take 1-2 tablets by mouth 2 (two) times daily as needed (headaches).     . Buprenorphine HCl-Naloxone HCl (SUBOXONE) 8-2 MG FILM Place 1 Film under the tongue 3 (three) times daily.    Marland Kitchen DEXILANT 60 MG capsule Take 60 mg by mouth daily.    Marland Kitchen gabapentin (NEURONTIN) 600 MG tablet Take 600 mg by mouth 2 (two) times daily.     Marland Kitchen lisinopril-hydrochlorothiazide (PRINZIDE,ZESTORETIC) 20-25 MG tablet Take 1 tablet by mouth daily.    Marland Kitchen umeclidinium-vilanterol (ANORO ELLIPTA) 62.5-25 MCG/INH AEPB Inhale 1 puff into the lungs daily.    . varenicline (CHANTIX) 0.5 MG tablet Take 1 tablet (0.5 mg total) by mouth 2 (two) times daily. 60 tablet 0  . amitriptyline (ELAVIL) 150 MG tablet Take 1 tablet by mouth at bedtime.     Current Facility-Administered Medications on File Prior to Visit  Medication Dose Route Frequency Provider Last Rate Last Dose  . ondansetron (ZOFRAN) 4 mg in sodium chloride 0.9 % 50 mL IVPB  4 mg Intravenous Q6H PRN Ashok Pall, MD        Allergies  Allergen Reactions  . Vicodin [Hydrocodone-Acetaminophen] Nausea And Vomiting  . Acetaminophen Nausea Only  . Hydrocil [Psyllium] Nausea Only  . Percocet [Oxycodone-Acetaminophen] Nausea Only     Review of Systems Pertinent items noted in HPI and remainder of comprehensive ROS otherwise negative.   Objective:   Blood pressure 127/88, pulse 93, height 5' (1.524 m), weight 234 lb 1.6 oz (106.2 kg), last menstrual period 10/29/2017. Body mass index is 45.72 kg/m.  General appearance: alert, no distress and morbidly obese Abdomen: soft, non-tender; bowel sounds normal; no masses,  no organomegaly Pelvic: external genitalia normal, rectovaginal septum normal.  Vagina with small amount of dark red blood in vaginal vault.   Cervix normal appearing, no lesions and no motion tenderness.  Uterus mobile, nontender, slightly enlarged (~ 12-14 week size) but difficult to assess due to body habitus.  Adnexae non-palpable, nontender bilaterally.  Extremities: extremities normal, atraumatic, no cyanosis or edema Neurologic: Grossly normal    Labs:  CBC Latest Ref Rng & Units 10/07/2017 09/09/2017 07/30/2017  WBC 4.0 - 10.5 K/uL 8.3 5.4 5.3  Hemoglobin 12.0 - 15.0 g/dL 14.6 12.5 10.0(L)  Hematocrit 36.0 -  46.0 % 45.9 39.6 32.1(L)  Platelets 150 - 400 K/uL 362 348 413     Results for CAMIE, HAUSS (MRN [id number]) as of 11/19/2017 16:50  Ref. Range 07/10/2017 11:14  TSH Latest Ref Range: 0.350 - 4.500 uIU/mL 0.339 (L)    Assessment:   Menorrhagia now with prolonged cycle Morbid obesity HTN Abnormal thyroid test Cervical cancer screening Breast cancer screening  Plan:   - Patient has abnormal uterine bleeding . She has an exam with slightly enlarged uterus (but difficult to assess due to body habitus), but otherwise normal exam. Will order pelvic ultrasound to evaluate for any structural gynecologic abnormalities.  Endometrial biopsy performed today in light of risk factor of morbid obesity in the setting of abnormal bleeding (see procedure note below). She will f/u in 1 week to discuss findings.  - Abnormal TSH noted, patient notes that she was unaware of results.  Has not had any follow up. Will repeat and get thyroid panel as abnormalities could also be a cause of abnormal bleeding.  - Discussed management options for abnormal uterine bleeding including tranexamic acid (Lysteda), oral progesterone, Depo Provera, Levonogestrel IUD, endometrial ablation or hysterectomy as definitive surgical management.  Discussed risks and benefits of each method.   Patient now unsure of desires.  Printed patient education handouts were given to the patient to review at home. Aygestin given today to help with abnormal uterine  bleeding.  - Pap smear performed today in light of abnormal bleeding and remote history of last pap smear.  - Will order mammogram as patient is due and has never had one.  - HTN, and with a h/o tobacco use and age, is not a candidate for use of estrogen-containing products.     Endometrial Biopsy Procedure Note  The patient is positioned on the exam table in the dorsal lithotomy position. Bimanual exam confirms uterine position and size. A Graves speculum is placed into the vagina. The pipette is placed into the endocervical canal and is advanced to the uterine fundus. Using a piston like technique, with vacuum created by withdrawing the stylus, the endometrial specimen is obtained and transferred to the biopsy container. Minimal bleeding is encountered. The procedure is well tolerated.   Uterine Position: mid    Uterine Length:  9 cm   Uterine Specimen: Average   Post procedure instructions are given. The patient is scheduled for follow up appointment.   A total of 25 minutes were spent face-to-face with the patient during this encounter and over half of that time involved counseling and coordination of care.  Rubie Maid, MD [facility]

## 2017-11-19 NOTE — Progress Notes (Signed)
Pt is present today for ablation consult. Pt stated that she has prolong cycles. Pt stated that she started her cycle on 10/29/17 and her cycle is still on. Pt stated that her cycles are heavy and painful. Pt stated that she was taking a birth control but was unsure of the name of the medication.

## 2017-11-20 ENCOUNTER — Encounter: Payer: Self-pay | Admitting: Obstetrics and Gynecology

## 2017-11-20 DIAGNOSIS — Z6841 Body Mass Index (BMI) 40.0 and over, adult: Secondary | ICD-10-CM

## 2017-11-20 DIAGNOSIS — R7989 Other specified abnormal findings of blood chemistry: Secondary | ICD-10-CM | POA: Insufficient documentation

## 2017-11-20 DIAGNOSIS — N920 Excessive and frequent menstruation with regular cycle: Secondary | ICD-10-CM | POA: Insufficient documentation

## 2017-11-20 LAB — THYROID PANEL WITH TSH
Free Thyroxine Index: 2.2 (ref 1.2–4.9)
T3 Uptake Ratio: 21 % — ABNORMAL LOW (ref 24–39)
T4, Total: 10.6 ug/dL (ref 4.5–12.0)
TSH: 0.132 u[IU]/mL — ABNORMAL LOW (ref 0.450–4.500)

## 2017-11-25 LAB — CYTOLOGY - PAP
Diagnosis: NEGATIVE
HPV (WINDOPATH): NOT DETECTED

## 2017-11-26 ENCOUNTER — Encounter: Payer: Self-pay | Admitting: Oncology

## 2017-11-26 ENCOUNTER — Inpatient Hospital Stay (HOSPITAL_BASED_OUTPATIENT_CLINIC_OR_DEPARTMENT_OTHER): Payer: 59 | Admitting: Oncology

## 2017-11-26 ENCOUNTER — Inpatient Hospital Stay: Payer: 59 | Attending: Oncology

## 2017-11-26 ENCOUNTER — Other Ambulatory Visit: Payer: Self-pay

## 2017-11-26 VITALS — BP 138/82 | HR 93 | Temp 97.4°F | Resp 18 | Wt 233.9 lb

## 2017-11-26 DIAGNOSIS — Z7982 Long term (current) use of aspirin: Secondary | ICD-10-CM | POA: Insufficient documentation

## 2017-11-26 DIAGNOSIS — Z79899 Other long term (current) drug therapy: Secondary | ICD-10-CM

## 2017-11-26 DIAGNOSIS — F17210 Nicotine dependence, cigarettes, uncomplicated: Secondary | ICD-10-CM | POA: Diagnosis not present

## 2017-11-26 DIAGNOSIS — D500 Iron deficiency anemia secondary to blood loss (chronic): Secondary | ICD-10-CM | POA: Diagnosis present

## 2017-11-26 DIAGNOSIS — I10 Essential (primary) hypertension: Secondary | ICD-10-CM | POA: Diagnosis not present

## 2017-11-26 DIAGNOSIS — N920 Excessive and frequent menstruation with regular cycle: Secondary | ICD-10-CM | POA: Insufficient documentation

## 2017-11-26 DIAGNOSIS — N938 Other specified abnormal uterine and vaginal bleeding: Secondary | ICD-10-CM

## 2017-11-26 LAB — IRON AND TIBC
IRON: 42 ug/dL (ref 28–170)
Saturation Ratios: 10 % — ABNORMAL LOW (ref 10.4–31.8)
TIBC: 414 ug/dL (ref 250–450)
UIBC: 372 ug/dL

## 2017-11-26 LAB — CBC WITH DIFFERENTIAL/PLATELET
Abs Immature Granulocytes: 0.01 10*3/uL (ref 0.00–0.07)
Basophils Absolute: 0.1 10*3/uL (ref 0.0–0.1)
Basophils Relative: 1 %
EOS ABS: 0.2 10*3/uL (ref 0.0–0.5)
Eosinophils Relative: 4 %
HEMATOCRIT: 42.5 % (ref 36.0–46.0)
Hemoglobin: 13.3 g/dL (ref 12.0–15.0)
Immature Granulocytes: 0 %
LYMPHS ABS: 2.5 10*3/uL (ref 0.7–4.0)
Lymphocytes Relative: 43 %
MCH: 23.9 pg — ABNORMAL LOW (ref 26.0–34.0)
MCHC: 31.3 g/dL (ref 30.0–36.0)
MCV: 76.3 fL — AB (ref 80.0–100.0)
MONOS PCT: 5 %
Monocytes Absolute: 0.3 10*3/uL (ref 0.1–1.0)
NRBC: 0 % (ref 0.0–0.2)
Neutro Abs: 2.7 10*3/uL (ref 1.7–7.7)
Neutrophils Relative %: 47 %
Platelets: 343 10*3/uL (ref 150–400)
RBC: 5.57 MIL/uL — ABNORMAL HIGH (ref 3.87–5.11)
RDW: 17.2 % — AB (ref 11.5–15.5)
WBC: 5.7 10*3/uL (ref 4.0–10.5)

## 2017-11-26 LAB — FERRITIN: FERRITIN: 13 ng/mL (ref 11–307)

## 2017-11-26 NOTE — Progress Notes (Signed)
Patient here for follow up. Menstrual period has gotten lighter. She has seen GYN since last visit with Dr. Tasia Catchings. Continues to feel tired.

## 2017-11-27 ENCOUNTER — Ambulatory Visit (INDEPENDENT_AMBULATORY_CARE_PROVIDER_SITE_OTHER): Payer: 59 | Admitting: Obstetrics and Gynecology

## 2017-11-27 ENCOUNTER — Encounter: Payer: Self-pay | Admitting: Obstetrics and Gynecology

## 2017-11-27 ENCOUNTER — Ambulatory Visit (INDEPENDENT_AMBULATORY_CARE_PROVIDER_SITE_OTHER): Payer: 59

## 2017-11-27 VITALS — BP 119/87 | HR 106 | Ht 60.0 in | Wt 230.3 lb

## 2017-11-27 DIAGNOSIS — N938 Other specified abnormal uterine and vaginal bleeding: Secondary | ICD-10-CM

## 2017-11-27 DIAGNOSIS — N920 Excessive and frequent menstruation with regular cycle: Secondary | ICD-10-CM

## 2017-11-27 DIAGNOSIS — E0590 Thyrotoxicosis, unspecified without thyrotoxic crisis or storm: Secondary | ICD-10-CM | POA: Insufficient documentation

## 2017-11-27 NOTE — Progress Notes (Signed)
    GYNECOLOGY PROGRESS NOTE  Subjective:    Patient ID: [name], female    DOB: [birth year], 42 y.o.   MRN: [id number]  HPI  Patient is a 42 y.o. G39P2002 female who presents for follow up of ultrasound and labs.  Currently with dysfunctional uterine bleeding.  Patient notes that she is doing well on the Aygestin so far and that her bleeding has finally stopped.    The following portions of the patient's history were reviewed and updated as appropriate: allergies, current medications, past family history, past medical history, past social history, past surgical history and problem list.  Review of Systems Pertinent items noted in HPI and remainder of comprehensive ROS otherwise negative.   Objective:   Blood pressure 119/87, pulse (!) 106, height 5' (1.524 m), weight 230 lb 4.8 oz (104.5 kg), last menstrual period 10/29/2017. General appearance: alert and no distress Remainder of exam deferred.    Labs:   Results for ALITHEA, LAPAGE (MRN [id number]) as of 11/27/2017 18:59  Ref. Range 11/19/2017 11:48  TSH Latest Ref Range: 0.450 - 4.500 uIU/mL 0.132 (L)  Thyroxine (T4) Latest Ref Range: 4.5 - 12.0 ug/dL 10.6  Free Thyroxine Index Latest Ref Range: 1.2 - 4.9  2.2  T3 Uptake Ratio Latest Ref Range: 24 - 39 % 21 (L)    Cytology (Pap smear):  Component 8d ago  Adequacy Satisfactory for evaluation endocervical/transformation zone component PRESENT.   Diagnosis NEGATIVE FOR INTRAEPITHELIAL LESIONS OR MALIGNANCY.   HPV NOT DETECTED     Pathology: Diagnosis Endometrium, curettage - BENIGN INACTIVE ENDOMETRIUM WITH PROGESTIN-EFFECT. - SCANT BENIGN ENDOCERVICAL MUCOSA. - NEGATIVE FOR HYPERPLASIA OR MALIGNANCY.   Imaging:   ULTRASOUND REPORT  Location: [facility] Date of Service:  11/27/2017  Indications: AUB Findings:  The uterus measures 7.8 x 4.9 x 4.1 cm. Echo texture is homogeneous without evidence of focal masses.  The Endometrium measures 3.5  mm.  Right Ovary measures 2.0 x 1.6 x 1.3 cm. It is normal in appearance. Left Ovary measures 2.4 x 1.4 x 1.3 cm. It is normal appearance. Survey of the adnexa demonstrates no adnexal masses. There is no free fluid in the cul de sac.  Impression: 1. Anteverted uterus appears of normal size and contour. 2. Endometrium measures 3.5 mm. 3. Bilateral ovaries appear WNL.  Recommendations: 1.Clinical correlation with the patient's History and Physical Exam.  Dario Ave, RDMS   Assessment:   Dysfunctional uterine bleeding Subclinical hyperthyroidism  Plan:   1. DUB - Discussed ultrasound and lab results with patient.  No intrauterine masses or structural abnormalities noted on ultrasound. Discussed management options again for abnormal uterine bleeding including tranexamic acid (Lysteda), oral progesterone, Depo Provera, Levonogestrel IUD, endometrial ablation or hysterectomy as definitive surgical management. Patient is currently taking Aygestin for her bleeding and notes it has worked well so far. Would like to continue medication for now.   2. Subclinical hyperthyroidism - patient with low TSH and normal Free T4. Discussed that this would not likely require treatment at this time unless symptomatic. Will continue to monitor.   F/u in 3 months.    A total of 15 minutes were spent face-to-face with the patient during this encounter and over half of that time dealt with counseling and coordination of care.  Rubie Maid, MD [facility]

## 2017-11-27 NOTE — Progress Notes (Signed)
Pt is present today for a follow up from u/s from pelvic pain.

## 2017-11-28 NOTE — Progress Notes (Signed)
Hematology/Oncology follow up note [facility] [phone] [phone]   Patient Care Team: Remi Haggard, FNP as PCP - General (Family Medicine) Gala Romney Cristopher Estimable, MD as Consulting Physician (Gastroenterology)  REFERRING PROVIDER: Remi Haggard, FNP REASON FOR VISIT Follow up for treatment of anemia.   HISTORY OF PRESENTING ILLNESS:  [name] is a  42 y.o.  female with PMH listed below who was referred to me for evaluation of anemia. Most recent cbc showed hemoglobin 10, MCV 66.4, platelet 447.   History of iron deficiency: Denies. Rectal bleeding: denies Menstrual bleeding/ Vaginal bleeding : heavy menstrual period, uses a whole bag of pads for the first 3 days of period.  Hematemesis or hemoptysis : none  Blood in urine : none  Pica: craves for ice chips.  Last endoscopy: never.  Fatigue: yes.  INTERVAL HISTORY [name] is a 41 y.o. female who has above history reviewed by me today present for follow-up for management of iron deficiency anemia. Reports feeling tired, fatigue has worsened.  For menorrhalgia, She is seeing [facility] this afternoon. Started on Aygestin and abnormal uterus bleeding has stopped.  She had no showed for several appointments.   Review of Systems  Constitutional: Positive for malaise/fatigue. Negative for chills, fever and weight loss.  HENT: Negative for congestion, ear discharge, ear pain, nosebleeds, sinus pain and sore throat.   Eyes: Negative for double vision, photophobia, pain, discharge and redness.  Respiratory: Negative for cough, hemoptysis, sputum production, shortness of breath and wheezing.   Cardiovascular: Negative for chest pain, palpitations, orthopnea, claudication and leg swelling.  Gastrointestinal: Negative for abdominal pain, blood in stool, constipation, diarrhea, heartburn, melena, nausea and vomiting.  Genitourinary: Negative for dysuria, flank pain,  frequency and hematuria.  Musculoskeletal: Negative for back pain, myalgias and neck pain.  Skin: Negative for itching and rash.  Neurological: Negative for dizziness, tingling, tremors, focal weakness, weakness and headaches.  Endo/Heme/Allergies: Negative for environmental allergies. Does not bruise/bleed easily.  Psychiatric/Behavioral: Negative for depression and hallucinations. The patient is not nervous/anxious.     MEDICAL HISTORY:  Past Medical History:  Diagnosis Date  . Anemia   . Bipolar 1 disorder ([place])   . Fibromyalgia   . GERD (gastroesophageal reflux disease)   . History of degenerative disc disease   . Hypertension   . Manic depression ([place])   . osteoarthritis    osteoarthritis  . Sciatica     SURGICAL HISTORY: Past Surgical History:  Procedure Laterality Date  . ANKLE SURGERY Left    ORIF  . BACK SURGERY     x3  . CHOLECYSTECTOMY  1998   Dr. Anthony Sar  . ERCP  2005   Dr. Laural Golden: s/p sphincerotomy with removal of sludge and debris  . LUMBAR WOUND DEBRIDEMENT N/A 08/16/2015   Procedure: Spinal Cord Stimulator Revision;  Surgeon: Ashok Pall, MD;  Location: [place] [facility];  Service: Neurosurgery;  Laterality: N/A;  . SPINAL CORD STIMULATOR INSERTION N/A 08/16/2015   Procedure: LUMBAR SPINAL CORD STIMULATOR INSERTION;  Surgeon: Ashok Pall, MD;  Location: [place] [facility];  Service: Neurosurgery;  Laterality: N/A;  LUMBAR SPINAL CORD STIMULATOR INSERTION  . TUBAL LIGATION    . UMBILICAL HERNIA REPAIR  1995   [facility]    SOCIAL HISTORY: Social History   Socioeconomic History  . Marital status: Married    Spouse name: Not on file  . Number of children: 2  . Years of education: Not on  file  . Highest education level: Not on file  Occupational History  . Occupation: disabled  Social Needs  . Financial resource strain: Not on file  . Food insecurity:    Worry: Not on file    Inability: Not on file  . Transportation needs:    Medical: Not on file     Non-medical: Not on file  Tobacco Use  . Smoking status: Current Every Day Smoker    Packs/day: 1.00    Years: 20.00    Pack years: 20.00    Types: Cigarettes  . Smokeless tobacco: Never Used  Substance and Sexual Activity  . Alcohol use: No  . Drug use: Yes    Types: Marijuana    Comment: none since 07/2016  . Sexual activity: Yes    Birth control/protection: Surgical  Lifestyle  . Physical activity:    Days per week: Not on file    Minutes per session: Not on file  . Stress: Not on file  Relationships  . Social connections:    Talks on phone: Not on file    Gets together: Not on file    Attends religious service: Not on file    Active member of club or organization: Not on file    Attends meetings of clubs or organizations: Not on file    Relationship status: Not on file  . Intimate partner violence:    Fear of current or ex partner: Not on file    Emotionally abused: Not on file    Physically abused: Not on file    Forced sexual activity: Not on file  Other Topics Concern  . Not on file  Social History Narrative  . Not on file    FAMILY HISTORY: Family History  Problem Relation Age of Onset  . Crohn's disease Maternal Aunt   . Rheum arthritis Mother   . Diabetes Mother   . Clotting disorder Mother   . Colon cancer Neg Hx     ALLERGIES:  is allergic to vicodin [hydrocodone-acetaminophen]; acetaminophen; hydrocil [psyllium]; and percocet [oxycodone-acetaminophen].  MEDICATIONS:  Current Outpatient Medications  Medication Sig Dispense Refill  . ALPRAZolam (XANAX) 1 MG tablet Take 1 mg by mouth 3 (three) times daily as needed for anxiety.    Marland Kitchen amitriptyline (ELAVIL) 150 MG tablet Take 1 tablet by mouth at bedtime.    . Aspirin-Acetaminophen-Caffeine (EXCEDRIN PO) Take 1-2 tablets by mouth 2 (two) times daily as needed (headaches).     . baclofen (LIORESAL) 10 MG tablet     . Buprenorphine HCl-Naloxone HCl (SUBOXONE) 8-2 MG FILM Place 1 Film under the tongue 3  (three) times daily.    Marland Kitchen DEXILANT 60 MG capsule Take 60 mg by mouth daily.    Marland Kitchen gabapentin (NEURONTIN) 600 MG tablet Take 600 mg by mouth 2 (two) times daily.     Marland Kitchen lisinopril-hydrochlorothiazide (PRINZIDE,ZESTORETIC) 20-25 MG tablet Take 1 tablet by mouth daily.    . norethindrone (AYGESTIN) 5 MG tablet Take 1 tablet (5 mg total) by mouth daily. 30 tablet 2  . umeclidinium-vilanterol (ANORO ELLIPTA) 62.5-25 MCG/INH AEPB Inhale 1 puff into the lungs daily.    . varenicline (CHANTIX) 0.5 MG tablet Take 1 tablet (0.5 mg total) by mouth 2 (two) times daily. 60 tablet 0   No current facility-administered medications for this visit.    Facility-Administered Medications Ordered in Other Visits  Medication Dose Route Frequency Provider Last Rate Last Dose  . ondansetron (ZOFRAN) 4 mg in sodium chloride 0.9 %  50 mL IVPB  4 mg Intravenous Q6H PRN Ashok Pall, MD         PHYSICAL EXAMINATION: ECOG PERFORMANCE STATUS: 1 - Symptomatic but completely ambulatory Vitals:   11/26/17 1446  BP: 138/82  Pulse: 93  Resp: 18  Temp: (!) 97.4 F (36.3 C)   Filed Weights   11/26/17 1446  Weight: 233 lb 14.4 oz (106.1 kg)    Physical Exam  Constitutional: She is oriented to person, place, and time. No distress.  Obese  HENT:  Head: Normocephalic and atraumatic.  Mouth/Throat: Oropharynx is clear and moist.  Eyes: Pupils are equal, round, and reactive to light. EOM are normal. No scleral icterus.  Neck: Normal range of motion. Neck supple.  Cardiovascular: Normal rate, regular rhythm and normal heart sounds.  Pulmonary/Chest: Effort normal and breath sounds normal. No respiratory distress. She has no wheezes.  Abdominal: Soft. Bowel sounds are normal. She exhibits no distension and no mass. There is no tenderness.  Musculoskeletal: Normal range of motion. She exhibits no edema or deformity.  Lymphadenopathy:    She has no cervical adenopathy.  Neurological: She is alert and oriented to person,  place, and time. No cranial nerve deficit. Coordination normal.  Skin: Skin is warm and dry. No rash noted. No erythema.  Psychiatric: She has a normal mood and affect. Her behavior is normal. Thought content normal.     LABORATORY DATA:  I have reviewed the data as listed Lab Results  Component Value Date   WBC 5.7 11/26/2017   HGB 13.3 11/26/2017   HCT 42.5 11/26/2017   MCV 76.3 (L) 11/26/2017   PLT 343 11/26/2017   Recent Labs    12/19/16 1348 05/10/17 1738 10/07/17 1550  NA 136 137 141  K 3.4* 3.4* 3.4*  CL 101 104 111  CO2 27 22 22   GLUCOSE 72 123* 118*  BUN 17 8 9   CREATININE 0.90 0.64 0.70  CALCIUM 9.2 8.9 8.7*  GFRNONAA >60 >60 >60  GFRAA >60 >60 >60  PROT  --  7.1 7.3  ALBUMIN  --  3.4* 3.7  AST  --  18 16  ALT  --  14 15  ALKPHOS  --  72 61  BILITOT  --  0.4 0.7       ASSESSMENT & PLAN:  1. Iron deficiency anemia due to chronic blood loss   2. DUB (dysfunctional uterine bleeding)    #Labs are reviewed and discussed with patient. Hemoglobin remained stable, hemoglobin at 13.3. MCV improved to 76.3. Iron panel consistent with iron deficiency.  Ferritin decreased from 84 to 13.  Plan proceed with additional IV Venofer 200 mg weekly x2. Advised patient to continue take oral iron supplementation ferrous sulfate 325 mg daily Follow up with GYN  All questions were answered. The patient knows to call the clinic with any problems questions or concerns.  Return of visit: 4 months.  Total face to face encounter time for this patient visit was 15 min. >50% of the time was  spent in counseling and coordination of care.   Earlie Server, MD, PhD Hematology Oncology [facility] Pager- [phone] 11/28/2017

## 2017-12-04 ENCOUNTER — Telehealth: Payer: Self-pay | Admitting: Obstetrics and Gynecology

## 2017-12-04 NOTE — Telephone Encounter (Signed)
The patient states she is bleeding again, and is asking for her nurse/provider to contact her, please advise, thanks.

## 2017-12-04 NOTE — Telephone Encounter (Signed)
Pt called no answer and unable to leave a voicemail due to no voicemail being set up.

## 2017-12-08 ENCOUNTER — Inpatient Hospital Stay: Payer: 59

## 2017-12-08 VITALS — BP 113/76 | HR 93 | Temp 96.8°F | Resp 18

## 2017-12-08 DIAGNOSIS — D509 Iron deficiency anemia, unspecified: Secondary | ICD-10-CM

## 2017-12-08 DIAGNOSIS — D500 Iron deficiency anemia secondary to blood loss (chronic): Secondary | ICD-10-CM | POA: Diagnosis not present

## 2017-12-08 MED ORDER — IRON SUCROSE 20 MG/ML IV SOLN
200.0000 mg | Freq: Once | INTRAVENOUS | Status: AC
  Administered 2017-12-08: 200 mg via INTRAVENOUS
  Filled 2017-12-08: qty 10

## 2017-12-08 MED ORDER — SODIUM CHLORIDE 0.9 % IV SOLN
Freq: Once | INTRAVENOUS | Status: AC
  Administered 2017-12-08: 14:00:00 via INTRAVENOUS
  Filled 2017-12-08: qty 250

## 2017-12-09 NOTE — Telephone Encounter (Signed)
Pt called and was unable to reach pt due to no voicemail has not been set up.

## 2017-12-11 NOTE — Telephone Encounter (Signed)
Pt called no answer and no voicemail is set up to leave a message.

## 2017-12-15 ENCOUNTER — Inpatient Hospital Stay: Payer: 59

## 2017-12-15 VITALS — BP 114/80 | HR 83 | Temp 98.7°F | Resp 18

## 2017-12-15 DIAGNOSIS — D509 Iron deficiency anemia, unspecified: Secondary | ICD-10-CM

## 2017-12-15 DIAGNOSIS — D500 Iron deficiency anemia secondary to blood loss (chronic): Secondary | ICD-10-CM | POA: Diagnosis not present

## 2017-12-15 MED ORDER — SODIUM CHLORIDE 0.9 % IV SOLN
Freq: Once | INTRAVENOUS | Status: AC
  Administered 2017-12-15: 14:00:00 via INTRAVENOUS
  Filled 2017-12-15: qty 250

## 2017-12-15 MED ORDER — IRON SUCROSE 20 MG/ML IV SOLN
200.0000 mg | Freq: Once | INTRAVENOUS | Status: AC
  Administered 2017-12-15: 200 mg via INTRAVENOUS
  Filled 2017-12-15: qty 10

## 2018-01-06 ENCOUNTER — Ambulatory Visit (INDEPENDENT_AMBULATORY_CARE_PROVIDER_SITE_OTHER): Payer: 59 | Admitting: Obstetrics and Gynecology

## 2018-01-06 ENCOUNTER — Encounter: Payer: Self-pay | Admitting: Obstetrics and Gynecology

## 2018-01-06 VITALS — BP 159/102 | HR 76 | Ht 60.0 in | Wt 257.6 lb

## 2018-01-06 DIAGNOSIS — I10 Essential (primary) hypertension: Secondary | ICD-10-CM

## 2018-01-06 DIAGNOSIS — N938 Other specified abnormal uterine and vaginal bleeding: Secondary | ICD-10-CM

## 2018-01-06 NOTE — Progress Notes (Signed)
Pt stated that she is bleeding really heavy. Pt stated that she cramping really bad and is in a lot of pain.

## 2018-01-06 NOTE — Progress Notes (Signed)
    GYNECOLOGY PROGRESS NOTE  Subjective:    Patient ID: [name], female    DOB: Feb 28, 1975, 42 y.o.   MRN: [id number]  HPI  Patient is a 42 y.o. G63P2002 female who presents for follow up.  She is currently on Aygestin 5 mg for her dysfunctional uterine bleeding. Has had negative workup thus far (including ultrasound and endometrial biopsy in October). Notes that she was doing well initially, however now she is beginning to have abnormal bleeding again and cramping.  Notes that at this time she would like to discuss her options again for management.   The following portions of the patient's history were reviewed and updated as appropriate: allergies, current medications, past family history, past medical history, past social history, past surgical history and problem list.  Review of Systems Pertinent items noted in HPI and remainder of comprehensive ROS otherwise negative.   Objective:   Blood pressure (!) 159/102, pulse 76, height 5' (1.524 m), weight 257 lb 9.6 oz (116.8 kg). Body mass index is 50.31 kg/m.   General appearance: alert and no distress Abdomen: soft, non-tender; bowel sounds normal; no masses,  no organomegaly Pelvic: deferred   Assessment:   Dysfunctional uterine bleeding Morbid obesity  Plan:   - Discussed management options again for abnormal uterine bleeding including tranexamic acid (Lysteda), oral progesterone, Depo Provera, Levonogestrel IUD, endometrial ablation or hysterectomy as definitive surgical management. Patient undecided between hysterectomy or endometrial ablation. If hysterectomy desired, would need to consider performing robotically as patient with morbid obesity.  - To f/u in 2-3 weeks for further discussion of options. Can increase Aygestin to 10 mg daily until that time to further help with bleeding.  - Hypertension uncontrolled. Patient notes that she did not yet take her blood pressure medication today. Encouraged compliance.   A  total of 15 minutes were spent face-to-face with the patient during this encounter and over half of that time dealt with counseling and coordination of care.   Kathleen Maid, MD [facility]

## 2018-01-10 ENCOUNTER — Encounter: Payer: Self-pay | Admitting: Obstetrics and Gynecology

## 2018-01-19 ENCOUNTER — Encounter: Payer: Self-pay | Admitting: Obstetrics and Gynecology

## 2018-01-19 ENCOUNTER — Ambulatory Visit (INDEPENDENT_AMBULATORY_CARE_PROVIDER_SITE_OTHER): Payer: 59 | Admitting: Obstetrics and Gynecology

## 2018-01-19 VITALS — BP 146/90 | HR 97 | Ht 60.0 in | Wt 239.4 lb

## 2018-01-19 DIAGNOSIS — Z862 Personal history of diseases of the blood and blood-forming organs and certain disorders involving the immune mechanism: Secondary | ICD-10-CM | POA: Diagnosis not present

## 2018-01-19 DIAGNOSIS — N939 Abnormal uterine and vaginal bleeding, unspecified: Secondary | ICD-10-CM

## 2018-01-19 DIAGNOSIS — I10 Essential (primary) hypertension: Secondary | ICD-10-CM

## 2018-01-19 NOTE — H&P (Signed)
GYNECOLOGY PREOPERATIVE HISTORY AND PHYSICAL   Subjective:  [name] is a 42 y.o. [id number] here for surgical management of abnormal uterine bleeding and history anemia. She is currently s/p failed medical management of bleeding with Aygestin. Significant preoperative concerns include: morbid obesity, HTN.  Proposed surgery: Hysteroscopy D&C with [place] endometrial ablation   Pertinent Gynecological History: Menses: patient has had continuous light to moderate bleeding for the past 2 months.  Bleeding: abnormal uterine bleeding Contraception: tubal ligation Last pap: normal Date: 11/02/2017.    Past Medical History:  Diagnosis Date  . Anemia   . Bipolar 1 disorder ([place])   . Fibromyalgia   . GERD (gastroesophageal reflux disease)   . History of degenerative disc disease   . Hypertension   . Manic depression ([place])   . osteoarthritis    osteoarthritis  . Sciatica    Past Surgical History:  Procedure Laterality Date  . ANKLE SURGERY Left    ORIF  . BACK SURGERY     x3  . CHOLECYSTECTOMY  1998   Dr. Anthony Sar  . ERCP  2005   Dr. Laural Golden: s/p sphincerotomy with removal of sludge and debris  . LUMBAR WOUND DEBRIDEMENT N/A 08/16/2015   Procedure: Spinal Cord Stimulator Revision;  Surgeon: Ashok Pall, MD;  Location: [place] [facility];  Service: Neurosurgery;  Laterality: N/A;  . SPINAL CORD STIMULATOR INSERTION N/A 08/16/2015   Procedure: LUMBAR SPINAL CORD STIMULATOR INSERTION;  Surgeon: Ashok Pall, MD;  Location: [place] [facility];  Service: Neurosurgery;  Laterality: N/A;  LUMBAR SPINAL CORD STIMULATOR INSERTION  . TUBAL LIGATION    . UMBILICAL HERNIA REPAIR  1995   [facility]   OB History  Gravida Para Term Preterm AB Living  2 2 2     2   SAB TAB Ectopic Multiple Live Births          2    # Outcome Date GA Lbr Len/2nd Weight Sex Delivery Anes PTL Lv  2 Term 1998    M Vag-Spont   LIV  1 Term 1995    F Vag-Spont   LIV    Family History  Problem Relation Age of  Onset  . Crohn's disease Maternal Aunt   . Rheum arthritis Mother   . Diabetes Mother   . Clotting disorder Mother   . Colon cancer Neg Hx     Social History   Socioeconomic History  . Marital status: Married    Spouse name: Not on file  . Number of children: 2  . Years of education: Not on file  . Highest education level: Not on file  Occupational History  . Occupation: disabled  Social Needs  . Financial resource strain: Not on file  . Food insecurity:    Worry: Not on file    Inability: Not on file  . Transportation needs:    Medical: Not on file    Non-medical: Not on file  Tobacco Use  . Smoking status: Current Every Day Smoker    Packs/day: 1.00    Years: 20.00    Pack years: 20.00    Types: Cigarettes  . Smokeless tobacco: Never Used  Substance and Sexual Activity  . Alcohol use: No  . Drug use: Yes    Types: Marijuana    Comment: none since 07/2016  . Sexual activity: Yes    Birth control/protection: Surgical  Lifestyle  . Physical activity:    Days per week: Not on file    Minutes  per session: Not on file  . Stress: Not on file  Relationships  . Social connections:    Talks on phone: Not on file    Gets together: Not on file    Attends religious service: Not on file    Active member of club or organization: Not on file    Attends meetings of clubs or organizations: Not on file    Relationship status: Not on file  . Intimate partner violence:    Fear of current or ex partner: Not on file    Emotionally abused: Not on file    Physically abused: Not on file    Forced sexual activity: Not on file  Other Topics Concern  . Not on file  Social History Narrative  . Not on file    Current Outpatient Medications on File Prior to Visit  Medication Sig Dispense Refill  . ALPRAZolam (XANAX) 1 MG tablet Take 1 mg by mouth 3 (three) times daily as needed for anxiety.    Marland Kitchen amitriptyline (ELAVIL) 150 MG tablet Take 1 tablet by mouth at bedtime.    .  Aspirin-Acetaminophen-Caffeine (EXCEDRIN PO) Take 1-2 tablets by mouth 2 (two) times daily as needed (headaches).     . baclofen (LIORESAL) 10 MG tablet     . Buprenorphine HCl-Naloxone HCl (SUBOXONE) 8-2 MG FILM Place 1 Film under the tongue 3 (three) times daily.    Marland Kitchen DEXILANT 60 MG capsule Take 60 mg by mouth daily.    Marland Kitchen gabapentin (NEURONTIN) 600 MG tablet Take 600 mg by mouth 2 (two) times daily.     Marland Kitchen lisinopril-hydrochlorothiazide (PRINZIDE,ZESTORETIC) 20-25 MG tablet Take 1 tablet by mouth daily.    . norethindrone (AYGESTIN) 5 MG tablet Take 1 tablet (5 mg total) by mouth daily. 30 tablet 2  . umeclidinium-vilanterol (ANORO ELLIPTA) 62.5-25 MCG/INH AEPB Inhale 1 puff into the lungs daily.    . varenicline (CHANTIX) 0.5 MG tablet Take 1 tablet (0.5 mg total) by mouth 2 (two) times daily. 60 tablet 0   Current Facility-Administered Medications on File Prior to Visit  Medication Dose Route Frequency Provider Last Rate Last Dose  . ondansetron (ZOFRAN) 4 mg in sodium chloride 0.9 % 50 mL IVPB  4 mg Intravenous Q6H PRN Ashok Pall, MD        Allergies  Allergen Reactions  . Vicodin [Hydrocodone-Acetaminophen] Nausea And Vomiting  . Acetaminophen Nausea Only  . Hydrocil [Psyllium] Nausea Only  . Percocet [Oxycodone-Acetaminophen] Nausea Only     Review of Systems Constitutional: No recent fever/chills/sweats Respiratory: No recent cough/bronchitis Cardiovascular: No chest pain Gastrointestinal: No recent nausea/vomiting/diarrhea Genitourinary: No UTI symptoms Hematologic/lymphatic:No history of coagulopathy or recent blood thinner use    Objective:   Blood pressure (!) 146/90, pulse 97, height 5' (1.524 m), weight 239 lb 6.4 oz (108.6 kg). CONSTITUTIONAL: Well-developed, morbidly obese female in no acute distress.  HENT:  Normocephalic, atraumatic, External right and left ear normal. Oropharynx is clear and moist EYES: Conjunctivae and EOM are normal. Pupils are equal,  round, and reactive to light. No scleral icterus.  NECK: Normal range of motion, supple, no masses SKIN: Skin is warm and dry. No rash noted. Not diaphoretic. No erythema. No pallor. NEUROLOGIC: Alert and oriented to person, place, and time. Normal reflexes, muscle tone coordination. No cranial nerve deficit noted. PSYCHIATRIC: Normal mood and affect. Normal behavior. Normal judgment and thought content. CARDIOVASCULAR: Normal heart rate noted, regular rhythm RESPIRATORY: Effort and breath sounds normal, no problems with respiration noted  ABDOMEN: Soft, nontender, nondistended. PELVIC: Deferred MUSCULOSKELETAL: Normal range of motion. No edema and no tenderness. 2+ distal pulses.    Labs:  Lab Results  Component Value Date   WBC 5.7 11/26/2017   HGB 13.3 11/26/2017   HCT 42.5 11/26/2017   MCV 76.3 (L) 11/26/2017   PLT 343 11/26/2017    Endometrial Biopsy:  Endometrium, curettage - BENIGN INACTIVE ENDOMETRIUM WITH PROGESTIN-EFFECT. - SCANT BENIGN ENDOCERVICAL MUCOSA. - NEGATIVE FOR HYPERPLASIA OR MALIGNANCY.  Imaging Studies: US Transvaginal Non-OB ULTRASOUND REPORT  Location: [facility] Date of Service:  11/27/2017  Indications: AUB Findings:  The uterus measures 7.8 x 4.9 x 4.1 cm. Echo texture is homogeneous without evidence of focal masses.  The Endometrium measures 3.5 mm.  Right Ovary measures 2.0 x 1.6 x 1.3 cm. It is normal in appearance. Left Ovary measures 2.4 x 1.4 x 1.3 cm. It is normal appearance. Survey of the adnexa demonstrates no adnexal masses. There is no free fluid in the cul de sac.  Impression: 1. Anteverted uterus appears of normal size and contour. 2. Endometrium measures 3.5 mm. 3. Bilateral ovaries appear WNL.  Recommendations: 1.Clinical correlation with the patient's History and Physical Exam.  Dario Ave, RDMS  I have reviewed this study and agree with documented findings.   Rubie Maid, MD [facility]    Assessment:    Abnormal uterine bleeding History of anemia Morbid obesity HTN   Plan:    Counseling: Procedure, risks, reasons, benefits and complications (including injury to bowel, bladder, major blood vessel, ureter, bleeding, possibility of transfusion, infection, or fistula formation) reviewed in detail. Likelihood of success in alleviating the patient's condition was discussed. Routine postoperative instructions will be reviewed with the patient and her family in detail after surgery.  The patient concurred with the proposed plan, giving informed written consent for the surgery.   Preop testing ordered. Instructions reviewed, including NPO after midnight. Encouraged to continue BP meds as recommended.     Rubie Maid, MD [facility]

## 2018-01-19 NOTE — Progress Notes (Signed)
    GYNECOLOGY PROGRESS NOTE  Subjective:    Patient ID: [name], female    DOB: 10/19/1975, 42 y.o.   MRN: [id number]  HPI  Patient is a 42 y.o. G48P2002 female who presents for complaints of continued vaginal bleeding. Was seen 2 weeks ago for continued bleeding on Aygestin 5 mg.  Advised to increase to 10 mg.  She still notes that she is bleeding daily, although is much lighter.  Patient states that she desires to just go ahead and proceed with surgery as she is worried that she will never be able to get her iron levels up to the point of no longer having to receive iron infusions. Desires to discuss surgical options again.   The following portions of the patient's history were reviewed and updated as appropriate: allergies, current medications, past family history, past medical history, past social history, past surgical history and problem list.  Review of Systems Pertinent items noted in HPI and remainder of comprehensive ROS otherwise negative.   Objective:   Blood pressure (!) 146/90, pulse 97, height 5' (1.524 m), weight 239 lb 6.4 oz (108.6 kg). Body mass index is 46.75 kg/m.  General appearance: alert, no distress and morbidly obese See H&P for remainder of exam.    Assessment:   Abnormal uterine bleeding Morbid obesity  Plan:   Discussion had again regarding endometrial ablation vs definitive management with hysterectomy. Discussed risks and benefits of each, as well as nature of procedure, post-operative expectations, and recovery time.  If hysterectomy were performed, would likely need to be performed robotically due to morbid obesity. Patient notes understanding. States that she would prefer to have the ablation.  Will schedule for Hysteroscopy D&C with endometrial ablation on 01/26/2018.    A total of 15 minutes were spent face-to-face with the patient during this encounter and over half of that time dealt with counseling and coordination of care.  Rubie Maid, MD [facility]

## 2018-01-19 NOTE — Progress Notes (Signed)
Pt is present today to discuss surgery.  Pt stated that she is having some hot flashes.

## 2018-01-19 NOTE — Patient Instructions (Signed)
Endometrial Ablation Endometrial ablation is a procedure that destroys the thin inner layer of the lining of the uterus (endometrium). This procedure may be done:  To stop heavy periods.  To stop bleeding that is causing anemia.  To control irregular bleeding.  To treat bleeding caused by small tumors (fibroids) in the endometrium. This procedure is often an alternative to major surgery, such as removal of the uterus and cervix (hysterectomy). As a result of this procedure:  You may not be able to have children. However, if you are premenopausal (you have not gone through menopause): ? You may still have a small chance of getting pregnant. ? You will need to use a reliable method of birth control after the procedure to prevent pregnancy.  You may stop having a menstrual period, or you may have only a small amount of bleeding during your period. Menstruation may return several years after the procedure. Tell a health care provider about:  Any allergies you have.  All medicines you are taking, including vitamins, herbs, eye drops, creams, and over-the-counter medicines.  Any problems you or family members have had with the use of anesthetic medicines.  Any blood disorders you have.  Any surgeries you have had.  Any medical conditions you have. What are the risks? Generally, this is a safe procedure. However, problems may occur, including:  A hole (perforation) in the uterus or bowel.  Infection of the uterus, bladder, or vagina.  Bleeding.  Damage to other structures or organs.  An air bubble in the lung (air embolus).  Problems with pregnancy after the procedure.  Failure of the procedure.  Decreased ability to diagnose cancer in the endometrium. What happens before the procedure?  You will have tests of your endometrium to make sure there are no pre-cancerous cells or cancer cells present.  You may have an ultrasound of the uterus.  You may be given medicines to  thin the endometrium.  Ask your health care provider about: ? Changing or stopping your regular medicines. This is especially important if you take diabetes medicines or blood thinners. ? Taking medicines such as aspirin and ibuprofen. These medicines can thin your blood. Do not take these medicines before your procedure if your doctor tells you not to.  Plan to have someone take you home from the hospital or clinic. What happens during the procedure?   You will lie on an exam table with your feet and legs supported as in a pelvic exam.  To lower your risk of infection: ? Your health care team will wash or sanitize their hands and put on germ-free (sterile) gloves. ? Your genital area will be washed with soap.  An IV tube will be inserted into one of your veins.  You will be given a medicine to help you relax (sedative).  A surgical instrument with a light and camera (resectoscope) will be inserted into your vagina and moved into your uterus. This allows your surgeon to see inside your uterus.  Endometrial tissue will be removed using one of the following methods: ? Radiofrequency. This method uses a radiofrequency-alternating electric current to remove the endometrium. ? Cryotherapy. This method uses extreme cold to freeze the endometrium. ? Heated-free liquid. This method uses a heated saltwater (saline) solution to remove the endometrium. ? Microwave. This method uses high-energy microwaves to heat up the endometrium and remove it. ? Thermal balloon. This method involves inserting a catheter with a balloon tip into the uterus. The balloon tip is filled with   method uses a heated saltwater (saline) solution to remove the endometrium.  ? Microwave. This method uses high-energy microwaves to heat up the endometrium and remove it.  ? Thermal balloon. This method involves inserting a catheter with a balloon tip into the uterus. The balloon tip is filled with heated fluid to remove the endometrium.  The procedure may vary among health care providers and hospitals.  What happens after the procedure?  · Your blood pressure, heart rate, breathing rate, and blood oxygen level will be monitored until the medicines you were given have worn off.  · As tissue healing occurs, you may notice  vaginal bleeding for 4-6 weeks after the procedure. You may also experience:  ? Cramps.  ? Thin, watery vaginal discharge that is light pink or brown in color.  ? A need to urinate more frequently than usual.  ? Nausea.  · Do not drive for 24 hours if you were given a sedative.  · Do not have sex or insert anything into your vagina until your health care provider approves.  Summary  · Endometrial ablation is done to treat the many causes of heavy menstrual bleeding.  · The procedure may be done only after medications have been tried to control the bleeding.  · Plan to have someone take you home from the hospital or clinic.  This information is not intended to replace advice given to you by your health care provider. Make sure you discuss any questions you have with your health care provider.  Document Released: 11/17/2003 Document Revised: 06/24/2017 Document Reviewed: 01/25/2016  [employer] Interactive Patient Education © 2019 [employer]

## 2018-01-19 NOTE — H&P (View-Only) (Signed)
GYNECOLOGY PREOPERATIVE HISTORY AND PHYSICAL   Subjective:  [name] is a 42 y.o. [id number] here for surgical management of abnormal uterine bleeding and history anemia. She is currently s/p failed medical management of bleeding with Aygestin. Significant preoperative concerns include: morbid obesity, HTN.  Proposed surgery: Hysteroscopy D&C with Minerva endometrial ablation   Pertinent Gynecological History: Menses: patient has had continuous light to moderate bleeding for the past 2 months.  Bleeding: abnormal uterine bleeding Contraception: tubal ligation Last pap: normal Date: 11/02/2017.    Past Medical History:  Diagnosis Date  . Anemia   . Bipolar 1 disorder ([place])   . Fibromyalgia   . GERD (gastroesophageal reflux disease)   . History of degenerative disc disease   . Hypertension   . Manic depression ([place])   . osteoarthritis    osteoarthritis  . Sciatica    Past Surgical History:  Procedure Laterality Date  . ANKLE SURGERY Left    ORIF  . BACK SURGERY     x3  . CHOLECYSTECTOMY  1998   Dr. Anthony Sar  . ERCP  2005   Dr. Laural Golden: s/p sphincerotomy with removal of sludge and debris  . LUMBAR WOUND DEBRIDEMENT N/A 08/16/2015   Procedure: Spinal Cord Stimulator Revision;  Surgeon: Ashok Pall, MD;  Location: [facility];  Service: Neurosurgery;  Laterality: N/A;  . SPINAL CORD STIMULATOR INSERTION N/A 08/16/2015   Procedure: LUMBAR SPINAL CORD STIMULATOR INSERTION;  Surgeon: Ashok Pall, MD;  Location: [facility];  Service: Neurosurgery;  Laterality: N/A;  LUMBAR SPINAL CORD STIMULATOR INSERTION  . TUBAL LIGATION    . UMBILICAL HERNIA REPAIR  1995   [facility]   OB History  Gravida Para Term Preterm AB Living  2 2 2     2   SAB TAB Ectopic Multiple Live Births          2    # Outcome Date GA Lbr Len/2nd Weight Sex Delivery Anes PTL Lv  2 Term 1998    M Vag-Spont   LIV  1 Term 1995    F Vag-Spont   LIV    Family History  Problem Relation Age of  Onset  . Crohn's disease Maternal Aunt   . Rheum arthritis Mother   . Diabetes Mother   . Clotting disorder Mother   . Colon cancer Neg Hx     Social History   Socioeconomic History  . Marital status: Married    Spouse name: Not on file  . Number of children: 2  . Years of education: Not on file  . Highest education level: Not on file  Occupational History  . Occupation: disabled  Social Needs  . Financial resource strain: Not on file  . Food insecurity:    Worry: Not on file    Inability: Not on file  . Transportation needs:    Medical: Not on file    Non-medical: Not on file  Tobacco Use  . Smoking status: Current Every Day Smoker    Packs/day: 1.00    Years: 20.00    Pack years: 20.00    Types: Cigarettes  . Smokeless tobacco: Never Used  Substance and Sexual Activity  . Alcohol use: No  . Drug use: Yes    Types: Marijuana    Comment: none since 07/2016  . Sexual activity: Yes    Birth control/protection: Surgical  Lifestyle  . Physical activity:    Days per week: Not on file    Minutes  per session: Not on file  . Stress: Not on file  Relationships  . Social connections:    Talks on phone: Not on file    Gets together: Not on file    Attends religious service: Not on file    Active member of club or organization: Not on file    Attends meetings of clubs or organizations: Not on file    Relationship status: Not on file  . Intimate partner violence:    Fear of current or ex partner: Not on file    Emotionally abused: Not on file    Physically abused: Not on file    Forced sexual activity: Not on file  Other Topics Concern  . Not on file  Social History Narrative  . Not on file    Current Outpatient Medications on File Prior to Visit  Medication Sig Dispense Refill  . ALPRAZolam (XANAX) 1 MG tablet Take 1 mg by mouth 3 (three) times daily as needed for anxiety.    Marland Kitchen amitriptyline (ELAVIL) 150 MG tablet Take 1 tablet by mouth at bedtime.    .  Aspirin-Acetaminophen-Caffeine (EXCEDRIN PO) Take 1-2 tablets by mouth 2 (two) times daily as needed (headaches).     . baclofen (LIORESAL) 10 MG tablet     . Buprenorphine HCl-Naloxone HCl (SUBOXONE) 8-2 MG FILM Place 1 Film under the tongue 3 (three) times daily.    Marland Kitchen DEXILANT 60 MG capsule Take 60 mg by mouth daily.    Marland Kitchen gabapentin (NEURONTIN) 600 MG tablet Take 600 mg by mouth 2 (two) times daily.     Marland Kitchen lisinopril-hydrochlorothiazide (PRINZIDE,ZESTORETIC) 20-25 MG tablet Take 1 tablet by mouth daily.    . norethindrone (AYGESTIN) 5 MG tablet Take 1 tablet (5 mg total) by mouth daily. 30 tablet 2  . umeclidinium-vilanterol (ANORO ELLIPTA) 62.5-25 MCG/INH AEPB Inhale 1 puff into the lungs daily.    . varenicline (CHANTIX) 0.5 MG tablet Take 1 tablet (0.5 mg total) by mouth 2 (two) times daily. 60 tablet 0   Current Facility-Administered Medications on File Prior to Visit  Medication Dose Route Frequency Provider Last Rate Last Dose  . ondansetron (ZOFRAN) 4 mg in sodium chloride 0.9 % 50 mL IVPB  4 mg Intravenous Q6H PRN Ashok Pall, MD        Allergies  Allergen Reactions  . Vicodin [Hydrocodone-Acetaminophen] Nausea And Vomiting  . Acetaminophen Nausea Only  . Hydrocil [Psyllium] Nausea Only  . Percocet [Oxycodone-Acetaminophen] Nausea Only     Review of Systems Constitutional: No recent fever/chills/sweats Respiratory: No recent cough/bronchitis Cardiovascular: No chest pain Gastrointestinal: No recent nausea/vomiting/diarrhea Genitourinary: No UTI symptoms Hematologic/lymphatic:No history of coagulopathy or recent blood thinner use    Objective:   Blood pressure (!) 146/90, pulse 97, height 5' (1.524 m), weight 239 lb 6.4 oz (108.6 kg). CONSTITUTIONAL: Well-developed, morbidly obese female in no acute distress.  HENT:  Normocephalic, atraumatic, External right and left ear normal. Oropharynx is clear and moist EYES: Conjunctivae and EOM are normal. Pupils are equal,  round, and reactive to light. No scleral icterus.  NECK: Normal range of motion, supple, no masses SKIN: Skin is warm and dry. No rash noted. Not diaphoretic. No erythema. No pallor. NEUROLOGIC: Alert and oriented to person, place, and time. Normal reflexes, muscle tone coordination. No cranial nerve deficit noted. PSYCHIATRIC: Normal mood and affect. Normal behavior. Normal judgment and thought content. CARDIOVASCULAR: Normal heart rate noted, regular rhythm RESPIRATORY: Effort and breath sounds normal, no problems with respiration noted  ABDOMEN: Soft, nontender, nondistended. PELVIC: Deferred MUSCULOSKELETAL: Normal range of motion. No edema and no tenderness. 2+ distal pulses.    Labs:  Lab Results  Component Value Date   WBC 5.7 11/26/2017   HGB 13.3 11/26/2017   HCT 42.5 11/26/2017   MCV 76.3 (L) 11/26/2017   PLT 343 11/26/2017    Endometrial Biopsy:  Endometrium, curettage - BENIGN INACTIVE ENDOMETRIUM WITH PROGESTIN-EFFECT. - SCANT BENIGN ENDOCERVICAL MUCOSA. - NEGATIVE FOR HYPERPLASIA OR MALIGNANCY.  Imaging Studies: US Transvaginal Non-OB ULTRASOUND REPORT  Location: [facility] Date of Service:  11/27/2017  Indications: AUB Findings:  The uterus measures 7.8 x 4.9 x 4.1 cm. Echo texture is homogeneous without evidence of focal masses.  The Endometrium measures 3.5 mm.  Right Ovary measures 2.0 x 1.6 x 1.3 cm. It is normal in appearance. Left Ovary measures 2.4 x 1.4 x 1.3 cm. It is normal appearance. Survey of the adnexa demonstrates no adnexal masses. There is no free fluid in the cul de sac.  Impression: 1. Anteverted uterus appears of normal size and contour. 2. Endometrium measures 3.5 mm. 3. Bilateral ovaries appear WNL.  Recommendations: 1.Clinical correlation with the patient's History and Physical Exam.  Dario Ave, RDMS  I have reviewed this study and agree with documented findings.   Rubie Maid, MD [facility]    Assessment:    Abnormal uterine bleeding History of anemia Morbid obesity HTN   Plan:    Counseling: Procedure, risks, reasons, benefits and complications (including injury to bowel, bladder, major blood vessel, ureter, bleeding, possibility of transfusion, infection, or fistula formation) reviewed in detail. Likelihood of success in alleviating the patient's condition was discussed. Routine postoperative instructions will be reviewed with the patient and her family in detail after surgery.  The patient concurred with the proposed plan, giving informed written consent for the surgery.   Preop testing ordered. Instructions reviewed, including NPO after midnight. Encouraged to continue BP meds as recommended.     Rubie Maid, MD [facility]

## 2018-01-23 ENCOUNTER — Inpatient Hospital Stay: Admission: RE | Admit: 2018-01-23 | Payer: 59 | Source: Ambulatory Visit

## 2018-01-26 ENCOUNTER — Encounter: Payer: Self-pay | Admitting: *Deleted

## 2018-01-26 ENCOUNTER — Ambulatory Visit: Payer: 59 | Admitting: Anesthesiology

## 2018-01-26 ENCOUNTER — Encounter
Admission: RE | Disposition: A | Discharge status: Home / Self Care | Payer: Self-pay | Source: Home / Self Care | Attending: Obstetrics and Gynecology

## 2018-01-26 ENCOUNTER — Ambulatory Visit
Admission: RE | Admit: 2018-01-26 | Discharge: 2018-01-26 | Disposition: A | Discharge status: Home / Self Care | Payer: 59 | Attending: Obstetrics and Gynecology | Admitting: Obstetrics and Gynecology

## 2018-01-26 ENCOUNTER — Other Ambulatory Visit: Payer: Self-pay

## 2018-01-26 DIAGNOSIS — F17210 Nicotine dependence, cigarettes, uncomplicated: Secondary | ICD-10-CM | POA: Diagnosis not present

## 2018-01-26 DIAGNOSIS — M797 Fibromyalgia: Secondary | ICD-10-CM | POA: Diagnosis not present

## 2018-01-26 DIAGNOSIS — Z793 Long term (current) use of hormonal contraceptives: Secondary | ICD-10-CM | POA: Insufficient documentation

## 2018-01-26 DIAGNOSIS — Z7982 Long term (current) use of aspirin: Secondary | ICD-10-CM | POA: Insufficient documentation

## 2018-01-26 DIAGNOSIS — Z885 Allergy status to narcotic agent status: Secondary | ICD-10-CM | POA: Diagnosis not present

## 2018-01-26 DIAGNOSIS — Z6841 Body Mass Index (BMI) 40.0 and over, adult: Secondary | ICD-10-CM | POA: Diagnosis not present

## 2018-01-26 DIAGNOSIS — N938 Other specified abnormal uterine and vaginal bleeding: Secondary | ICD-10-CM | POA: Diagnosis not present

## 2018-01-26 DIAGNOSIS — Z87898 Personal history of other specified conditions: Secondary | ICD-10-CM

## 2018-01-26 DIAGNOSIS — N939 Abnormal uterine and vaginal bleeding, unspecified: Secondary | ICD-10-CM | POA: Insufficient documentation

## 2018-01-26 DIAGNOSIS — Z79899 Other long term (current) drug therapy: Secondary | ICD-10-CM | POA: Insufficient documentation

## 2018-01-26 DIAGNOSIS — K219 Gastro-esophageal reflux disease without esophagitis: Secondary | ICD-10-CM | POA: Insufficient documentation

## 2018-01-26 DIAGNOSIS — I10 Essential (primary) hypertension: Secondary | ICD-10-CM | POA: Insufficient documentation

## 2018-01-26 HISTORY — PX: HYSTEROSCOPY WITH D & C: SHX1775

## 2018-01-26 HISTORY — PX: HYSTEROSCOPY W/D&C: SHX1775

## 2018-01-26 LAB — URINE DRUG SCREEN, QUALITATIVE (ARMC ONLY)
AMPHETAMINES, UR SCREEN: NOT DETECTED
Barbiturates, Ur Screen: NOT DETECTED
Benzodiazepine, Ur Scrn: POSITIVE — AB
Cannabinoid 50 Ng, Ur ~~LOC~~: POSITIVE — AB
Cocaine Metabolite,Ur ~~LOC~~: NOT DETECTED
MDMA (ECSTASY) UR SCREEN: NOT DETECTED
METHADONE SCREEN, URINE: NOT DETECTED
Opiate, Ur Screen: NOT DETECTED
Phencyclidine (PCP) Ur S: NOT DETECTED
Tricyclic, Ur Screen: POSITIVE — AB

## 2018-01-26 LAB — CBC
HCT: 46.7 % — ABNORMAL HIGH (ref 36.0–46.0)
Hemoglobin: 14.8 g/dL (ref 12.0–15.0)
MCH: 25.7 pg — ABNORMAL LOW (ref 26.0–34.0)
MCHC: 31.7 g/dL (ref 30.0–36.0)
MCV: 81.2 fL (ref 80.0–100.0)
PLATELETS: 315 10*3/uL (ref 150–400)
RBC: 5.75 MIL/uL — ABNORMAL HIGH (ref 3.87–5.11)
RDW: 18.4 % — ABNORMAL HIGH (ref 11.5–15.5)
WBC: 5.3 10*3/uL (ref 4.0–10.5)
nRBC: 0 % (ref 0.0–0.2)

## 2018-01-26 LAB — POCT PREGNANCY, URINE: Preg Test, Ur: NEGATIVE

## 2018-01-26 SURGERY — DILATATION AND CURETTAGE /HYSTEROSCOPY
Anesthesia: General

## 2018-01-26 MED ORDER — HYDROMORPHONE HCL 1 MG/ML IJ SOLN
INTRAMUSCULAR | Status: AC
  Filled 2018-01-26: qty 0.5

## 2018-01-26 MED ORDER — FENTANYL CITRATE (PF) 100 MCG/2ML IJ SOLN
INTRAMUSCULAR | Status: AC
  Filled 2018-01-26: qty 2

## 2018-01-26 MED ORDER — OXYCODONE HCL 5 MG PO TABS
ORAL_TABLET | ORAL | Status: AC
  Filled 2018-01-26: qty 1

## 2018-01-26 MED ORDER — ROCURONIUM BROMIDE 100 MG/10ML IV SOLN
INTRAVENOUS | Status: DC | PRN
  Administered 2018-01-26: 40 mg via INTRAVENOUS

## 2018-01-26 MED ORDER — HYDROMORPHONE HCL 1 MG/ML IJ SOLN
0.2500 mg | INTRAMUSCULAR | Status: DC | PRN
  Administered 2018-01-26 (×3): 0.25 mg via INTRAVENOUS

## 2018-01-26 MED ORDER — ONDANSETRON HCL 4 MG/2ML IJ SOLN
INTRAMUSCULAR | Status: DC | PRN
  Administered 2018-01-26: 4 mg via INTRAVENOUS

## 2018-01-26 MED ORDER — PROPOFOL 10 MG/ML IV BOLUS
INTRAVENOUS | Status: AC
  Filled 2018-01-26: qty 20

## 2018-01-26 MED ORDER — MIDAZOLAM HCL 2 MG/2ML IJ SOLN
INTRAMUSCULAR | Status: AC
  Filled 2018-01-26: qty 2

## 2018-01-26 MED ORDER — PROPOFOL 10 MG/ML IV BOLUS
INTRAVENOUS | Status: DC | PRN
  Administered 2018-01-26: 200 mg via INTRAVENOUS

## 2018-01-26 MED ORDER — HYDROMORPHONE HCL 1 MG/ML IJ SOLN
INTRAMUSCULAR | Status: AC
  Administered 2018-01-26: 0.5 mg via INTRAVENOUS
  Filled 2018-01-26: qty 1

## 2018-01-26 MED ORDER — PHENYLEPHRINE HCL 10 MG/ML IJ SOLN
INTRAMUSCULAR | Status: DC | PRN
  Administered 2018-01-26 (×2): 100 ug via INTRAVENOUS

## 2018-01-26 MED ORDER — OXYCODONE HCL 5 MG PO TABS
5.0000 mg | ORAL_TABLET | Freq: Once | ORAL | Status: AC
  Administered 2018-01-26: 5 mg via ORAL
  Filled 2018-01-26: qty 1

## 2018-01-26 MED ORDER — PROMETHAZINE HCL 25 MG/ML IJ SOLN: 6.2500 mg | INTRAMUSCULAR | Status: DC | PRN

## 2018-01-26 MED ORDER — FENTANYL CITRATE (PF) 100 MCG/2ML IJ SOLN
INTRAMUSCULAR | Status: DC | PRN
  Administered 2018-01-26: 100 ug via INTRAVENOUS

## 2018-01-26 MED ORDER — FAMOTIDINE 20 MG PO TABS
20.0000 mg | ORAL_TABLET | Freq: Once | ORAL | Status: AC
  Administered 2018-01-26: 20 mg via ORAL
  Filled 2018-01-26: qty 1

## 2018-01-26 MED ORDER — HYDROMORPHONE HCL 1 MG/ML IJ SOLN
INTRAMUSCULAR | Status: AC
  Administered 2018-01-26: 0.25 mg via INTRAVENOUS
  Filled 2018-01-26: qty 0.5

## 2018-01-26 MED ORDER — DEXAMETHASONE SODIUM PHOSPHATE 10 MG/ML IJ SOLN
INTRAMUSCULAR | Status: DC | PRN
  Administered 2018-01-26: 10 mg via INTRAVENOUS

## 2018-01-26 MED ORDER — MIDAZOLAM HCL 2 MG/2ML IJ SOLN
INTRAMUSCULAR | Status: DC | PRN
  Administered 2018-01-26: 2 mg via INTRAVENOUS

## 2018-01-26 MED ORDER — LACTATED RINGERS IV SOLN
INTRAVENOUS | Status: DC
  Administered 2018-01-26 (×2): via INTRAVENOUS

## 2018-01-26 MED ORDER — SUGAMMADEX SODIUM 500 MG/5ML IV SOLN
INTRAVENOUS | Status: DC | PRN
  Administered 2018-01-26: 220 mg via INTRAVENOUS

## 2018-01-26 MED ORDER — LIDOCAINE HCL (CARDIAC) PF 100 MG/5ML IV SOSY
PREFILLED_SYRINGE | INTRAVENOUS | Status: DC | PRN
  Administered 2018-01-26: 100 mg via INTRAVENOUS

## 2018-01-26 MED ORDER — OXYCODONE HCL 5 MG PO TABS: 5.0000 mg | ORAL_TABLET | Freq: Four times a day (QID) | ORAL | 0 refills | Status: DC | PRN

## 2018-01-26 MED ORDER — HYDROMORPHONE HCL 1 MG/ML IJ SOLN
0.5000 mg | INTRAMUSCULAR | Status: DC | PRN
  Administered 2018-01-26 (×2): 0.5 mg via INTRAVENOUS

## 2018-01-26 SURGICAL SUPPLY — 17 items
BAG INFUSER PRESSURE 100CC (MISCELLANEOUS) ×3 IMPLANT
CATH ROBINSON RED A/P 16FR (CATHETERS) ×3 IMPLANT
COVER WAND RF STERILE (DRAPES) ×3 IMPLANT
GLOVE BIO SURGEON STRL SZ 6.5 (GLOVE) ×2 IMPLANT
GLOVE BIO SURGEONS STRL SZ 6.5 (GLOVE) ×1
GLOVE INDICATOR 7.0 STRL GRN (GLOVE) ×3 IMPLANT
GOWN STRL REUS W/ TWL LRG LVL3 (GOWN DISPOSABLE) ×2 IMPLANT
GOWN STRL REUS W/TWL LRG LVL3 (GOWN DISPOSABLE) ×6
HANDPIECE ABLA MINERVA ENDO (MISCELLANEOUS) ×2 IMPLANT
IV LACTATED RINGERS 1000ML (IV SOLUTION) ×3 IMPLANT
KIT TURNOVER CYSTO (KITS) ×3 IMPLANT
PACK DNC HYST (MISCELLANEOUS) ×3 IMPLANT
PAD OB MATERNITY 4.3X12.25 (PERSONAL CARE ITEMS) ×3 IMPLANT
PAD PREP 24X41 OB/GYN DISP (PERSONAL CARE ITEMS) ×3 IMPLANT
SOL PREP PVP 2OZ (MISCELLANEOUS) ×3
SOLUTION PREP PVP 2OZ (MISCELLANEOUS) ×1 IMPLANT
SURGILUBE 2OZ TUBE FLIPTOP (MISCELLANEOUS) ×3 IMPLANT

## 2018-01-26 NOTE — Transfer of Care (Signed)
Immediate Anesthesia Transfer of Care Note  Patient: [name]  Procedure(s) Performed: DILATATION AND CURETTAGE /HYSTEROSCOPY WITH MINERVA (N/A )  Patient Location: PACU  Anesthesia Type:General  Level of Consciousness: awake  Airway & Oxygen Therapy: Patient Spontanous Breathing  Post-op Assessment: Post -op Vital signs reviewed and stable  Post vital signs: stable  Last Vitals:  Vitals Value Taken Time  BP 143/107 01/26/2018 12:42 PM  Temp 37.2 C 01/26/2018 12:42 PM  Pulse 101 01/26/2018 12:45 PM  Resp 15 01/26/2018 12:45 PM  SpO2 97 % 01/26/2018 12:45 PM  Vitals shown include unvalidated device data.  Last Pain:  Vitals:   01/26/18 0931  PainSc: 8          Complications: No apparent anesthesia complications

## 2018-01-26 NOTE — Anesthesia Post-op Follow-up Note (Signed)
Anesthesia QCDR form completed.        

## 2018-01-26 NOTE — Interval H&P Note (Signed)
History and Physical Interval Note:  01/26/2018 10:45 AM  [name]  has presented today for surgery, with the diagnosis of AUB, MORBID OBESITY, HISTORY OF ANEMIA, HYPERTENSION  The various methods of treatment have been discussed with the patient and family. After consideration of risks, benefits and other options for treatment, the patient has consented to  Procedure(s): DILATATION AND CURETTAGE /HYSTEROSCOPY WITH MINERVA (N/A) as a surgical intervention.  The patient's history has been reviewed, patient examined, no change in status, stable for surgery.  I have reviewed the patient's chart and labs.  Pending pre-operative UDS. Questions were answered to the patient's satisfaction.     Rubie Maid, MD [facility]

## 2018-01-26 NOTE — Discharge Instructions (Signed)
Endometrial Ablation, Care After °This sheet gives you information about how to care for yourself after your procedure. Your health care provider may also give you more specific instructions. If you have problems or questions, contact your health care provider. °What can I expect after the procedure? °After the procedure, it is common to have: °· A need to urinate more frequently than usual for the first 24 hours. °· Cramps similar to menstrual cramps. These may last for 1-2 days. °· Thin, watery vaginal discharge that is light pink or brown in color. This may last a few weeks. Discharge will be heavy for the first few days after your procedure. You may need to wear a sanitary pad. °· Nausea. °· Vaginal bleeding for 4-6 weeks after the procedure, as tissue healing occurs. °Follow these instructions at home: °Activity °· Do not drive for 24 hours if you were given amedicine to help you relax (sedative) during your procedure. °· Do not have sex or put anything into your vagina until your health care provider approves. °· Do not lift anything that is heavier than 10 lb (4.5 kg), or the limit that you are told, until your health care provider says that it is safe. °· Return to your normal activities as told by your health care provider. Ask your health care provider what activities are safe for you. °General instructions ° °· Take over-the-counter and prescription medicines only as told by your health care provider. °· Do not take baths, swim, or use a hot tub until your health care provider approves. You will be able to take showers. °· Check your vaginal area every day for signs of infection. Check for: °? Redness, swelling, or pain. °? More discharge or blood, instead of less. °? Bad-smelling discharge. °· Keep all follow-up visits as told by your health care provider. This is important. °· Drink enough fluid to keep your urine pale yellow. °Contact a health care provider if you have: °· Vaginal redness, swelling, or  pain. °· Vaginal discharge or bleeding that gets worse instead of getting better. °· Bad-smelling vaginal discharge. °· A fever or chills. °· Trouble urinating. °Get help right away if you have: °· Heavy vaginal bleeding. °· Severe cramps. °Summary °· After endometrial ablation, it is normal to have thin, watery vaginal discharge that is light pink or brown in color. This may last a few weeks and may be heavier right after the procedure. °· Vaginal bleeding is also normal after the procedure and should get better with time. °· Check your vaginal area every day for signs of infection, such as bad-smelling discharge. °· Keep all follow-up visits as told by your health care provider. This is important. °This information is not intended to replace advice given to you by your health care provider. Make sure you discuss any questions you have with your health care provider. °Document Released: 11/19/2016 Document Revised: 11/19/2016 Document Reviewed: 11/19/2016 °Elsevier Interactive Patient Education © 2019 [employer] ° ° °AMBULATORY SURGERY  °DISCHARGE INSTRUCTIONS ° ° °1) The drugs that you were given will stay in your system until tomorrow so for the next 24 hours you should not: ° °A) Drive an automobile °B) Make any legal decisions °C) Drink any alcoholic beverage ° ° °2) You may resume regular meals tomorrow.  Today it is better to start with liquids and gradually work up to solid foods. ° °You may eat anything you prefer, but it is better to start with liquids, then soup and crackers, and gradually   work up to solid foods. ° ° °3) Please notify your doctor immediately if you have any unusual bleeding, trouble breathing, redness and pain at the surgery site, drainage, fever, or pain not relieved by medication. ° ° ° °4) Additional Instructions: ° ° ° ° ° ° ° °Please contact your physician with any problems or Same Day Surgery at [phone], Monday through Friday 6 am to 4 pm, or [place] [facility] [place] [facility]  number at [phone]. ° °

## 2018-01-26 NOTE — Op Note (Signed)
Procedure(s): DILATATION AND CURETTAGE /HYSTEROSCOPY WITH MINERVA Procedure Note  [name] female 43 y.o. 01/26/2018  Indications: The patient is a 43 y.o. [id number] female with abnormal uterine bleeding, morbid obesity, history of anemia  Pre-operative Diagnosis: Same  Post-operative Diagnosis: Same  Surgeon: Rubie Maid, MD  Assistants: None  Anesthesia: General endotracheal anesthesia  Procedure Details: The patient was seen in the Holding Room. The risks, benefits, complications, treatment options, and expected outcomes were discussed with the patient.  The patient concurred with the proposed plan, giving informed consent.  The site of surgery properly noted/marked. The patient was taken to the Operating Room, identified as [name] and the procedure verified as Procedure(s) (LRB): DILATATION AND CURETTAGE /HYSTEROSCOPY WITH MINERVA (N/A). A Time Out was held and the above information confirmed.  She was then placed under general anesthesia without difficulty. She was placed in the dorsal lithotomy position, and was prepped and draped in a sterile manner. Patient voided prior to procedure. A sterile speculum was inserted into the vagina and the cervix was grasped at the anterior lip using a single-toothed tenaculum.  The uterus was sounded to 9.5 cm, with cervical length appreciated at 4 cm.  A Hulka clamp was placed for uterine manipulation. A single-tooth tenaculum was used to grasp the anterior lip of the cervix. Cervical dilation was performed. A 5 mm hysteroscope was introduced into the uterus under direct visualization. The cavity was allowed to fill, and then the entire cavity was explored with the findings described above. The hysteroscope was removed, and a sharp curette was then passed into the uterus and endometrial sampling was collected for pathology.   Next the Minerva instrument was primed per instructions. The instrument was then placed into the endometrial  canal and activated. The Minerva instrument was then removed from the uterine cavity.  The hysteroscope was then re-introduced for final survey, with adequate charring of the endometrium noted.  The hysteroscope was removed from the patient's uterine cavity. The tenaculum was removed and excellent hemostasis was noted. The speculum was removed from the vagina.   All instrument and sponge counts were correct at the end of the procedure x 2.  The patient tolerated the procedure well.  She was awakened and taken to the PACU in stable condition.   Findings: Uterus sounded to 9.5 cm.  Cervical length 4 cm.  Proliferative endometrium.  Tubal ostia were not visualized bilaterally.  No intrauterine masses.  Adequate charring of endometrial tissue post ablation.  No perforations noted.   Estimated Blood Loss:  minimal      Drains: None         Total IV Fluids:  1000 ml  Fluid deficit: 100 ml  Specimens:  Endometrial curettings         Implants: None         Complications:  None; patient tolerated the procedure well.         Disposition: PACU - hemodynamically stable.         Condition: stable   Rubie Maid, MD [facility]

## 2018-01-26 NOTE — Anesthesia Procedure Notes (Signed)
Procedure Name: Intubation Date/Time: 01/26/2018 11:46 AM Performed by: Aline Brochure, CRNA Pre-anesthesia Checklist: Patient identified, Emergency Drugs available, Suction available and Patient being monitored Patient Re-evaluated:Patient Re-evaluated prior to induction Oxygen Delivery Method: Circle system utilized Preoxygenation: Pre-oxygenation with 100% oxygen Induction Type: IV induction Ventilation: Mask ventilation without difficulty Laryngoscope Size: Mac and 3 Grade View: Grade I Tube type: Oral Tube size: 7.0 mm Number of attempts: 1 Airway Equipment and Method: Stylet Placement Confirmation: ETT inserted through vocal cords under direct vision,  positive ETCO2 and breath sounds checked- equal and bilateral Secured at: 22 cm Tube secured with: Tape Dental Injury: Teeth and Oropharynx as per pre-operative assessment

## 2018-01-26 NOTE — Anesthesia Preprocedure Evaluation (Addendum)
Anesthesia Evaluation  Patient identified by MRN, date of birth, ID band Patient awake    Reviewed: Allergy & Precautions, H&P , NPO status , Patient's Chart, lab work & pertinent test results  Airway Mallampati: III  TM Distance: >3 FB     Dental  (+) Upper Dentures, Lower Dentures   Pulmonary neg pulmonary ROS, Current Smoker,           Cardiovascular hypertension, negative cardio ROS       Neuro/Psych PSYCHIATRIC DISORDERS Bipolar Disorder negative neurological ROS     GI/Hepatic Neg liver ROS, GERD  Controlled,  Endo/Other  Hyperthyroidism   Renal/GU      Musculoskeletal  (+) Arthritis , Fibromyalgia -  Abdominal   Peds  Hematology  (+) Blood dyscrasia, anemia ,   Anesthesia Other Findings Past Medical History: No date: Anemia No date: Bipolar 1 disorder ([facility]) No date: Fibromyalgia No date: GERD (gastroesophageal reflux disease) No date: History of degenerative disc disease No date: Hypertension No date: Manic depression ([facility]) No date: osteoarthritis     Comment:  osteoarthritis No date: Sciatica  Past Surgical History: No date: ANKLE SURGERY; Left     Comment:  ORIF No date: BACK SURGERY     Comment:  x3 1998: CHOLECYSTECTOMY     Comment:  Dr. Anthony Sar 2005: ERCP     Comment:  Dr. Laural Golden: s/p sphincerotomy with removal of sludge and              debris 08/16/2015: LUMBAR WOUND DEBRIDEMENT; N/A     Comment:  Procedure: Spinal Cord Stimulator Revision;  Surgeon:               Ashok Pall, MD;  Location: [place] [facility];  Service:               Neurosurgery;  Laterality: N/A; 08/16/2015: SPINAL CORD STIMULATOR INSERTION; N/A     Comment:  Procedure: LUMBAR SPINAL CORD STIMULATOR INSERTION;                Surgeon: Ashok Pall, MD;  Location: [place] [facility];                Service: Neurosurgery;  Laterality: N/A;  LUMBAR SPINAL               CORD STIMULATOR INSERTION No date: TUBAL LIGATION 6962:  UMBILICAL HERNIA REPAIR     Comment:  [facility]     Reproductive/Obstetrics negative OB ROS                           Anesthesia Physical Anesthesia Plan  ASA: III  Anesthesia Plan: General ETT   Post-op Pain Management:    Induction:   PONV Risk Score and Plan: Dexamethasone, Ondansetron, Midazolam and Treatment may vary due to age or medical condition  Airway Management Planned:   Additional Equipment:   Intra-op Plan:   Post-operative Plan:   Informed Consent: I have reviewed the patients History and Physical, chart, labs and discussed the procedure including the risks, benefits and alternatives for the proposed anesthesia with the patient or authorized representative who has indicated his/her understanding and acceptance.   Dental Advisory Given  Plan Discussed with: Anesthesiologist, CRNA and Surgeon  Anesthesia Plan Comments:        Anesthesia Quick Evaluation

## 2018-01-27 LAB — SURGICAL PATHOLOGY

## 2018-01-27 NOTE — Anesthesia Postprocedure Evaluation (Signed)
Anesthesia Post Note  Patient: [name]  Procedure(s) Performed: DILATATION AND CURETTAGE /HYSTEROSCOPY WITH MINERVA (N/A )  Patient location during evaluation: PACU Anesthesia Type: General Level of consciousness: awake and alert Pain management: pain level controlled Vital Signs Assessment: post-procedure vital signs reviewed and stable Respiratory status: spontaneous breathing, nonlabored ventilation, respiratory function stable and patient connected to nasal cannula oxygen Cardiovascular status: blood pressure returned to baseline and stable Postop Assessment: no apparent nausea or vomiting Anesthetic complications: no     Last Vitals:  Vitals:   01/26/18 1412 01/26/18 1445  BP: (!) 153/83 (!) 142/82  Pulse: 82 82  Resp: 18 18  Temp: 36.4 C (!) 36.4 C  SpO2: 99% 100%    Last Pain:  Vitals:   01/26/18 1445  TempSrc: Temporal  PainSc: 3                  Durenda Hurt

## 2018-02-04 ENCOUNTER — Ambulatory Visit (INDEPENDENT_AMBULATORY_CARE_PROVIDER_SITE_OTHER): Payer: 59 | Admitting: Obstetrics and Gynecology

## 2018-02-04 ENCOUNTER — Encounter: Payer: Self-pay | Admitting: Obstetrics and Gynecology

## 2018-02-04 VITALS — BP 125/87 | HR 106 | Ht 60.0 in | Wt 243.6 lb

## 2018-02-04 DIAGNOSIS — Z4889 Encounter for other specified surgical aftercare: Secondary | ICD-10-CM

## 2018-02-04 DIAGNOSIS — Z9889 Other specified postprocedural states: Secondary | ICD-10-CM

## 2018-02-04 DIAGNOSIS — Z1231 Encounter for screening mammogram for malignant neoplasm of breast: Secondary | ICD-10-CM

## 2018-02-04 NOTE — Progress Notes (Signed)
    OBSTETRICS/GYNECOLOGY POST-OPERATIVE CLINIC VISIT  Subjective:     [name] is a 43 y.o. [id number] female who presents to the clinic 1 weeks status post Hysteroscopy D&C with endometrial ablation (Minerva) for abnormal uterine bleeding. Eating a regular diet without difficulty. Bowel movements are normal. The patient is not having any pain.  Notes light watery brownish discharge but no bleeding.   The following portions of the patient's history were reviewed and updated as appropriate: allergies, current medications, past family history, past medical history, past social history, past surgical history and problem list.  Review of Systems Pertinent items noted in HPI and remainder of comprehensive ROS otherwise negative.    Objective:    BP 125/87   Pulse (!) 106   Ht 5' (1.524 m)   Wt 243 lb 9.6 oz (110.5 kg)   BMI 47.57 kg/m  General:  alert and no distress  Abdomen: soft, bowel sounds active, non-tender  Pelvis:   external genitalia normal, rectovaginal septum normal.  Vagina without discharge.  Cervix normal appearing, no lesions.     Pathology:   A. ENDOMETRIAL CURETTINGS:  - WEAKLY PROLIFERATIVE ENDOMETRIUM WITH STROMAL HEMORRHAGE.  - NEGATIVE FOR ATYPIA, EIN, AND MALIGNANCY.   Assessment:    Doing well postoperatively.  S/p endometrial ablation  Plan:   1. Continue any current medications as needed. 2. Wound care discussed. 3. Operative findings again reviewed. Pathology report discussed. 4. Activity restrictions: pelvic rest and no sit-down baths for 1 week.  5. Anticipated return to work: not applicable. 6. Follow up: 4-6 months for annual exam.  Patient is overdue for a mammogram (has never had one).  Will schedule.      Rubie Maid, MD [facility]

## 2018-02-04 NOTE — Progress Notes (Signed)
Pt stated that she is doing well. No problems.

## 2018-02-19 ENCOUNTER — Encounter: Payer: 59 | Admitting: Obstetrics and Gynecology

## 2018-03-30 ENCOUNTER — Inpatient Hospital Stay: Payer: 59

## 2018-03-30 ENCOUNTER — Encounter: Payer: Self-pay | Admitting: Oncology

## 2018-03-30 ENCOUNTER — Inpatient Hospital Stay: Payer: 59 | Attending: Oncology | Admitting: Oncology

## 2018-03-30 ENCOUNTER — Other Ambulatory Visit: Payer: Self-pay

## 2018-03-30 ENCOUNTER — Encounter (INDEPENDENT_AMBULATORY_CARE_PROVIDER_SITE_OTHER): Payer: Self-pay

## 2018-03-30 VITALS — BP 131/88 | HR 89 | Temp 97.1°F | Resp 18 | Wt 256.2 lb

## 2018-03-30 DIAGNOSIS — I10 Essential (primary) hypertension: Secondary | ICD-10-CM | POA: Diagnosis not present

## 2018-03-30 DIAGNOSIS — D500 Iron deficiency anemia secondary to blood loss (chronic): Secondary | ICD-10-CM

## 2018-03-30 DIAGNOSIS — F1210 Cannabis abuse, uncomplicated: Secondary | ICD-10-CM

## 2018-03-30 DIAGNOSIS — R5383 Other fatigue: Secondary | ICD-10-CM

## 2018-03-30 DIAGNOSIS — F319 Bipolar disorder, unspecified: Secondary | ICD-10-CM

## 2018-03-30 DIAGNOSIS — Z79899 Other long term (current) drug therapy: Secondary | ICD-10-CM

## 2018-03-30 DIAGNOSIS — D509 Iron deficiency anemia, unspecified: Secondary | ICD-10-CM | POA: Diagnosis present

## 2018-03-30 DIAGNOSIS — Z7982 Long term (current) use of aspirin: Secondary | ICD-10-CM | POA: Insufficient documentation

## 2018-03-30 DIAGNOSIS — F5089 Other specified eating disorder: Secondary | ICD-10-CM | POA: Insufficient documentation

## 2018-03-30 DIAGNOSIS — R7989 Other specified abnormal findings of blood chemistry: Secondary | ICD-10-CM

## 2018-03-30 DIAGNOSIS — F17210 Nicotine dependence, cigarettes, uncomplicated: Secondary | ICD-10-CM

## 2018-03-30 DIAGNOSIS — N938 Other specified abnormal uterine and vaginal bleeding: Secondary | ICD-10-CM | POA: Diagnosis not present

## 2018-03-30 DIAGNOSIS — N920 Excessive and frequent menstruation with regular cycle: Secondary | ICD-10-CM | POA: Diagnosis not present

## 2018-03-30 DIAGNOSIS — M797 Fibromyalgia: Secondary | ICD-10-CM

## 2018-03-30 DIAGNOSIS — K219 Gastro-esophageal reflux disease without esophagitis: Secondary | ICD-10-CM | POA: Diagnosis not present

## 2018-03-30 DIAGNOSIS — M1990 Unspecified osteoarthritis, unspecified site: Secondary | ICD-10-CM | POA: Insufficient documentation

## 2018-03-30 LAB — CBC WITH DIFFERENTIAL/PLATELET
Abs Immature Granulocytes: 0.01 10*3/uL (ref 0.00–0.07)
BASOS ABS: 0 10*3/uL (ref 0.0–0.1)
Basophils Relative: 1 %
Eosinophils Absolute: 0.2 10*3/uL (ref 0.0–0.5)
Eosinophils Relative: 4 %
HCT: 41.4 % (ref 36.0–46.0)
Hemoglobin: 13.5 g/dL (ref 12.0–15.0)
Immature Granulocytes: 0 %
Lymphocytes Relative: 42 %
Lymphs Abs: 2.1 10*3/uL (ref 0.7–4.0)
MCH: 26.2 pg (ref 26.0–34.0)
MCHC: 32.6 g/dL (ref 30.0–36.0)
MCV: 80.4 fL (ref 80.0–100.0)
Monocytes Absolute: 0.4 10*3/uL (ref 0.1–1.0)
Monocytes Relative: 7 %
NRBC: 0 % (ref 0.0–0.2)
Neutro Abs: 2.4 10*3/uL (ref 1.7–7.7)
Neutrophils Relative %: 46 %
Platelets: 287 10*3/uL (ref 150–400)
RBC: 5.15 MIL/uL — AB (ref 3.87–5.11)
RDW: 15.5 % (ref 11.5–15.5)
WBC: 5.1 10*3/uL (ref 4.0–10.5)

## 2018-03-30 LAB — IRON AND TIBC
Iron: 36 ug/dL (ref 28–170)
Saturation Ratios: 11 % (ref 10.4–31.8)
TIBC: 324 ug/dL (ref 250–450)
UIBC: 288 ug/dL

## 2018-03-30 LAB — FERRITIN: Ferritin: 52 ng/mL (ref 11–307)

## 2018-03-30 MED ORDER — IRON-VITAMIN C 65-125 MG PO TABS: 1.0000 | ORAL_TABLET | Freq: Every day | ORAL | 5 refills | Status: DC

## 2018-03-30 NOTE — Progress Notes (Signed)
Hematology/Oncology follow up note [facility] Telephone:(336) [phone] Fax:(336) [phone]   Patient Care Team: Remi Haggard, FNP as PCP - General (Family Medicine) Gala Romney Cristopher Estimable, MD as Consulting Physician (Gastroenterology)  REFERRING PROVIDER: Remi Haggard, FNP REASON FOR VISIT Follow up for treatment of anemia.   HISTORY OF PRESENTING ILLNESS:  [name] is a  43 y.o.  female with PMH listed below who was referred to me for evaluation of anemia. Most recent cbc showed hemoglobin 10, MCV 66.4, platelet 447.   History of iron deficiency: Denies. Rectal bleeding: denies Menstrual bleeding/ Vaginal bleeding : heavy menstrual period, uses a whole bag of pads for the first 3 days of period.  Hematemesis or hemoptysis : none  Blood in urine : none  Pica: craves for ice chips.  Last endoscopy: never.  Fatigue: yes.  INTERVAL HISTORY [name] is a 43 y.o. female who has above history reviewed by me today present for follow-up for management of iron deficiency anemia. During the interval, patient has had D&C and a hysteroscopy with minerva.  Patient had light bleeding for about a week after the procedure.  Vaginal bleeding stopped since then. Reports feeling better. Fatigue has improved. Review of Systems  Constitutional: Negative for chills, fever, malaise/fatigue and weight loss.  HENT: Negative for congestion, ear discharge, ear pain, nosebleeds, sinus pain and sore throat.   Eyes: Negative for double vision, photophobia, pain, discharge and redness.  Respiratory: Negative for cough, hemoptysis, sputum production, shortness of breath and wheezing.   Cardiovascular: Negative for chest pain, palpitations, orthopnea, claudication and leg swelling.  Gastrointestinal: Negative for abdominal pain, blood in stool, constipation, diarrhea, heartburn, melena, nausea and vomiting.  Genitourinary: Negative for dysuria, flank pain, frequency and  hematuria.  Musculoskeletal: Negative for back pain, myalgias and neck pain.  Skin: Negative for itching and rash.  Neurological: Negative for dizziness, tingling, tremors, focal weakness, weakness and headaches.  Endo/Heme/Allergies: Negative for environmental allergies. Does not bruise/bleed easily.  Psychiatric/Behavioral: Negative for depression and hallucinations. The patient is not nervous/anxious.     MEDICAL HISTORY:  Past Medical History:  Diagnosis Date  . Anemia   . Bipolar 1 disorder ([place])   . Fibromyalgia   . GERD (gastroesophageal reflux disease)   . History of degenerative disc disease   . Hypertension   . Manic depression ([place])   . osteoarthritis    osteoarthritis  . Sciatica     SURGICAL HISTORY: Past Surgical History:  Procedure Laterality Date  . ANKLE SURGERY Left    ORIF  . BACK SURGERY     x3  . CHOLECYSTECTOMY  1998   Dr. Anthony Sar  . ERCP  2005   Dr. Laural Golden: s/p sphincerotomy with removal of sludge and debris  . HYSTEROSCOPY W/D&C N/A 01/26/2018   Procedure: DILATATION AND CURETTAGE /HYSTEROSCOPY WITH MINERVA;  Surgeon: Rubie Maid, MD;  Location: [facility] ORS;  Service: Gynecology;  Laterality: N/A;  . LUMBAR WOUND DEBRIDEMENT N/A 08/16/2015   Procedure: Spinal Cord Stimulator Revision;  Surgeon: Ashok Pall, MD;  Location: [place] NEURO ORS;  Service: Neurosurgery;  Laterality: N/A;  . SPINAL CORD STIMULATOR INSERTION N/A 08/16/2015   Procedure: LUMBAR SPINAL CORD STIMULATOR INSERTION;  Surgeon: Ashok Pall, MD;  Location: [place] NEURO ORS;  Service: Neurosurgery;  Laterality: N/A;  LUMBAR SPINAL CORD STIMULATOR INSERTION  . TUBAL LIGATION    . UMBILICAL HERNIA REPAIR  1995   [facility]    SOCIAL HISTORY: Social History  Socioeconomic History  . Marital status: Married    Spouse name: Not on file  . Number of children: 2  . Years of education: Not on file  . Highest education level: Not on file  Occupational History  . Occupation: disabled    Social Needs  . Financial resource strain: Not on file  . Food insecurity:    Worry: Not on file    Inability: Not on file  . Transportation needs:    Medical: Not on file    Non-medical: Not on file  Tobacco Use  . Smoking status: Current Every Day Smoker    Packs/day: 1.00    Years: 20.00    Pack years: 20.00    Types: Cigarettes  . Smokeless tobacco: Never Used  Substance and Sexual Activity  . Alcohol use: No  . Drug use: Yes    Types: Marijuana    Comment: none since 07/2016  . Sexual activity: Yes    Birth control/protection: Surgical  Lifestyle  . Physical activity:    Days per week: Not on file    Minutes per session: Not on file  . Stress: Not on file  Relationships  . Social connections:    Talks on phone: Not on file    Gets together: Not on file    Attends religious service: Not on file    Active member of club or organization: Not on file    Attends meetings of clubs or organizations: Not on file    Relationship status: Not on file  . Intimate partner violence:    Fear of current or ex partner: Not on file    Emotionally abused: Not on file    Physically abused: Not on file    Forced sexual activity: Not on file  Other Topics Concern  . Not on file  Social History Narrative  . Not on file    FAMILY HISTORY: Family History  Problem Relation Age of Onset  . Crohn's disease Maternal Aunt   . Rheum arthritis Mother   . Diabetes Mother   . Clotting disorder Mother   . Breast cancer Mother   . Cancer Mother 75       breast (may have been precancerous)  . Colon cancer Neg Hx     ALLERGIES:  is allergic to vicodin [hydrocodone-acetaminophen]; acetaminophen; hydrocil [psyllium]; and percocet [oxycodone-acetaminophen].  MEDICATIONS:  Current Outpatient Medications  Medication Sig Dispense Refill  . ALPRAZolam (XANAX) 1 MG tablet Take 1 mg by mouth 3 (three) times daily as needed for anxiety.    Marland Kitchen amitriptyline (ELAVIL) 150 MG tablet Take 1 tablet  by mouth at bedtime.    . Aspirin-Acetaminophen-Caffeine (EXCEDRIN PO) Take 1-2 tablets by mouth 2 (two) times daily as needed (headaches).     . baclofen (LIORESAL) 10 MG tablet     . Buprenorphine HCl-Naloxone HCl (SUBOXONE) 8-2 MG FILM Place 1 Film under the tongue 3 (three) times daily.    Marland Kitchen DEXILANT 60 MG capsule Take 60 mg by mouth daily.    Marland Kitchen gabapentin (NEURONTIN) 600 MG tablet Take 600 mg by mouth 2 (two) times daily.     Marland Kitchen lisinopril-hydrochlorothiazide (PRINZIDE,ZESTORETIC) 20-25 MG tablet Take 1 tablet by mouth daily.    Marland Kitchen umeclidinium-vilanterol (ANORO ELLIPTA) 62.5-25 MCG/INH AEPB Inhale 1 puff into the lungs daily.    . varenicline (CHANTIX) 0.5 MG tablet Take 1 tablet (0.5 mg total) by mouth 2 (two) times daily. 60 tablet 0   No current facility-administered medications for  this visit.    Facility-Administered Medications Ordered in Other Visits  Medication Dose Route Frequency Provider Last Rate Last Dose  . ondansetron (ZOFRAN) 4 mg in sodium chloride 0.9 % 50 mL IVPB  4 mg Intravenous Q6H PRN Ashok Pall, MD         PHYSICAL EXAMINATION: ECOG PERFORMANCE STATUS: 0 - Asymptomatic Vitals:   03/30/18 1442  BP: 131/88  Pulse: 89  Resp: 18  Temp: (!) 97.1 F (36.2 C)   Filed Weights   03/30/18 1442  Weight: 256 lb 3.2 oz (116.2 kg)    Physical Exam Constitutional:      General: She is not in acute distress.    Appearance: She is obese.     Comments: Obese  HENT:     Head: Normocephalic and atraumatic.  Eyes:     General: No scleral icterus.    Pupils: Pupils are equal, round, and reactive to light.  Neck:     Musculoskeletal: Normal range of motion and neck supple.  Cardiovascular:     Rate and Rhythm: Normal rate and regular rhythm.     Heart sounds: Normal heart sounds.  Pulmonary:     Effort: Pulmonary effort is normal. No respiratory distress.     Breath sounds: Wheezing present.  Abdominal:     General: Bowel sounds are normal. There is no  distension.     Palpations: Abdomen is soft. There is no mass.     Tenderness: There is no abdominal tenderness.  Musculoskeletal: Normal range of motion.        General: No deformity.  Lymphadenopathy:     Cervical: No cervical adenopathy.  Skin:    General: Skin is warm and dry.     Findings: No erythema or rash.  Neurological:     Mental Status: She is alert and oriented to person, place, and time.     Cranial Nerves: No cranial nerve deficit.     Coordination: Coordination normal.  Psychiatric:        Behavior: Behavior normal.        Thought Content: Thought content normal.      LABORATORY DATA:  I have reviewed the data as listed Lab Results  Component Value Date   WBC 5.1 03/30/2018   HGB 13.5 03/30/2018   HCT 41.4 03/30/2018   MCV 80.4 03/30/2018   PLT 287 03/30/2018   Recent Labs    05/10/17 1738 10/07/17 1550  NA 137 141  K 3.4* 3.4*  CL 104 111  CO2 22 22  GLUCOSE 123* 118*  BUN 8 9  CREATININE 0.64 0.70  CALCIUM 8.9 8.7*  GFRNONAA >60 >60  GFRAA >60 >60  PROT 7.1 7.3  ALBUMIN 3.4* 3.7  AST 18 16  ALT 14 15  ALKPHOS 72 61  BILITOT 0.4 0.7       ASSESSMENT & PLAN:  1. Iron deficiency anemia, unspecified iron deficiency anemia type   2. DUB (dysfunctional uterine bleeding)    Labs are reviewed and discussed with patient.  Hemoglobin 13.5.  MCV normalized to 80. Iron panel showed increased ferritin to 52, saturation ratio 11. Hold additional IV iron for now. Recommend patient to have oral iron supplementation.  Prescription sent to pharmacy. Recommend repeat CBC, TIBC, ferritin, in 6 months for reassessment.  Return of visit: 6 months.  We spent sufficient time to discuss many aspect of care, questions were answered to patient's satisfaction. Total face to face encounter time for this patient visit was 15  min. >50% of the time was  spent in counseling and coordination of care.   Earlie Server, MD, PhD Hematology Oncology [facility] Pager- [phone] 03/30/2018

## 2018-03-30 NOTE — Progress Notes (Signed)
Patient here for follow up. Pt states she has gained weight since her appetite has gotten better. She tends to eat more at night. Pt also attributes weight gain to fluid build up. Pt with lack of energy.

## 2018-05-13 ENCOUNTER — Other Ambulatory Visit: Payer: Self-pay | Admitting: Family Medicine

## 2018-05-13 DIAGNOSIS — Z1231 Encounter for screening mammogram for malignant neoplasm of breast: Secondary | ICD-10-CM

## 2018-05-28 ENCOUNTER — Encounter: Payer: 59 | Admitting: Obstetrics and Gynecology

## 2018-06-05 ENCOUNTER — Encounter: Payer: 59 | Admitting: Obstetrics and Gynecology

## 2018-08-05 ENCOUNTER — Encounter: Payer: 59 | Admitting: Obstetrics and Gynecology

## 2018-08-28 ENCOUNTER — Ambulatory Visit
Admission: RE | Admit: 2018-08-28 | Discharge: 2018-08-28 | Disposition: A | Discharge status: Home / Self Care | Payer: 59 | Source: Ambulatory Visit | Attending: Family Medicine | Admitting: Family Medicine

## 2018-08-28 DIAGNOSIS — Z1231 Encounter for screening mammogram for malignant neoplasm of breast: Secondary | ICD-10-CM | POA: Diagnosis present

## 2018-09-07 ENCOUNTER — Other Ambulatory Visit: Payer: Self-pay | Admitting: Family Medicine

## 2018-09-07 DIAGNOSIS — R928 Other abnormal and inconclusive findings on diagnostic imaging of breast: Secondary | ICD-10-CM

## 2018-09-07 DIAGNOSIS — N6489 Other specified disorders of breast: Secondary | ICD-10-CM

## 2018-09-21 ENCOUNTER — Other Ambulatory Visit: Payer: 59

## 2018-09-21 ENCOUNTER — Ambulatory Visit: Payer: 59 | Attending: Family Medicine

## 2018-09-25 NOTE — Progress Notes (Deleted)
Tried calling patient, unable to leave message voicemail has not been set up

## 2018-09-29 ENCOUNTER — Encounter: Payer: Self-pay | Admitting: Oncology

## 2018-09-29 ENCOUNTER — Ambulatory Visit: Payer: 59

## 2018-09-29 ENCOUNTER — Other Ambulatory Visit: Payer: 59

## 2018-09-29 ENCOUNTER — Inpatient Hospital Stay: Payer: 59

## 2018-09-29 ENCOUNTER — Inpatient Hospital Stay: Payer: 59 | Admitting: Oncology

## 2018-10-08 ENCOUNTER — Other Ambulatory Visit: Payer: 59

## 2018-10-28 ENCOUNTER — Encounter: Payer: 59 | Admitting: Obstetrics and Gynecology

## 2018-11-17 ENCOUNTER — Other Ambulatory Visit: Payer: Self-pay | Admitting: Sports Medicine

## 2018-11-17 DIAGNOSIS — M25561 Pain in right knee: Secondary | ICD-10-CM

## 2018-11-26 ENCOUNTER — Other Ambulatory Visit: Payer: Self-pay

## 2018-11-26 DIAGNOSIS — Z20822 Contact with and (suspected) exposure to covid-19: Secondary | ICD-10-CM

## 2018-11-27 LAB — NOVEL CORONAVIRUS, NAA: SARS-CoV-2, NAA: NOT DETECTED

## 2018-12-01 ENCOUNTER — Telehealth: Payer: Self-pay | Admitting: *Deleted

## 2018-12-01 NOTE — Telephone Encounter (Signed)
She called in regarding her COVID-19 test result.   I let her know she did not have the virus.

## 2018-12-02 ENCOUNTER — Other Ambulatory Visit: Payer: 59

## 2018-12-09 ENCOUNTER — Other Ambulatory Visit: Payer: 59

## 2018-12-10 ENCOUNTER — Ambulatory Visit
Admission: RE | Admit: 2018-12-10 | Discharge: 2018-12-10 | Disposition: A | Discharge status: Home / Self Care | Payer: 59 | Source: Ambulatory Visit | Attending: Sports Medicine | Admitting: Sports Medicine

## 2018-12-10 DIAGNOSIS — M25561 Pain in right knee: Secondary | ICD-10-CM

## 2018-12-21 ENCOUNTER — Other Ambulatory Visit: Payer: 59

## 2018-12-21 ENCOUNTER — Other Ambulatory Visit: Payer: Self-pay

## 2018-12-21 ENCOUNTER — Inpatient Hospital Stay: Payer: 59 | Attending: Oncology

## 2018-12-21 DIAGNOSIS — D509 Iron deficiency anemia, unspecified: Secondary | ICD-10-CM | POA: Diagnosis present

## 2018-12-21 LAB — CBC WITH DIFFERENTIAL/PLATELET
Abs Immature Granulocytes: 0.02 10*3/uL (ref 0.00–0.07)
Basophils Absolute: 0.1 10*3/uL (ref 0.0–0.1)
Basophils Relative: 1 %
Eosinophils Absolute: 0.3 10*3/uL (ref 0.0–0.5)
Eosinophils Relative: 5 %
HCT: 41.3 % (ref 36.0–46.0)
Hemoglobin: 13 g/dL (ref 12.0–15.0)
Immature Granulocytes: 0 %
Lymphocytes Relative: 37 %
Lymphs Abs: 2.1 10*3/uL (ref 0.7–4.0)
MCH: 25.8 pg — ABNORMAL LOW (ref 26.0–34.0)
MCHC: 31.5 g/dL (ref 30.0–36.0)
MCV: 81.9 fL (ref 80.0–100.0)
Monocytes Absolute: 0.4 10*3/uL (ref 0.1–1.0)
Monocytes Relative: 8 %
Neutro Abs: 2.8 10*3/uL (ref 1.7–7.7)
Neutrophils Relative %: 49 %
Platelets: 336 10*3/uL (ref 150–400)
RBC: 5.04 MIL/uL (ref 3.87–5.11)
RDW: 15.2 % (ref 11.5–15.5)
WBC: 5.6 10*3/uL (ref 4.0–10.5)
nRBC: 0 % (ref 0.0–0.2)

## 2018-12-21 LAB — FERRITIN: Ferritin: 51 ng/mL (ref 11–307)

## 2018-12-21 LAB — IRON AND TIBC
Iron: 71 ug/dL (ref 28–170)
Saturation Ratios: 24 % (ref 10.4–31.8)
TIBC: 303 ug/dL (ref 250–450)
UIBC: 232 ug/dL

## 2018-12-22 ENCOUNTER — Other Ambulatory Visit: Payer: Self-pay

## 2018-12-22 ENCOUNTER — Encounter: Payer: Self-pay | Admitting: Oncology

## 2018-12-22 ENCOUNTER — Inpatient Hospital Stay: Payer: 59

## 2018-12-22 ENCOUNTER — Inpatient Hospital Stay: Payer: 59 | Attending: Oncology | Admitting: Oncology

## 2018-12-22 VITALS — BP 119/76 | HR 85 | Temp 97.4°F | Resp 18 | Wt 250.2 lb

## 2018-12-22 DIAGNOSIS — I10 Essential (primary) hypertension: Secondary | ICD-10-CM | POA: Insufficient documentation

## 2018-12-22 DIAGNOSIS — D509 Iron deficiency anemia, unspecified: Secondary | ICD-10-CM | POA: Diagnosis not present

## 2018-12-22 DIAGNOSIS — Z79899 Other long term (current) drug therapy: Secondary | ICD-10-CM | POA: Insufficient documentation

## 2018-12-22 DIAGNOSIS — Z7982 Long term (current) use of aspirin: Secondary | ICD-10-CM | POA: Insufficient documentation

## 2018-12-22 DIAGNOSIS — F319 Bipolar disorder, unspecified: Secondary | ICD-10-CM | POA: Insufficient documentation

## 2018-12-22 DIAGNOSIS — F17210 Nicotine dependence, cigarettes, uncomplicated: Secondary | ICD-10-CM | POA: Insufficient documentation

## 2018-12-22 DIAGNOSIS — Z803 Family history of malignant neoplasm of breast: Secondary | ICD-10-CM | POA: Diagnosis not present

## 2018-12-22 NOTE — Progress Notes (Signed)
Patient has been having knee pain that is being worked up by ortho.  Also newly diagnosed with Crohn's disease.

## 2018-12-22 NOTE — Progress Notes (Signed)
Hematology/Oncology follow up note [facility] Telephone:(336) [phone] Fax:(336) [phone]   Patient Care Team: Remi Haggard, FNP as PCP - General (Family Medicine) Gala Romney Cristopher Estimable, MD as Consulting Physician (Gastroenterology)  REFERRING PROVIDER: Remi Haggard, FNP REASON FOR VISIT Follow up for treatment of anemia.   HISTORY OF PRESENTING ILLNESS:  [name] is a  43 y.o.  female with PMH listed below who was referred to me for evaluation of anemia. Most recent cbc showed hemoglobin 10, MCV 66.4, platelet 447.   History of iron deficiency: Denies. Rectal bleeding: denies Menstrual bleeding/ Vaginal bleeding : heavy menstrual period, uses a whole bag of pads for the first 3 days of period.  Hematemesis or hemoptysis : none  Blood in urine : none  Pica: craves for ice chips.  Last endoscopy: never.  Fatigue: yes.  # patient has had D&C and a hysteroscopy with minerva.  Patient had light bleeding for about a week after the procedure.  Vaginal bleeding stopped since then.  INTERVAL HISTORY KATLEYN CIFALDI is a 42 y.o. female who has above history reviewed by me today present for follow-up for management of iron deficiency anemia. During the interval she reports being diagnosed with Crohn's disease.  mentrual bleeding has been light.  No significant fatigue.  Also right knee pain.   Review of Systems  Constitutional: Negative for chills, fever, malaise/fatigue and weight loss.  HENT: Negative for congestion, ear discharge, ear pain, nosebleeds, sinus pain and sore throat.   Eyes: Negative for double vision, photophobia, pain, discharge and redness.  Respiratory: Negative for cough, hemoptysis, sputum production, shortness of breath and wheezing.   Cardiovascular: Negative for chest pain, palpitations, orthopnea, claudication and leg swelling.  Gastrointestinal: Negative for abdominal pain, blood in stool, constipation, diarrhea, heartburn,  melena, nausea and vomiting.  Genitourinary: Negative for dysuria, flank pain, frequency and hematuria.  Musculoskeletal: Negative for back pain, myalgias and neck pain.  Skin: Negative for itching and rash.  Neurological: Negative for dizziness, tingling, tremors, focal weakness, weakness and headaches.  Endo/Heme/Allergies: Negative for environmental allergies. Does not bruise/bleed easily.  Psychiatric/Behavioral: Negative for depression and hallucinations. The patient is not nervous/anxious.     MEDICAL HISTORY:  Past Medical History:  Diagnosis Date  . Anemia   . Bipolar 1 disorder ([place])   . Fibromyalgia   . GERD (gastroesophageal reflux disease)   . History of degenerative disc disease   . Hypertension   . Manic depression ([place])   . osteoarthritis    osteoarthritis  . Sciatica     SURGICAL HISTORY: Past Surgical History:  Procedure Laterality Date  . ANKLE SURGERY Left    ORIF  . BACK SURGERY     x3  . CHOLECYSTECTOMY  1998   Dr. Anthony Sar  . ERCP  2005   Dr. Laural Golden: s/p sphincerotomy with removal of sludge and debris  . HYSTEROSCOPY W/D&C N/A 01/26/2018   Procedure: DILATATION AND CURETTAGE /HYSTEROSCOPY WITH MINERVA;  Surgeon: Rubie Maid, MD;  Location: [facility] ORS;  Service: Gynecology;  Laterality: N/A;  . LUMBAR WOUND DEBRIDEMENT N/A 08/16/2015   Procedure: Spinal Cord Stimulator Revision;  Surgeon: Ashok Pall, MD;  Location: [place] NEURO ORS;  Service: Neurosurgery;  Laterality: N/A;  . SPINAL CORD STIMULATOR INSERTION N/A 08/16/2015   Procedure: LUMBAR SPINAL CORD STIMULATOR INSERTION;  Surgeon: Ashok Pall, MD;  Location: [place] NEURO ORS;  Service: Neurosurgery;  Laterality: N/A;  LUMBAR SPINAL CORD STIMULATOR INSERTION  . TUBAL LIGATION    .  UMBILICAL HERNIA REPAIR  1995   [facility]    SOCIAL HISTORY: Social History   Socioeconomic History  . Marital status: Married    Spouse name: Not on file  . Number of children: 2  . Years of education: Not on file   . Highest education level: Not on file  Occupational History  . Occupation: disabled  Social Needs  . Financial resource strain: Not on file  . Food insecurity    Worry: Not on file    Inability: Not on file  . Transportation needs    Medical: Not on file    Non-medical: Not on file  Tobacco Use  . Smoking status: Current Every Day Smoker    Packs/day: 1.00    Years: 20.00    Pack years: 20.00    Types: Cigarettes  . Smokeless tobacco: Never Used  Substance and Sexual Activity  . Alcohol use: No  . Drug use: Yes    Types: Marijuana    Comment: none since 07/2016  . Sexual activity: Yes    Birth control/protection: Surgical  Lifestyle  . Physical activity    Days per week: Not on file    Minutes per session: Not on file  . Stress: Not on file  Relationships  . Social Herbalist on phone: Not on file    Gets together: Not on file    Attends religious service: Not on file    Active member of club or organization: Not on file    Attends meetings of clubs or organizations: Not on file    Relationship status: Not on file  . Intimate partner violence    Fear of current or ex partner: Not on file    Emotionally abused: Not on file    Physically abused: Not on file    Forced sexual activity: Not on file  Other Topics Concern  . Not on file  Social History Narrative  . Not on file    FAMILY HISTORY: Family History  Problem Relation Age of Onset  . Crohn's disease Maternal Aunt   . Rheum arthritis Mother   . Diabetes Mother   . Clotting disorder Mother   . Breast cancer Mother 83  . Cancer Mother 47       breast (may have been precancerous)  . Colon cancer Neg Hx     ALLERGIES:  is allergic to vicodin [hydrocodone-acetaminophen]; acetaminophen; hydrocil [psyllium]; and percocet [oxycodone-acetaminophen].  MEDICATIONS:  Current Outpatient Medications  Medication Sig Dispense Refill  . ALPRAZolam (XANAX) 1 MG tablet Take 1 mg by mouth 3 (three) times  daily as needed for anxiety.    Marland Kitchen amitriptyline (ELAVIL) 150 MG tablet Take 1 tablet by mouth at bedtime.    . Aspirin-Acetaminophen-Caffeine (EXCEDRIN PO) Take 1-2 tablets by mouth 2 (two) times daily as needed (headaches).     . baclofen (LIORESAL) 10 MG tablet     . Buprenorphine HCl-Naloxone HCl (SUBOXONE) 8-2 MG FILM Place 1 Film under the tongue 3 (three) times daily.    Marland Kitchen DEXILANT 60 MG capsule Take 60 mg by mouth daily.    Marland Kitchen gabapentin (NEURONTIN) 600 MG tablet Take 600 mg by mouth 2 (two) times daily.     Marland Kitchen lisinopril-hydrochlorothiazide (PRINZIDE,ZESTORETIC) 20-25 MG tablet Take 1 tablet by mouth daily.    Marland Kitchen umeclidinium-vilanterol (ANORO ELLIPTA) 62.5-25 MCG/INH AEPB Inhale 1 puff into the lungs daily.    . Iron-Vitamin C 65-125 MG TABS Take 1 tablet by  mouth daily. (Patient not taking: Reported on 12/22/2018) 30 tablet 5  . varenicline (CHANTIX) 0.5 MG tablet Take 1 tablet (0.5 mg total) by mouth 2 (two) times daily. (Patient not taking: Reported on 12/22/2018) 60 tablet 0   No current facility-administered medications for this visit.    Facility-Administered Medications Ordered in Other Visits  Medication Dose Route Frequency Provider Last Rate Last Dose  . ondansetron (ZOFRAN) 4 mg in sodium chloride 0.9 % 50 mL IVPB  4 mg Intravenous Q6H PRN Ashok Pall, MD         PHYSICAL EXAMINATION: ECOG PERFORMANCE STATUS: 0 - Asymptomatic Vitals:   12/22/18 1046  BP: 119/76  Pulse: 85  Resp: 18  Temp: (!) 97.4 F (36.3 C)   Filed Weights   12/22/18 1046  Weight: 250 lb 3.2 oz (113.5 kg)    Physical Exam Constitutional:      General: She is not in acute distress.    Appearance: She is obese.     Comments: Obese  HENT:     Head: Normocephalic and atraumatic.  Eyes:     General: No scleral icterus.    Pupils: Pupils are equal, round, and reactive to light.  Neck:     Musculoskeletal: Normal range of motion and neck supple.  Cardiovascular:     Rate and Rhythm: Normal  rate and regular rhythm.     Heart sounds: Normal heart sounds.  Pulmonary:     Effort: Pulmonary effort is normal. No respiratory distress.     Breath sounds: No wheezing.  Abdominal:     General: Bowel sounds are normal. There is no distension.     Palpations: Abdomen is soft. There is no mass.     Tenderness: There is no abdominal tenderness.  Musculoskeletal: Normal range of motion.        General: No deformity.  Lymphadenopathy:     Cervical: No cervical adenopathy.  Skin:    General: Skin is warm and dry.     Findings: No erythema or rash.  Neurological:     Mental Status: She is alert and oriented to person, place, and time.     Cranial Nerves: No cranial nerve deficit.     Coordination: Coordination normal.  Psychiatric:        Behavior: Behavior normal.        Thought Content: Thought content normal.      LABORATORY DATA:  I have reviewed the data as listed Lab Results  Component Value Date   WBC 5.6 12/21/2018   HGB 13.0 12/21/2018   HCT 41.3 12/21/2018   MCV 81.9 12/21/2018   PLT 336 12/21/2018   No results for input(s): NA, K, CL, CO2, GLUCOSE, BUN, CREATININE, CALCIUM, GFRNONAA, GFRAA, PROT, ALBUMIN, AST, ALT, ALKPHOS, BILITOT, BILIDIR, IBILI in the last 8760 hours.     ASSESSMENT & PLAN:  1. Iron deficiency anemia, unspecified iron deficiency anemia type    Labs are reviewed and discussed with patient. CBC showed normal hemoglobin. Iron panel showed stable iron store.  Hold additional IV iron treatment.   Recommend repeat CBC, TIBC, ferritin, in 6 months for reassessment.  Return of visit: 6 months.  We spent sufficient time to discuss many aspect of care, questions were answered to patient's satisfaction. Total face to face encounter time for this patient visit was 15 min. >50% of the time was  spent in counseling and coordination of care.     Earlie Server, MD, PhD Hematology Oncology  [facility]-  [phone] 12/22/2018

## 2018-12-28 ENCOUNTER — Other Ambulatory Visit: Payer: 59

## 2018-12-29 ENCOUNTER — Ambulatory Visit: Payer: 59

## 2018-12-29 ENCOUNTER — Ambulatory Visit: Payer: 59 | Admitting: Oncology

## 2019-01-12 ENCOUNTER — Ambulatory Visit: Payer: 59 | Attending: Family Medicine

## 2019-01-12 ENCOUNTER — Other Ambulatory Visit: Payer: Self-pay

## 2019-01-12 DIAGNOSIS — Z20822 Contact with and (suspected) exposure to covid-19: Secondary | ICD-10-CM

## 2019-01-13 LAB — NOVEL CORONAVIRUS, NAA: SARS-CoV-2, NAA: NOT DETECTED

## 2019-02-08 ENCOUNTER — Ambulatory Visit: Payer: 59 | Admitting: Dietician

## 2019-02-19 ENCOUNTER — Encounter
Admission: AD | Disposition: A | Discharge status: Home / Self Care | Payer: Self-pay | Source: Ambulatory Visit | Attending: Internal Medicine

## 2019-02-19 ENCOUNTER — Encounter: Payer: Self-pay | Admitting: Internal Medicine

## 2019-02-19 ENCOUNTER — Ambulatory Visit: Payer: 59 | Admitting: Anesthesiology

## 2019-02-19 ENCOUNTER — Ambulatory Visit
Admission: AD | Admit: 2019-02-19 | Discharge: 2019-02-19 | Disposition: A | Discharge status: Home / Self Care | Payer: 59 | Source: Ambulatory Visit | Attending: Internal Medicine | Admitting: Internal Medicine

## 2019-02-19 DIAGNOSIS — K2100 Gastro-esophageal reflux disease with esophagitis, without bleeding: Secondary | ICD-10-CM | POA: Diagnosis not present

## 2019-02-19 DIAGNOSIS — Z6841 Body Mass Index (BMI) 40.0 and over, adult: Secondary | ICD-10-CM | POA: Insufficient documentation

## 2019-02-19 DIAGNOSIS — K319 Disease of stomach and duodenum, unspecified: Secondary | ICD-10-CM | POA: Insufficient documentation

## 2019-02-19 DIAGNOSIS — F319 Bipolar disorder, unspecified: Secondary | ICD-10-CM | POA: Insufficient documentation

## 2019-02-19 DIAGNOSIS — Z79899 Other long term (current) drug therapy: Secondary | ICD-10-CM | POA: Insufficient documentation

## 2019-02-19 DIAGNOSIS — Z885 Allergy status to narcotic agent status: Secondary | ICD-10-CM | POA: Diagnosis not present

## 2019-02-19 DIAGNOSIS — R6881 Early satiety: Secondary | ICD-10-CM | POA: Insufficient documentation

## 2019-02-19 DIAGNOSIS — D649 Anemia, unspecified: Secondary | ICD-10-CM | POA: Insufficient documentation

## 2019-02-19 DIAGNOSIS — Z888 Allergy status to other drugs, medicaments and biological substances status: Secondary | ICD-10-CM | POA: Insufficient documentation

## 2019-02-19 DIAGNOSIS — M1990 Unspecified osteoarthritis, unspecified site: Secondary | ICD-10-CM | POA: Insufficient documentation

## 2019-02-19 DIAGNOSIS — Z9049 Acquired absence of other specified parts of digestive tract: Secondary | ICD-10-CM | POA: Insufficient documentation

## 2019-02-19 DIAGNOSIS — K2970 Gastritis, unspecified, without bleeding: Secondary | ICD-10-CM | POA: Insufficient documentation

## 2019-02-19 DIAGNOSIS — M797 Fibromyalgia: Secondary | ICD-10-CM | POA: Diagnosis not present

## 2019-02-19 DIAGNOSIS — K591 Functional diarrhea: Secondary | ICD-10-CM | POA: Insufficient documentation

## 2019-02-19 DIAGNOSIS — R634 Abnormal weight loss: Secondary | ICD-10-CM | POA: Insufficient documentation

## 2019-02-19 DIAGNOSIS — I10 Essential (primary) hypertension: Secondary | ICD-10-CM | POA: Diagnosis not present

## 2019-02-19 DIAGNOSIS — K625 Hemorrhage of anus and rectum: Secondary | ICD-10-CM | POA: Diagnosis not present

## 2019-02-19 DIAGNOSIS — Z7982 Long term (current) use of aspirin: Secondary | ICD-10-CM | POA: Diagnosis not present

## 2019-02-19 HISTORY — PX: COLONOSCOPY WITH PROPOFOL: SHX5780

## 2019-02-19 HISTORY — PX: ESOPHAGOGASTRODUODENOSCOPY (EGD) WITH PROPOFOL: SHX5813

## 2019-02-19 LAB — POCT PREGNANCY, URINE: Preg Test, Ur: NEGATIVE

## 2019-02-19 SURGERY — ESOPHAGOGASTRODUODENOSCOPY (EGD) WITH PROPOFOL
Anesthesia: General

## 2019-02-19 MED ORDER — LIDOCAINE HCL (PF) 1 % IJ SOLN
INTRAMUSCULAR | Status: AC
  Filled 2019-02-19: qty 2

## 2019-02-19 MED ORDER — LIDOCAINE HCL (CARDIAC) PF 100 MG/5ML IV SOSY
PREFILLED_SYRINGE | INTRAVENOUS | Status: DC | PRN
  Administered 2019-02-19: 60 mg via INTRAVENOUS
  Administered 2019-02-19: 40 mg via INTRAVENOUS

## 2019-02-19 MED ORDER — DEXMEDETOMIDINE HCL 200 MCG/2ML IV SOLN
INTRAVENOUS | Status: DC | PRN
  Administered 2019-02-19: 4 ug via INTRAVENOUS
  Administered 2019-02-19: 16 ug via INTRAVENOUS

## 2019-02-19 MED ORDER — PROPOFOL 10 MG/ML IV BOLUS
INTRAVENOUS | Status: DC | PRN
  Administered 2019-02-19: 10 mg via INTRAVENOUS
  Administered 2019-02-19: 30 mg via INTRAVENOUS
  Administered 2019-02-19: 60 mg via INTRAVENOUS
  Administered 2019-02-19: 40 mg via INTRAVENOUS
  Administered 2019-02-19: 20 mg via INTRAVENOUS

## 2019-02-19 MED ORDER — PROPOFOL 10 MG/ML IV BOLUS
INTRAVENOUS | Status: AC
  Filled 2019-02-19: qty 20

## 2019-02-19 MED ORDER — SODIUM CHLORIDE 0.9 % IV SOLN: INTRAVENOUS | Status: DC

## 2019-02-19 MED ORDER — PROPOFOL 500 MG/50ML IV EMUL
INTRAVENOUS | Status: DC | PRN
  Administered 2019-02-19: 150 ug/kg/min via INTRAVENOUS

## 2019-02-19 MED ORDER — SODIUM CHLORIDE (PF) 0.9 % IJ SOLN
INTRAMUSCULAR | Status: AC
  Filled 2019-02-19: qty 10

## 2019-02-19 MED ORDER — LIDOCAINE HCL (PF) 2 % IJ SOLN
INTRAMUSCULAR | Status: AC
  Filled 2019-02-19: qty 10

## 2019-02-19 MED ORDER — PROPOFOL 500 MG/50ML IV EMUL
INTRAVENOUS | Status: AC
  Filled 2019-02-19: qty 50

## 2019-02-19 MED ORDER — DEXMEDETOMIDINE HCL IN NACL 80 MCG/20ML IV SOLN
INTRAVENOUS | Status: AC
  Filled 2019-02-19: qty 20

## 2019-02-19 MED ORDER — SODIUM CHLORIDE 0.9 % IV SOLN: INTRAVENOUS | Status: DC | PRN

## 2019-02-19 NOTE — Op Note (Signed)
[facility] Patient Name: [name] Procedure Date: 02/19/2019 3:29 PM MRN: [id number] Account #: [id number] Date of Birth: [birth year] Admit Type: Outpatient Age: 44 Room: [facility] ENDO ROOM 1 Gender: Female Note Status: Finalized Procedure:             Upper GI endoscopy Indications:           Upper abdominal pain, Gastro-esophageal reflux                         disease, Early satiety Providers:             Benay Pike. Delance Weide MD, MD Medicines:             Propofol per Anesthesia Complications:         No immediate complications. Procedure:             Pre-Anesthesia Assessment:                        - The risks and benefits of the procedure and the                         sedation options and risks were discussed with the                         patient. All questions were answered and informed                         consent was obtained.                        - Patient identification and proposed procedure were                         verified prior to the procedure by the nurse. The                         procedure was verified in the procedure room.                        - ASA Grade Assessment: III - A patient with severe                         systemic disease.                        - After reviewing the risks and benefits, the patient                         was deemed in satisfactory condition to undergo the                         procedure.                        After obtaining informed consent, the endoscope was                         passed under direct vision. Throughout the procedure,  the patient's blood pressure, pulse, and oxygen                         saturations were monitored continuously. The Endoscope                         was introduced through the mouth, and advanced to the                         third part of duodenum. The upper GI endoscopy was                         accomplished  without difficulty. The patient tolerated                         the procedure well. Findings:      The esophagus was normal.      Localized minimal inflammation characterized by erythema was found in       the gastric antrum. Biopsies were taken with a cold forceps for       Helicobacter pylori testing.      The cardia and gastric fundus were normal on retroflexion.      The examined duodenum was normal.      The exam was otherwise without abnormality. Impression:            - Normal esophagus.                        - Gastritis. Biopsied.                        - Normal examined duodenum.                        - The examination was otherwise normal. Recommendation:        - Await pathology results.                        - Proceed with colonoscopy Procedure Code(s):     --- Professional ---                        [phone], Esophagogastroduodenoscopy, flexible,                         transoral; with biopsy, single or multiple Diagnosis Code(s):     --- Professional ---                        K29.70, Gastritis, unspecified, without bleeding                        R10.10, Upper abdominal pain, unspecified                        K21.9, Gastro-esophageal reflux disease without                         esophagitis                        R68.81, Early satiety CPT copyright 2019 [employer] Medical Association. All rights reserved.  The codes documented in this report are preliminary and upon coder review may  be revised to meet current compliance requirements. Efrain Sella MD, MD 02/19/2019 4:08:41 PM This report has been signed electronically. Number of Addenda: 0 Note Initiated On: 02/19/2019 3:29 PM Estimated Blood Loss:  Estimated blood loss: none.      [facility]

## 2019-02-19 NOTE — Op Note (Signed)
[facility] Gastroenterology Patient Name: [name] Procedure Date: 02/19/2019 3:28 PM MRN: [id number] Account #: [id number] Date of Birth: Dec 08, 1975 Admit Type: Outpatient Age: 44 Room: [facility] ENDO ROOM 1 Gender: Female Note Status: Finalized Procedure:             Colonoscopy Indications:           Functional diarrhea Providers:             Benay Pike. Shameeka Silliman MD, MD Medicines:             Propofol per Anesthesia Complications:         No immediate complications. Procedure:             Pre-Anesthesia Assessment:                        - The risks and benefits of the procedure and the                         sedation options and risks were discussed with the                         patient. All questions were answered and informed                         consent was obtained.                        - Patient identification and proposed procedure were                         verified prior to the procedure by the nurse. The                         procedure was verified in the procedure room.                        - ASA Grade Assessment: III - A patient with severe                         systemic disease.                        - After reviewing the risks and benefits, the patient                         was deemed in satisfactory condition to undergo the                         procedure.                        After obtaining informed consent, the colonoscope was                         passed under direct vision. Throughout the procedure,                         the patient's blood pressure, pulse, and oxygen  saturations were monitored continuously. The                         Colonoscope was introduced through the anus and                         advanced to the the terminal ileum, with                         identification of the appendiceal orifice and IC                         valve. The colonoscopy was performed without                       difficulty. The patient tolerated the procedure well.                         The quality of the bowel preparation was adequate. The                         terminal ileum, ileocecal valve, appendiceal orifice,                         and rectum were photographed. Findings:      The perianal and digital rectal examinations were normal. Pertinent       negatives include normal sphincter tone and no palpable rectal lesions.      The terminal ileum appeared normal. Biopsies were taken with a cold       forceps for histology.      Normal mucosa was found in the entire colon. Biopsies for histology were       taken with a cold forceps from the right colon, left colon and rectum       for evaluation of microscopic colitis.      The exam was otherwise without abnormality on direct and retroflexion       views. Impression:            - The examined portion of the ileum was normal.                         Biopsied.                        - Normal mucosa in the entire examined colon. Biopsied.                        - The examination was otherwise normal on direct and                         retroflexion views. Recommendation:        - Await pathology results from EGD, also performed                         today.                        - Patient has a contact number available for  emergencies. The signs and symptoms of potential                         delayed complications were discussed with the patient.                         Return to normal activities tomorrow. Written                         discharge instructions were provided to the patient.                        - Resume previous diet.                        - Continue present medications.                        - Await pathology results.                        - Return to nurse practitioner in 6 weeks.                        - Follow up with Stephens November, GI Nurse                          Practioner, in office to discuss results and monitor                         progress.                        - Repeat colonoscopy in 6 years for screening purposes.                        - The findings and recommendations were discussed with                         the patient. Procedure Code(s):     --- Professional ---                        [phone], Colonoscopy, flexible; with biopsy, single or                         multiple Diagnosis Code(s):     --- Professional ---                        K59.1, Functional diarrhea CPT copyright 2019 American Medical Association. All rights reserved. The codes documented in this report are preliminary and upon coder review may  be revised to meet current compliance requirements. Efrain Sella MD, MD 02/19/2019 4:33:15 PM This report has been signed electronically. Number of Addenda: 0 Note Initiated On: 02/19/2019 3:28 PM Scope Withdrawal Time: 0 hours 12 minutes 28 seconds  Total Procedure Duration: 0 hours 17 minutes 35 seconds  Estimated Blood Loss:  Estimated blood loss: none.      [facility]

## 2019-02-19 NOTE — Interval H&P Note (Signed)
History and Physical Interval Note:  02/19/2019 3:55 PM  [name]  has presented today for surgery, with the diagnosis of Abdominal Pain; Change in Bowel Habits.  The various methods of treatment have been discussed with the patient and family. After consideration of risks, benefits and other options for treatment, the patient has consented to  Procedure(s): ESOPHAGOGASTRODUODENOSCOPY (EGD) WITH PROPOFOL (N/A) COLONOSCOPY WITH PROPOFOL (N/A) as a surgical intervention.  The patient's history has been reviewed, patient examined, no change in status, stable for surgery.  I have reviewed the patient's chart and labs.  Questions were answered to the patient's satisfaction.     [place], [place]

## 2019-02-19 NOTE — Anesthesia Preprocedure Evaluation (Signed)
Anesthesia Evaluation  Patient identified by MRN, date of birth, ID band Patient awake    Reviewed: Allergy & Precautions, H&P , NPO status , Patient's Chart, lab work & pertinent test results  History of Anesthesia Complications Negative for: history of anesthetic complications  Airway Mallampati: III  TM Distance: >3 FB     Dental  (+) Upper Dentures, Lower Dentures   Pulmonary neg shortness of breath, neg COPD, neg recent URI, Current Smoker and Patient abstained from smoking.,           Cardiovascular Exercise Tolerance: Good hypertension, (-) angina(-) Past MI and (-) Cardiac Stents (-) dysrhythmias (-) Valvular Problems/Murmurs     Neuro/Psych neg Seizures PSYCHIATRIC DISORDERS Bipolar Disorder  Neuromuscular disease (fibromyalgia)    GI/Hepatic Neg liver ROS, GERD  Controlled,  Endo/Other  neg diabetesHyperthyroidism Morbid obesity  Renal/GU negative Renal ROS     Musculoskeletal  (+) Arthritis , Fibromyalgia -  Abdominal   Peds  Hematology  (+) Blood dyscrasia, anemia ,   Anesthesia Other Findings Past Medical History: No date: Anemia No date: Bipolar 1 disorder (HCC) No date: Fibromyalgia No date: GERD (gastroesophageal reflux disease) No date: History of degenerative disc disease No date: Hypertension No date: Manic depression (HCC) No date: osteoarthritis     Comment:  osteoarthritis No date: Sciatica  Past Surgical History: No date: ANKLE SURGERY; Left     Comment:  ORIF No date: BACK SURGERY     Comment:  x3 1998: CHOLECYSTECTOMY     Comment:  Dr. Anthony Sar 2005: ERCP     Comment:  Dr. Laural Golden: s/p sphincerotomy with removal of sludge and              debris 08/16/2015: LUMBAR WOUND DEBRIDEMENT; N/A     Comment:  Procedure: Spinal Cord Stimulator Revision;  Surgeon:               Ashok Pall, MD;  Location: [place] NEURO ORS;  Service:               Neurosurgery;  Laterality: N/A; 08/16/2015:  SPINAL CORD STIMULATOR INSERTION; N/A     Comment:  Procedure: LUMBAR SPINAL CORD STIMULATOR INSERTION;                Surgeon: Ashok Pall, MD;  Location: [place] NEURO ORS;                Service: Neurosurgery;  Laterality: N/A;  LUMBAR SPINAL               CORD STIMULATOR INSERTION No date: TUBAL LIGATION [id number]: UMBILICAL HERNIA REPAIR     Comment:  [facility]     Reproductive/Obstetrics negative OB ROS                             Anesthesia Physical  Anesthesia Plan  ASA: III  Anesthesia Plan: General   Post-op Pain Management:    Induction: Intravenous  PONV Risk Score and Plan: 3 and Propofol infusion and TIVA  Airway Management Planned: Natural Airway and Nasal Cannula  Additional Equipment:   Intra-op Plan:   Post-operative Plan:   Informed Consent: I have reviewed the patients History and Physical, chart, labs and discussed the procedure including the risks, benefits and alternatives for the proposed anesthesia with the patient or authorized representative who has indicated his/her understanding and acceptance.     Dental Advisory Given  Plan Discussed with: Anesthesiologist, CRNA and  Surgeon  Anesthesia Plan Comments:         Anesthesia Quick Evaluation

## 2019-02-19 NOTE — Transfer of Care (Signed)
Immediate Anesthesia Transfer of Care Note  Patient: [name]  Procedure(s) Performed: ESOPHAGOGASTRODUODENOSCOPY (EGD) WITH PROPOFOL (N/A ) COLONOSCOPY WITH PROPOFOL (N/A )  Patient Location: Endoscopy Unit  Anesthesia Type:General  Level of Consciousness: drowsy  Airway & Oxygen Therapy: Patient Spontanous Breathing  Post-op Assessment: Report given to RN and Post -op Vital signs reviewed and stable  Post vital signs: Reviewed and stable  Last Vitals:  Vitals Value Taken Time  BP 123/59 02/19/19 1636  Temp    Pulse 88 02/19/19 1636  Resp 16 02/19/19 1636  SpO2 95 % 02/19/19 1636  Vitals shown include unvalidated device data.  Last Pain:  Vitals:   02/19/19 1527  PainSc: 0-No pain      Patients Stated Pain Goal: 0 ([id number])  Complications: No apparent anesthesia complications

## 2019-02-19 NOTE — H&P (Signed)
Outpatient short stay form Pre-procedure 02/19/2019 3:42 PM [name] K. [name], M.D.  Primary Physician: Threasa Alpha, FNP  Reason for visit:  GERD, early satiety, ?Crohn's disease, chronic diarrhea  History of present illness:  As above, Patient has longstanding diarrhea, early satiety without dysphagia, rectal bleeding, weight loss. Inflammatory markers reportedly negative.     Current Facility-Administered Medications:  .  0.9 %  sodium chloride infusion, , Intravenous, Continuous, Feleica Fulmore, [place] K, MD .  lidocaine (PF) (XYLOCAINE) 1 % injection, , , ,   Facility-Administered Medications Ordered in Other Encounters:  .  ondansetron (ZOFRAN) 4 mg in sodium chloride 0.9 % 50 mL IVPB, 4 mg, Intravenous, Q6H PRN, Ashok Pall, MD  Medications Prior to Admission  Medication Sig Dispense Refill Last Dose  . ALPRAZolam (XANAX) 1 MG tablet Take 1 mg by mouth 3 (three) times daily as needed for anxiety.   Past Week at Unknown time  . amitriptyline (ELAVIL) 150 MG tablet Take 1 tablet by mouth at bedtime.   02/18/2019 at Unknown time  . Aspirin-Acetaminophen-Caffeine (EXCEDRIN PO) Take 1-2 tablets by mouth 2 (two) times daily as needed (headaches).    Past Week at Unknown time  . baclofen (LIORESAL) 10 MG tablet    02/19/2019 at 900  . Buprenorphine HCl-Naloxone HCl (SUBOXONE) 8-2 MG FILM Place 1 Film under the tongue 3 (three) times daily.   02/18/2019 at Unknown time  . DEXILANT 60 MG capsule Take 60 mg by mouth daily.   02/19/2019 at 900  . gabapentin (NEURONTIN) 600 MG tablet Take 600 mg by mouth 2 (two) times daily.    02/19/2019 at 900  . Iron-Vitamin C 65-125 MG TABS Take 1 tablet by mouth daily. 30 tablet 5 02/18/2019 at Unknown time  . lisinopril-hydrochlorothiazide (PRINZIDE,ZESTORETIC) 20-25 MG tablet Take 1 tablet by mouth daily.   02/19/2019 at 900  . umeclidinium-vilanterol (ANORO ELLIPTA) 62.5-25 MCG/INH AEPB Inhale 1 puff into the lungs daily.   02/19/2019 at Unknown time  .  varenicline (CHANTIX) 0.5 MG tablet Take 1 tablet (0.5 mg total) by mouth 2 (two) times daily. (Patient not taking: Reported on 12/22/2018) 60 tablet 0 Completed Course at Unknown time     Allergies  Allergen Reactions  . Vicodin [Hydrocodone-Acetaminophen] Nausea And Vomiting  . Acetaminophen Nausea Only  . Hydrocil [Psyllium] Nausea Only  . Percocet [Oxycodone-Acetaminophen] Nausea Only     Past Medical History:  Diagnosis Date  . Anemia   . Bipolar 1 disorder ([place])   . Fibromyalgia   . GERD (gastroesophageal reflux disease)   . History of degenerative disc disease   . Hypertension   . Manic depression ([place])   . osteoarthritis    osteoarthritis  . Sciatica     Review of systems:  Otherwise negative.    Physical Exam  Gen: Alert, oriented. Appears stated age.  HEENT: [place]/AT. PERRLA. Lungs: CTA, no wheezes. CV: RR nl S1, S2. Abd: soft, benign, no masses. BS+ Ext: No edema. Pulses 2+    Planned procedures: Proceed with EGD and colonoscopy. The patient understands the nature of the planned procedure, indications, risks, alternatives and potential complications including but not limited to bleeding, infection, perforation, damage to internal organs and possible oversedation/side effects from anesthesia. The patient agrees and gives consent to proceed.  Please refer to procedure notes for findings, recommendations and patient disposition/instructions.     [name] K. [name], M.D. Gastroenterology 02/19/2019  3:42 PM

## 2019-02-20 NOTE — Anesthesia Postprocedure Evaluation (Signed)
Anesthesia Post Note  Patient: JENIQUE BERENDSEN  Procedure(s) Performed: ESOPHAGOGASTRODUODENOSCOPY (EGD) WITH PROPOFOL (N/A ) COLONOSCOPY WITH PROPOFOL (N/A )  Patient location during evaluation: Endoscopy Anesthesia Type: General Level of consciousness: awake and alert Pain management: pain level controlled Vital Signs Assessment: post-procedure vital signs reviewed and stable Respiratory status: spontaneous breathing, nonlabored ventilation, respiratory function stable and patient connected to nasal cannula oxygen Cardiovascular status: blood pressure returned to baseline and stable Postop Assessment: no apparent nausea or vomiting Anesthetic complications: no     Last Vitals:  Vitals:   02/19/19 1650 02/19/19 1710  BP: 117/83 119/64  Pulse: 90 78  Resp: 15 16  Temp:    SpO2: 99% 96%    Last Pain:  Vitals:   02/19/19 1630  TempSrc: Oral  PainSc:                  Martha Clan

## 2019-02-23 LAB — SURGICAL PATHOLOGY

## 2019-03-22 ENCOUNTER — Encounter: Payer: Self-pay | Admitting: Dietician

## 2019-03-22 NOTE — Progress Notes (Signed)
Have not heard back from patient to reschedule her cancelled appointment from 02/08/19. Sent notification to referring provider.

## 2019-06-18 ENCOUNTER — Other Ambulatory Visit: Payer: Self-pay

## 2019-06-18 ENCOUNTER — Inpatient Hospital Stay: Payer: 59 | Attending: Oncology

## 2019-06-18 DIAGNOSIS — D509 Iron deficiency anemia, unspecified: Secondary | ICD-10-CM | POA: Insufficient documentation

## 2019-06-18 LAB — CBC WITH DIFFERENTIAL/PLATELET
Abs Immature Granulocytes: 0.01 10*3/uL (ref 0.00–0.07)
Basophils Absolute: 0.1 10*3/uL (ref 0.0–0.1)
Basophils Relative: 1 %
Eosinophils Absolute: 0.1 10*3/uL (ref 0.0–0.5)
Eosinophils Relative: 2 %
HCT: 41.1 % (ref 36.0–46.0)
Hemoglobin: 13.8 g/dL (ref 12.0–15.0)
Immature Granulocytes: 0 %
Lymphocytes Relative: 41 %
Lymphs Abs: 2.6 10*3/uL (ref 0.7–4.0)
MCH: 26.7 pg (ref 26.0–34.0)
MCHC: 33.6 g/dL (ref 30.0–36.0)
MCV: 79.7 fL — ABNORMAL LOW (ref 80.0–100.0)
Monocytes Absolute: 0.5 10*3/uL (ref 0.1–1.0)
Monocytes Relative: 8 %
Neutro Abs: 3 10*3/uL (ref 1.7–7.7)
Neutrophils Relative %: 48 %
Platelets: 377 10*3/uL (ref 150–400)
RBC: 5.16 MIL/uL — ABNORMAL HIGH (ref 3.87–5.11)
RDW: 14.3 % (ref 11.5–15.5)
WBC: 6.4 10*3/uL (ref 4.0–10.5)
nRBC: 0 % (ref 0.0–0.2)

## 2019-06-18 LAB — FERRITIN: Ferritin: 57 ng/mL (ref 11–307)

## 2019-06-18 LAB — IRON AND TIBC
Iron: 54 ug/dL (ref 28–170)
Saturation Ratios: 15 % (ref 10.4–31.8)
TIBC: 351 ug/dL (ref 250–450)
UIBC: 297 ug/dL

## 2019-06-22 ENCOUNTER — Telehealth: Payer: Self-pay | Admitting: Oncology

## 2019-06-22 ENCOUNTER — Inpatient Hospital Stay: Payer: 59

## 2019-06-22 ENCOUNTER — Inpatient Hospital Stay: Payer: 59 | Admitting: Oncology

## 2019-06-22 NOTE — Telephone Encounter (Signed)
Dr. Tasia Catchings has reviewed labs and not need for IV iron.  Dr. Tasia Catchings offered patient to f/u with PCP and if any further need for IV iron they can refer her back.  Patient agreed with plan and was appreciative of no need further f/u at the [facility].

## 2019-06-22 NOTE — Telephone Encounter (Signed)
Done..  Pt f/u appts has been cx as requested.

## 2019-06-22 NOTE — Telephone Encounter (Signed)
Patient called to ask if she needs to come today based on her labs last Friday. Can you please call her and let her know if she needs to come?

## 2019-08-12 ENCOUNTER — Emergency Department (HOSPITAL_COMMUNITY)
Admission: EM | Admit: 2019-08-12 | Discharge: 2019-08-12 | Disposition: A | Discharge status: Home / Self Care | Payer: 59 | Attending: Emergency Medicine | Admitting: Emergency Medicine

## 2019-08-12 ENCOUNTER — Encounter (HOSPITAL_COMMUNITY): Payer: Self-pay | Admitting: *Deleted

## 2019-08-12 ENCOUNTER — Emergency Department (HOSPITAL_COMMUNITY): Payer: 59

## 2019-08-12 ENCOUNTER — Other Ambulatory Visit: Payer: Self-pay

## 2019-08-12 DIAGNOSIS — M79662 Pain in left lower leg: Secondary | ICD-10-CM | POA: Diagnosis not present

## 2019-08-12 DIAGNOSIS — Z832 Family history of diseases of the blood and blood-forming organs and certain disorders involving the immune mechanism: Secondary | ICD-10-CM | POA: Diagnosis not present

## 2019-08-12 DIAGNOSIS — Z5321 Procedure and treatment not carried out due to patient leaving prior to being seen by health care provider: Secondary | ICD-10-CM | POA: Diagnosis not present

## 2019-08-12 DIAGNOSIS — R2242 Localized swelling, mass and lump, left lower limb: Secondary | ICD-10-CM | POA: Diagnosis not present

## 2019-08-12 NOTE — ED Triage Notes (Signed)
Pt c/o pain and swelling to left lower leg; pt has a family hx of blood clots and she is having pain with walking; pt states she was told years ago that she has a "knot" in her left leg and is unsure if this pain is related

## 2019-08-13 ENCOUNTER — Other Ambulatory Visit: Payer: Self-pay

## 2019-08-13 ENCOUNTER — Emergency Department (HOSPITAL_COMMUNITY): Payer: 59

## 2019-08-13 ENCOUNTER — Encounter (HOSPITAL_COMMUNITY): Payer: Self-pay

## 2019-08-13 ENCOUNTER — Emergency Department (HOSPITAL_COMMUNITY)
Admission: EM | Admit: 2019-08-13 | Discharge: 2019-08-13 | Disposition: A | Discharge status: Home / Self Care | Payer: 59 | Attending: Emergency Medicine | Admitting: Emergency Medicine

## 2019-08-13 DIAGNOSIS — I10 Essential (primary) hypertension: Secondary | ICD-10-CM | POA: Insufficient documentation

## 2019-08-13 DIAGNOSIS — Z79899 Other long term (current) drug therapy: Secondary | ICD-10-CM | POA: Insufficient documentation

## 2019-08-13 DIAGNOSIS — Z7982 Long term (current) use of aspirin: Secondary | ICD-10-CM | POA: Insufficient documentation

## 2019-08-13 DIAGNOSIS — E876 Hypokalemia: Secondary | ICD-10-CM | POA: Diagnosis not present

## 2019-08-13 DIAGNOSIS — L03116 Cellulitis of left lower limb: Secondary | ICD-10-CM | POA: Diagnosis not present

## 2019-08-13 DIAGNOSIS — R2242 Localized swelling, mass and lump, left lower limb: Secondary | ICD-10-CM | POA: Diagnosis present

## 2019-08-13 DIAGNOSIS — F17210 Nicotine dependence, cigarettes, uncomplicated: Secondary | ICD-10-CM | POA: Diagnosis not present

## 2019-08-13 LAB — CBC WITH DIFFERENTIAL/PLATELET
Abs Immature Granulocytes: 0.01 10*3/uL (ref 0.00–0.07)
Basophils Absolute: 0 10*3/uL (ref 0.0–0.1)
Basophils Relative: 1 %
Eosinophils Absolute: 0.1 10*3/uL (ref 0.0–0.5)
Eosinophils Relative: 3 %
HCT: 43.1 % (ref 36.0–46.0)
Hemoglobin: 13.6 g/dL (ref 12.0–15.0)
Immature Granulocytes: 0 %
Lymphocytes Relative: 45 %
Lymphs Abs: 2 10*3/uL (ref 0.7–4.0)
MCH: 26.3 pg (ref 26.0–34.0)
MCHC: 31.6 g/dL (ref 30.0–36.0)
MCV: 83.2 fL (ref 80.0–100.0)
Monocytes Absolute: 0.3 10*3/uL (ref 0.1–1.0)
Monocytes Relative: 7 %
Neutro Abs: 1.9 10*3/uL (ref 1.7–7.7)
Neutrophils Relative %: 44 %
Platelets: 327 10*3/uL (ref 150–400)
RBC: 5.18 MIL/uL — ABNORMAL HIGH (ref 3.87–5.11)
RDW: 14.6 % (ref 11.5–15.5)
WBC: 4.4 10*3/uL (ref 4.0–10.5)
nRBC: 0 % (ref 0.0–0.2)

## 2019-08-13 LAB — COMPREHENSIVE METABOLIC PANEL
ALT: 25 U/L (ref 0–44)
AST: 20 U/L (ref 15–41)
Albumin: 4 g/dL (ref 3.5–5.0)
Alkaline Phosphatase: 62 U/L (ref 38–126)
Anion gap: 10 (ref 5–15)
BUN: 18 mg/dL (ref 6–20)
CO2: 29 mmol/L (ref 22–32)
Calcium: 9.1 mg/dL (ref 8.9–10.3)
Chloride: 99 mmol/L (ref 98–111)
Creatinine, Ser: 0.68 mg/dL (ref 0.44–1.00)
GFR calc Af Amer: >60 mL/min (ref >60)
GFR calc non Af Amer: >60 mL/min (ref >60)
Glucose, Bld: 121 mg/dL — ABNORMAL HIGH (ref 70–99)
Potassium: 2.7 mmol/L — CL (ref 3.5–5.1)
Sodium: 138 mmol/L (ref 135–145)
Total Bilirubin: 0.4 mg/dL (ref 0.3–1.2)
Total Protein: 7.8 g/dL (ref 6.5–8.1)

## 2019-08-13 MED ORDER — FENTANYL CITRATE (PF) 100 MCG/2ML IJ SOLN
50.0000 ug | Freq: Once | INTRAMUSCULAR | Status: AC
  Administered 2019-08-13: 50 ug via INTRAVENOUS
  Filled 2019-08-13: qty 2

## 2019-08-13 MED ORDER — DOXYCYCLINE HYCLATE 100 MG PO CAPS: 100.0000 mg | ORAL_CAPSULE | Freq: Two times a day (BID) | ORAL | 0 refills | Status: DC

## 2019-08-13 MED ORDER — MAGNESIUM SULFATE 2 GM/50ML IV SOLN
2.0000 g | Freq: Once | INTRAVENOUS | Status: AC
  Administered 2019-08-13: 2 g via INTRAVENOUS
  Filled 2019-08-13: qty 50

## 2019-08-13 MED ORDER — POTASSIUM CHLORIDE CRYS ER 20 MEQ PO TBCR
40.0000 meq | EXTENDED_RELEASE_TABLET | Freq: Once | ORAL | Status: AC
  Administered 2019-08-13: 40 meq via ORAL
  Filled 2019-08-13: qty 2

## 2019-08-13 MED ORDER — IBUPROFEN 800 MG PO TABS
800.0000 mg | ORAL_TABLET | Freq: Once | ORAL | Status: AC
  Administered 2019-08-13: 800 mg via ORAL
  Filled 2019-08-13: qty 1

## 2019-08-13 MED ORDER — POTASSIUM CHLORIDE ER 10 MEQ PO TBCR: 20.0000 meq | EXTENDED_RELEASE_TABLET | Freq: Every day | ORAL | 0 refills | Status: DC

## 2019-08-13 MED ORDER — POTASSIUM CHLORIDE 10 MEQ/100ML IV SOLN
10.0000 meq | INTRAVENOUS | Status: AC
  Administered 2019-08-13 (×2): 10 meq via INTRAVENOUS
  Filled 2019-08-13 (×2): qty 100

## 2019-08-13 NOTE — ED Provider Notes (Signed)
[facility] Provider Note   CSN: [id number] Arrival date & time: 08/13/19  1003     History Chief Complaint  Patient presents with  . Leg Pain    [name] is a 44 y.o. female with pertinent past medical history of anemia, bipolar 1, sciatica, osteoarthritis that presents the emergency department today for leg swelling.  Patient states that her  left leg has been swelling for the past month.  Has not seen PCP about this.  States that her left leg has also been warm and tender to touch in the past month.  States for the past couple of days has been difficult to walk due to the pain and therefore came to the ER.  Has not been taking anything for this.  States that that she does have a family history of clotting disorders, her mom.  She herself does not have any clotting disorders.  She states that she smokes about 15 cigarettes daily.  Denies any IV drug use.  Denies any recent surgeries, recent travel, estrogen use, pregnancy, history of cancer.  Denies any fevers, chills, cough, myalgias, sore throat, rashes.  Denies pain elsewhere.  Denies any chest pain, shortness of breath, dizziness, weakness, paresthesias.  HPI     Past Medical History:  Diagnosis Date  . Anemia   . Bipolar 1 disorder ([place])   . Fibromyalgia   . GERD (gastroesophageal reflux disease)   . History of degenerative disc disease   . Hypertension   . Manic depression ([place])   . osteoarthritis    osteoarthritis  . Sciatica     Patient Active Problem List   Diagnosis Date Noted  . S/P endometrial ablation 02/04/2018  . Subclinical hyperthyroidism 11/27/2017  . DUB (dysfunctional uterine bleeding) 11/27/2017  . Morbid obesity with BMI of 45.0-49.9, adult ([place]) 11/20/2017  . Abnormal thyroid blood test 11/20/2017  . Menorrhagia with regular cycle 11/20/2017  . Iron deficiency anemia 07/02/2017  . GERD (gastroesophageal reflux disease) 11/13/2016  . Nausea with vomiting 11/13/2016  .  Rectal bleeding 11/13/2016  . Fecal incontinence 11/13/2016  . Early satiety 11/13/2016  . Post laminectomy syndrome 08/16/2015    Past Surgical History:  Procedure Laterality Date  . ANKLE SURGERY Left    ORIF  . BACK SURGERY     x3  . CHOLECYSTECTOMY  1998   Dr. Anthony Sar  . COLONOSCOPY WITH PROPOFOL N/A 02/19/2019   Procedure: COLONOSCOPY WITH PROPOFOL;  Surgeon: Toledo, Benay Pike, MD;  Location: [facility] ENDOSCOPY;  Service: Gastroenterology;  Laterality: N/A;  . ERCP  2005   Dr. Laural Golden: s/p sphincerotomy with removal of sludge and debris  . ESOPHAGOGASTRODUODENOSCOPY (EGD) WITH PROPOFOL N/A 02/19/2019   Procedure: ESOPHAGOGASTRODUODENOSCOPY (EGD) WITH PROPOFOL;  Surgeon: Toledo, Benay Pike, MD;  Location: [facility] ENDOSCOPY;  Service: Gastroenterology;  Laterality: N/A;  . HYSTEROSCOPY WITH D & C N/A 01/26/2018   Procedure: DILATATION AND CURETTAGE /HYSTEROSCOPY WITH MINERVA;  Surgeon: Rubie Maid, MD;  Location: [facility] ORS;  Service: Gynecology;  Laterality: N/A;  . LUMBAR WOUND DEBRIDEMENT N/A 08/16/2015   Procedure: Spinal Cord Stimulator Revision;  Surgeon: Ashok Pall, MD;  Location: [place] [facility];  Service: Neurosurgery;  Laterality: N/A;  . SPINAL CORD STIMULATOR INSERTION N/A 08/16/2015   Procedure: LUMBAR SPINAL CORD STIMULATOR INSERTION;  Surgeon: Ashok Pall, MD;  Location: [place] [facility]UR[facility] ORS;  Service: Neurosurgery;  Laterality: N/A;  LUMBAR SPINAL CORD STIMULATOR INSERTION  . TUBAL LIGATION    . [place]  [facility]     OB History    Gravida  2   Para  2   Term  2   Preterm      AB      Living  2     SAB      TAB      Ectopic      Multiple      Live Births  2           Family History  Problem Relation Age of Onset  . Crohn's disease Maternal Aunt   . Rheum arthritis Mother   . Diabetes Mother   . Clotting disorder Mother   . Breast cancer Mother 42  . Cancer Mother 63       breast (may have been precancerous)  . Colon cancer  Neg Hx     Social History   Tobacco Use  . Smoking status: Current Every Day Smoker    Packs/day: 1.00    Years: 20.00    Pack years: 20.00    Types: Cigarettes  . Smokeless tobacco: Never Used  Vaping Use  . Vaping Use: Never used  Substance Use Topics  . Alcohol use: No  . Drug use: Yes    Types: Marijuana    Comment: none since 07/2016    Home Medications Prior to Admission medications   Medication Sig Start Date End Date Taking? Authorizing Provider  ALPRAZolam Duanne Moron) 1 MG tablet Take 1 mg by mouth 3 (three) times daily as needed for anxiety.    [provider]  amitriptyline (ELAVIL) 150 MG tablet Take 1 tablet by mouth at bedtime. 10/21/16   [provider]  Aspirin-Acetaminophen-Caffeine (EXCEDRIN PO) Take 1-2 tablets by mouth 2 (two) times daily as needed (headaches).     [provider]  baclofen (LIORESAL) 10 MG tablet  11/25/17   [provider]  Buprenorphine HCl-Naloxone HCl (SUBOXONE) 8-2 MG FILM Place 1 Film under the tongue 3 (three) times daily.    [provider]  DEXILANT 60 MG capsule Take 60 mg by mouth daily. 07/04/15   [provider]  doxycycline (VIBRAMYCIN) 100 MG capsule Take 1 capsule (100 mg total) by mouth 2 (two) times daily. 08/13/19   Alfredia Client, PA-C  gabapentin (NEURONTIN) 600 MG tablet Take 600 mg by mouth 2 (two) times daily.  04/27/17   [provider]  Iron-Vitamin C 65-125 MG TABS Take 1 tablet by mouth daily. 03/30/18   Earlie Server, MD  lisinopril-hydrochlorothiazide (PRINZIDE,ZESTORETIC) 20-25 MG tablet Take 1 tablet by mouth daily. 09/24/16   [provider]  potassium chloride (KLOR-CON) 10 MEQ tablet Take 2 tablets (20 mEq total) by mouth daily for 10 days. 08/13/19 08/23/19  Alfredia Client, PA-C  umeclidinium-vilanterol (ANORO ELLIPTA) 62.5-25 MCG/INH AEPB Inhale 1 puff into the lungs daily.    [provider]  varenicline (CHANTIX) 0.5 MG tablet Take 1 tablet (0.5 mg  total) by mouth 2 (two) times daily. Patient not taking: Reported on 12/22/2018 05/10/17   Mesner, Corene Cornea, MD    Allergies    Vicodin [hydrocodone-acetaminophen], Acetaminophen, Hydrocil [psyllium], and Percocet [oxycodone-acetaminophen]  Review of Systems   Review of Systems  Constitutional: Negative for chills, diaphoresis, fatigue and fever.  HENT: Negative for congestion, sore throat and trouble swallowing.   Eyes: Negative for pain and visual disturbance.  Respiratory: Negative for cough, shortness of breath and wheezing.   Cardiovascular: Positive for leg swelling. Negative for chest pain and palpitations.  Gastrointestinal: Negative for abdominal distention, abdominal pain, diarrhea, nausea and vomiting.  Genitourinary: Negative for difficulty urinating.  Musculoskeletal: Negative for back pain, neck pain and neck stiffness.  Skin: Negative for pallor.  Neurological: Negative for dizziness, speech difficulty, weakness and headaches.  Psychiatric/Behavioral: Negative for confusion.    Physical Exam Updated Vital Signs BP (!) 132/83   Pulse 88   Temp 98.2 F (36.8 C) (Oral)   Resp 17   Ht 5' (1.524 m)   Wt (!) 109.8 kg   SpO2 99%   BMI 47.26 kg/m   Physical Exam Constitutional:      General: She is not in acute distress.    Appearance: Normal appearance. She is not ill-appearing, toxic-appearing or diaphoretic.  HENT:     Mouth/Throat:     Mouth: Mucous membranes are moist.     Pharynx: Oropharynx is clear.  Eyes:     General: No scleral icterus.    Extraocular Movements: Extraocular movements intact.     Pupils: Pupils are equal, round, and reactive to light.  Cardiovascular:     Rate and Rhythm: Normal rate and regular rhythm.     Pulses: Normal pulses.     Heart sounds: Normal heart sounds.  Pulmonary:     Effort: Pulmonary effort is normal. No respiratory distress.     Breath sounds: Normal breath sounds. No stridor. No wheezing, rhonchi or rales.  Chest:       Chest wall: No tenderness.  Abdominal:     General: Abdomen is flat. There is no distension.     Palpations: Abdomen is soft.     Tenderness: There is no abdominal tenderness. There is no guarding or rebound.  Musculoskeletal:        General: No swelling or tenderness. Normal range of motion.     Cervical back: Normal range of motion and neck supple. No rigidity.     Right lower leg: No edema.     Left lower leg: Edema (Left leg with 1+ pitting edema from foot up until mid shin.  Warmth is noted throughout.  No true erythema noted.  No rashes noted.  Patient with normal strength to that leg and ankle and toes.  Normal sensation throughout.  PT and DP pulses 2+.) present.  Skin:    General: Skin is warm and dry.     Capillary Refill: Capillary refill takes less than 2 seconds.     Coloration: Skin is not pale.  Neurological:     General: No focal deficit present.     Mental Status: She is alert and oriented to person, place, and time.  Psychiatric:        Mood and Affect: Mood normal.        Behavior: Behavior normal.     ED Results / Procedures / Treatments   Labs (all labs ordered are listed, but only abnormal results are displayed) Labs Reviewed  CBC WITH DIFFERENTIAL/PLATELET - Abnormal; Notable for the following components:      Result Value   RBC 5.18 (*)    All other components within normal limits  COMPREHENSIVE METABOLIC PANEL - Abnormal; Notable for the following components:   Potassium 2.7 (*)    Glucose, Bld 121 (*)    All other components within normal limits    EKG None  Radiology US Venous Img Lower Unilateral Left  Result Date: 08/13/2019 CLINICAL DATA:  Left lower extremity EXAM: Left LOWER EXTREMITY VENOUS DOPPLER ULTRASOUND TECHNIQUE: Gray-scale sonography with compression, as  well as color and duplex ultrasound, were performed to evaluate the deep venous system(s) from the level of the common femoral vein through the popliteal and proximal calf  veins. COMPARISON:  None. FINDINGS: VENOUS Normal compressibility of the common femoral, superficial femoral, and popliteal veins, as well as the visualized calf veins. Visualized portions of profunda femoral vein and great saphenous vein unremarkable. No filling defects to suggest DVT on grayscale or color Doppler imaging. Doppler waveforms show normal direction of venous flow, normal respiratory plasticity and response to augmentation. Limited views of the contralateral common femoral vein are unremarkable. OTHER None. Limitations: none IMPRESSION: No evidence of deep venous thrombosis in the left lower extremity. Electronically Signed   By: Marijo Sanes M.D.   On: 08/13/2019 12:20    Procedures Procedures (including critical care time)  Medications Ordered in ED Medications  potassium chloride 10 mEq in 100 mL IVPB (10 mEq Intravenous New Bag/Given 08/13/19 1654)  magnesium sulfate IVPB 2 g 50 mL (2 g Intravenous New Bag/Given 08/13/19 1647)  ibuprofen (ADVIL) tablet 800 mg (800 mg Oral Given 08/13/19 1226)  potassium chloride SA (KLOR-CON) CR tablet 40 mEq (40 mEq Oral Given 08/13/19 1410)  fentaNYL (SUBLIMAZE) injection 50 mcg (50 mcg Intravenous Given 08/13/19 1400)    ED Course  I have reviewed the triage vital signs and the nursing notes.  Pertinent labs & imaging results that were available during my care of the patient were reviewed by me and considered in my medical decision making (see chart for details).    MDM Rules/Calculators/A&P                         [name] is a 44 y.o. female with pertinent past medical history of anemia, bipolar 1, sciatica, osteoarthritis that presents the emergency department today for L leg swelling.  Patient states that this is been occurring for the past month, the past 3 days she has had increasing pain.  CBC  without any acute abnormalities, no leukocytosis.  Patient is afebrile and nontachycardic.  CMP with potassium 2.7.  Was called by  nursing for this critical value.  Will start IV potassium, IV magnesium and oral potassium at this time.  This is most likely due to patient's diuretic use. States that she just started this last week.  EKG without any abnormalities.  Patient is not complaining of any chest pain or shortness of breath.   Ultrasound does not show any DVTs.  Pain is being controlled with fentanyl.  Will treat symptoms for cellulitis.  No streaking.  Patient to be discharged with antibiotics and outpatient follow-up.  Patient agreeable, states that she will follow up with her PCP.  Doubt need for further emergent work up at this time. I explained the diagnosis and have given explicit precautions to return to the ER including for any other new or worsening symptoms. The patient understands and accepts the medical plan as it's been dictated and I have answered their questions. Discharge instructions concerning home care and prescriptions have been given. The patient is STABLE and is discharged to home in good condition.  Patient to be given potassium until she can see her PCP and get that rechecked.  I discussed this case with my attending physician who cosigned this note including patient's presenting symptoms, physical exam, and planned diagnostics and interventions. Attending physician stated agreement with plan or made changes to plan which were implemented.    Final Clinical Impression(s) /  ED Diagnoses Final diagnoses:  Cellulitis of left lower extremity  Hypokalemia    Rx / DC Orders ED Discharge Orders         Ordered    doxycycline (VIBRAMYCIN) 100 MG capsule  2 times daily     Discontinue  Reprint     08/13/19 1315    potassium chloride (KLOR-CON) 10 MEQ tablet  Daily     Discontinue  Reprint     08/13/19 1613           Alfredia Client, PA-C 08/13/19 1811    Milton Ferguson, MD 08/15/19 [phone]

## 2019-08-13 NOTE — Discharge Instructions (Addendum)
You are seen today for cellulitis.  I want you to take the doxycycline as prescribed.  Make sure to stay at the sun while taking this antibiotic, you can wear sunscreen.  Use the attached instructions.  I want you to follow-up with your primary care in the next couple of days to make sure that the cellulitis is resolving.  You can use ibuprofen as prescribed on the bottle for pain.  Your potassium was low today, it is very important that you follow-up with your primary care in the next week to get your potassium rechecked.  Take your potassium pills as prescribed.  Use the attached instructions.  If you have any new or worsening concerning symptoms please come back to the emergency department.

## 2019-08-13 NOTE — ED Notes (Signed)
Reports L lower calf pain swelling for the last month   Painful ambulation   Has not seen PCP   Here for eval

## 2019-08-13 NOTE — ED Triage Notes (Signed)
Pt has been having left calf swelling that has been ongoing for the last month. She states it is warm to the touch. She has been using Sal[place] for pain, but no relief. No history of DVT's.

## 2019-08-13 NOTE — ED Notes (Signed)
Call from lab critical K 2.7

## 2020-03-15 ENCOUNTER — Other Ambulatory Visit: Payer: Self-pay | Admitting: Family Medicine

## 2020-03-15 DIAGNOSIS — N6489 Other specified disorders of breast: Secondary | ICD-10-CM

## 2020-08-11 ENCOUNTER — Emergency Department (HOSPITAL_COMMUNITY)
Admission: EM | Admit: 2020-08-11 | Discharge: 2020-08-12 | Discharge status: Against Medical Advice | Payer: 59 | Attending: Emergency Medicine | Admitting: Emergency Medicine

## 2020-08-11 ENCOUNTER — Other Ambulatory Visit: Payer: Self-pay

## 2020-08-11 DIAGNOSIS — F17210 Nicotine dependence, cigarettes, uncomplicated: Secondary | ICD-10-CM | POA: Diagnosis not present

## 2020-08-11 DIAGNOSIS — M79604 Pain in right leg: Secondary | ICD-10-CM | POA: Diagnosis not present

## 2020-08-11 DIAGNOSIS — G8929 Other chronic pain: Secondary | ICD-10-CM | POA: Insufficient documentation

## 2020-08-11 DIAGNOSIS — M545 Low back pain, unspecified: Secondary | ICD-10-CM | POA: Diagnosis not present

## 2020-08-11 DIAGNOSIS — R109 Unspecified abdominal pain: Secondary | ICD-10-CM

## 2020-08-11 DIAGNOSIS — Z79899 Other long term (current) drug therapy: Secondary | ICD-10-CM | POA: Insufficient documentation

## 2020-08-11 DIAGNOSIS — I10 Essential (primary) hypertension: Secondary | ICD-10-CM | POA: Diagnosis not present

## 2020-08-11 DIAGNOSIS — Z7982 Long term (current) use of aspirin: Secondary | ICD-10-CM | POA: Insufficient documentation

## 2020-08-11 NOTE — ED Triage Notes (Signed)
Pt c/o left flank pain x 2 weeks and states her urine smells like (poot).

## 2020-08-12 DIAGNOSIS — R109 Unspecified abdominal pain: Secondary | ICD-10-CM | POA: Diagnosis not present

## 2020-08-12 LAB — CBC WITH DIFFERENTIAL/PLATELET
Abs Immature Granulocytes: 0.02 10*3/uL (ref 0.00–0.07)
Basophils Absolute: 0 10*3/uL (ref 0.0–0.1)
Basophils Relative: 1 %
Eosinophils Absolute: 0.2 10*3/uL (ref 0.0–0.5)
Eosinophils Relative: 4 %
HCT: 40.4 % (ref 36.0–46.0)
Hemoglobin: 13.2 g/dL (ref 12.0–15.0)
Immature Granulocytes: 0 %
Lymphocytes Relative: 35 %
Lymphs Abs: 2.2 10*3/uL (ref 0.7–4.0)
MCH: 26.7 pg (ref 26.0–34.0)
MCHC: 32.7 g/dL (ref 30.0–36.0)
MCV: 81.6 fL (ref 80.0–100.0)
Monocytes Absolute: 0.4 10*3/uL (ref 0.1–1.0)
Monocytes Relative: 7 %
Neutro Abs: 3.5 10*3/uL (ref 1.7–7.7)
Neutrophils Relative %: 53 %
Platelets: 314 10*3/uL (ref 150–400)
RBC: 4.95 MIL/uL (ref 3.87–5.11)
RDW: 16 % — ABNORMAL HIGH (ref 11.5–15.5)
WBC: 6.4 10*3/uL (ref 4.0–10.5)
nRBC: 0 % (ref 0.0–0.2)

## 2020-08-12 LAB — URINALYSIS, ROUTINE W REFLEX MICROSCOPIC
Bacteria, UA: NONE SEEN
Bilirubin Urine: NEGATIVE
Glucose, UA: NEGATIVE mg/dL
Ketones, ur: 5 mg/dL — AB
Leukocytes,Ua: NEGATIVE
Nitrite: NEGATIVE
Protein, ur: NEGATIVE mg/dL
Specific Gravity, Urine: 1.03 (ref 1.005–1.030)
pH: 5 (ref 5.0–8.0)

## 2020-08-12 LAB — BASIC METABOLIC PANEL
Anion gap: 7 (ref 5–15)
BUN: 16 mg/dL (ref 6–20)
CO2: 28 mmol/L (ref 22–32)
Calcium: 8.7 mg/dL — ABNORMAL LOW (ref 8.9–10.3)
Chloride: 102 mmol/L (ref 98–111)
Creatinine, Ser: 0.78 mg/dL (ref 0.44–1.00)
GFR, Estimated: >60 mL/min (ref >60)
Glucose, Bld: 92 mg/dL (ref 70–99)
Potassium: 3.2 mmol/L — ABNORMAL LOW (ref 3.5–5.1)
Sodium: 137 mmol/L (ref 135–145)

## 2020-08-12 LAB — PREGNANCY, URINE: Preg Test, Ur: NEGATIVE

## 2020-08-12 MED ORDER — OXYCODONE HCL 5 MG PO TABS
5.0000 mg | ORAL_TABLET | Freq: Once | ORAL | Status: AC
  Administered 2020-08-12: 5 mg via ORAL
  Filled 2020-08-12: qty 1

## 2020-08-12 MED ORDER — ACETAMINOPHEN 500 MG PO TABS: 1000.0000 mg | ORAL_TABLET | Freq: Once | ORAL | Status: DC

## 2020-08-12 MED ORDER — ONDANSETRON 4 MG PO TBDP
4.0000 mg | ORAL_TABLET | Freq: Once | ORAL | Status: AC
  Administered 2020-08-12: 4 mg via ORAL
  Filled 2020-08-12: qty 1

## 2020-08-12 NOTE — ED Notes (Signed)
Pt ambulatory from room #[address] to [address] where spouse is getting medication from his nurse. Pt standing in hallway at this time.

## 2020-08-12 NOTE — ED Notes (Addendum)
Pt standing at nurses desk asking where her discharge papers are at-informed pt that she does not have discharge papers. Pt states, "why not" Informed pt that the Dr had ordered a CT scan to rule out a kidney stone and Dr Wyvonnia Dusky will not provide discharge papers when diagnostic tests are still pending.  Pt ambulatory back to room-

## 2020-08-12 NOTE — ED Provider Notes (Signed)
[facility] EMERGENCY DEPARTMENT Provider Note   CSN: [id number] Arrival date & time: 08/11/20  2131     History Chief Complaint  Patient presents with   Flank Pain    [name] is a 45 y.o. female.  Patient with history of chronic back pain, bipolar disorder, fibromyalgia, hypertension here with right-sided flank pain.  States pain has been ongoing for the past 1 week or more.  Associated with foul-smelling urine on occasion.  States when she urinates she "smells farts" but not every time she urinates.  Is not painful to urinate.  There is no noticed blood in her urine.  No fever or vomiting.  Pain is in her right flank and radiates down her right leg.  Has chronic back pain with a spinal cord stimulator.  Denies any new weakness, numbness or tingling.  No bowel or bladder incontinence.  No fever or vomiting. States she is never had pain like this before.  Concerned she could have a kidney stone. Denies any history of IV drug abuse or cancer.  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  Flank Pain Pertinent negatives include no chest pain, no abdominal pain, no headaches and no shortness of breath.      Past Medical History:  Diagnosis Date   Anemia    Bipolar 1 disorder ([place])    Fibromyalgia    GERD (gastroesophageal reflux disease)    History of degenerative disc disease    Hypertension    Manic depression ([place])    osteoarthritis    osteoarthritis   Sciatica     Patient Active Problem List   Diagnosis Date Noted   S/P endometrial ablation 02/04/2018   Subclinical hyperthyroidism 11/27/2017   DUB (dysfunctional uterine bleeding) 11/27/2017   Morbid obesity with BMI of 45.0-49.9, adult ([place]) 11/20/2017   Abnormal thyroid blood test 11/20/2017   Menorrhagia with regular cycle 11/20/2017   Iron deficiency anemia 07/02/2017   GERD (gastroesophageal reflux disease) 11/13/2016    Nausea with vomiting 11/13/2016   Rectal bleeding 11/13/2016   Fecal incontinence 11/13/2016   Early satiety 11/13/2016   Post laminectomy syndrome 08/16/2015    Past Surgical History:  Procedure Laterality Date   ANKLE SURGERY Left    ORIF   BACK SURGERY     x3   CHOLECYSTECTOMY  1998   Dr. Anthony Sar   COLONOSCOPY WITH PROPOFOL N/A 02/19/2019   Procedure: COLONOSCOPY WITH PROPOFOL;  Surgeon: Toledo, Benay Pike, MD;  Location: [facility] ENDOSCOPY;  Service: Gastroenterology;  Laterality: N/A;   ERCP  2005   Dr. Laural Golden: s/p sphincerotomy with removal of sludge and debris   ESOPHAGOGASTRODUODENOSCOPY (EGD) WITH PROPOFOL N/A 02/19/2019   Procedure: ESOPHAGOGASTRODUODENOSCOPY (EGD) WITH PROPOFOL;  Surgeon: Toledo, Benay Pike, MD;  Location: [facility] ENDOSCOPY;  Service: Gastroenterology;  Laterality: N/A;   HYSTEROSCOPY WITH D & C N/A 01/26/2018   Procedure: DILATATION AND CURETTAGE /HYSTEROSCOPY WITH MINERVA;  Surgeon: Rubie Maid, MD;  Location: [facility] ORS;  Service: Gynecology;  Laterality: N/A;   LUMBAR WOUND DEBRIDEMENT N/A 08/16/2015   Procedure: Spinal Cord Stimulator Revision;  Surgeon: Ashok Pall, MD;  Location: [place] [facility] OR[facility];  Service: Neurosurgery;  Laterality: N/A;   SPINAL CORD STIMULATOR INSERTION N/A 08/16/2015   Procedure: LUMBAR SPINAL CORD STIMULATOR INSERTION;  Surgeon: Ashok Pall, MD;  Location: [place] [facility];  Service: Neurosurgery;  Laterality: N/A;  LUMBAR SPINAL CORD STIMULATOR INSERTION  TUBAL LIGATION     UMBILICAL HERNIA REPAIR  1995   [facility]     OB History     Gravida  2   Para  2   Term  2   Preterm      AB      Living  2      SAB      IAB      Ectopic      Multiple      Live Births  2           Family History  Problem Relation Age of Onset   Crohn's disease Maternal Aunt    Rheum arthritis Mother    Diabetes Mother    Clotting disorder Mother    Breast cancer Mother 51   Cancer Mother 93       breast (may have been  precancerous)   Colon cancer Neg Hx     Social History   Tobacco Use   Smoking status: Every Day    Packs/day: 1.00    Years: 20.00    Pack years: 20.00    Types: Cigarettes   Smokeless tobacco: Never  Vaping Use   Vaping Use: Never used  Substance Use Topics   Alcohol use: No   Drug use: Yes    Types: Marijuana    Comment: none since 07/2016    Home Medications Prior to Admission medications   Medication Sig Start Date End Date Taking? Authorizing Provider  ALPRAZolam Duanne Moron) 1 MG tablet Take 1 mg by mouth 3 (three) times daily as needed for anxiety.    [provider]  amitriptyline (ELAVIL) 150 MG tablet Take 1 tablet by mouth at bedtime. 10/21/16   [provider]  Aspirin-Acetaminophen-Caffeine (EXCEDRIN PO) Take 1-2 tablets by mouth 2 (two) times daily as needed (headaches).     [provider]  baclofen (LIORESAL) 10 MG tablet  11/25/17   [provider]  Buprenorphine HCl-Naloxone HCl (SUBOXONE) 8-2 MG FILM Place 1 Film under the tongue 3 (three) times daily.    [provider]  DEXILANT 60 MG capsule Take 60 mg by mouth daily. 07/04/15   [provider]  doxycycline (VIBRAMYCIN) 100 MG capsule Take 1 capsule (100 mg total) by mouth 2 (two) times daily. 08/13/19   Alfredia Client, PA-C  gabapentin (NEURONTIN) 600 MG tablet Take 600 mg by mouth 2 (two) times daily.  04/27/17   [provider]  Iron-Vitamin C 65-125 MG TABS Take 1 tablet by mouth daily. 03/30/18   Earlie Server, MD  lisinopril-hydrochlorothiazide (PRINZIDE,ZESTORETIC) 20-25 MG tablet Take 1 tablet by mouth daily. 09/24/16   [provider]  potassium chloride (KLOR-CON) 10 MEQ tablet Take 2 tablets (20 mEq total) by mouth daily for 10 days. 08/13/19 08/23/19  Alfredia Client, PA-C  umeclidinium-vilanterol (ANORO ELLIPTA) 62.5-25 MCG/INH AEPB Inhale 1 puff into the lungs daily.    [provider]  varenicline (CHANTIX) 0.5 MG tablet Take 1 tablet (0.5  mg total) by mouth 2 (two) times daily. Patient not taking: Reported on 12/22/2018 05/10/17   Mesner, Corene Cornea, MD    Allergies    Vicodin [hydrocodone-acetaminophen], Acetaminophen, Hydrocil [psyllium], and Percocet [oxycodone-acetaminophen]  Review of Systems   Review of Systems  Constitutional:  Negative for activity change, appetite change and fever.  HENT:  Negative for congestion.   Respiratory:  Negative for cough, chest tightness and shortness of breath.   Cardiovascular:  Negative for chest pain.  Gastrointestinal:  Negative for abdominal pain, nausea and vomiting.  Genitourinary:  Positive for flank pain and hematuria. Negative for decreased urine volume, dysuria, urgency, vaginal bleeding and vaginal discharge.  Musculoskeletal:  Positive for back pain. Negative for arthralgias and myalgias.  Skin:  Negative for rash.  Neurological:  Negative for dizziness, weakness and headaches.   all other systems are negative except as noted in the HPI and PMH.   Physical Exam Updated Vital Signs BP 108/72   Pulse 85   Temp 98.5 F (36.9 C)   Resp 17   Ht '5\' 2"'$  (1.575 m)   Wt 108.9 kg   SpO2 97%   BMI 43.90 kg/m   Physical Exam Vitals and nursing note reviewed.  Constitutional:      General: She is not in acute distress.    Appearance: She is well-developed. She is obese.  HENT:     Head: Normocephalic and atraumatic.     Mouth/Throat:     Pharynx: No oropharyngeal exudate.  Eyes:     Conjunctiva/sclera: Conjunctivae normal.     Pupils: Pupils are equal, round, and reactive to light.  Neck:     Comments: No meningismus. Cardiovascular:     Rate and Rhythm: Normal rate and regular rhythm.     Heart sounds: Normal heart sounds. No murmur heard. Pulmonary:     Effort: Pulmonary effort is normal. No respiratory distress.     Breath sounds: Normal breath sounds.  Abdominal:     Palpations: Abdomen is soft.     Tenderness: There is no abdominal tenderness. There is no  guarding or rebound.  Musculoskeletal:        General: Tenderness present. Normal range of motion.     Cervical back: Normal range of motion and neck supple.     Comments: R CVAT  5/5 strength in bilateral lower extremities. Ankle plantar and dorsiflexion intact. Great toe extension intact bilaterally. +2 DP and PT pulses. +2 patellar reflexes bilaterally. Normal gait.   Skin:    General: Skin is warm.  Neurological:     Mental Status: She is alert and oriented to person, place, and time.     Cranial Nerves: No cranial nerve deficit.     Motor: No abnormal muscle tone.     Coordination: Coordination normal.     Comments:  5/5 strength throughout. CN 2-12 intact.Equal grip strength.   Psychiatric:        Behavior: Behavior normal.    ED Results / Procedures / Treatments   Labs (all labs ordered are listed, but only abnormal results are displayed) Labs Reviewed  URINALYSIS, ROUTINE W REFLEX MICROSCOPIC - Abnormal; Notable for the following components:      Result Value   APPearance HAZY (*)    Hgb urine dipstick SMALL (*)    Ketones, ur 5 (*)    All other components within normal limits  CBC WITH DIFFERENTIAL/PLATELET - Abnormal; Notable for the following components:   RDW 16.0 (*)    All other components within normal limits  BASIC METABOLIC PANEL - Abnormal; Notable for the following components:   Potassium 3.2 (*)    Calcium 8.7 (*)    All other components within normal limits  URINE CULTURE  PREGNANCY, URINE    EKG None  Radiology No results found.  Procedures Procedures   Medications Ordered in ED Medications  oxyCODONE (Oxy IR/ROXICODONE) immediate release tablet 5 mg (has no administration in time range)  ondansetron (ZOFRAN-ODT) disintegrating tablet 4 mg (has  no administration in time range)    ED Course  I have reviewed the triage vital signs and the nursing notes.  Pertinent labs & imaging results that were available during my care of the patient  were reviewed by me and considered in my medical decision making (see chart for details).    MDM Rules/Calculators/A&P                          Right-sided flank pain for the past 2 weeks without fall or trauma.  Does have foul-smelling urine.  No focal weakness, numbness or tingling. She has chronic pain issues and takes Suboxone at home.  Low suspicion for cord compression or cauda equina.  UA with small hemoglobin but otherwise not impressive for infection.  Will send for culture.  Plan to obtain CT scan to rule out kidney stone.  Patient became frustrated with wait and decided to leave [place]. She appears to have capacity to make this decision. Final Clinical Impression(s) / ED Diagnoses Final diagnoses:  None    Rx / DC Orders ED Discharge Orders     None        Tiffine Henigan, Annie Main, MD 08/12/20 [phone]

## 2020-08-12 NOTE — ED Notes (Signed)
Unable to obtain pt vitals as pt is standing in hallway next to spouse stretcher.

## 2020-08-12 NOTE — ED Notes (Signed)
Pt made aware of need for urine sample.  

## 2020-08-12 NOTE — ED Notes (Signed)
Pt calls this nurse to room and states, "Will you tell the Dr if he given me another pain pill or shot, I will stay for the scan". Dr Wyvonnia Dusky informed of this- instructed to offer the pt Tylenol.

## 2020-08-12 NOTE — ED Notes (Signed)
Pt states, "I have tylenol at home, can he just discharge me" Informed pt that she is free to leave but she would be leaving Against medical advice.

## 2020-08-12 NOTE — ED Notes (Cosign Needed)
Pt states, "that little roxy pill didn't do anything for me" Dr Wyvonnia Dusky made aware.

## 2020-08-12 NOTE — ED Notes (Signed)
Pt did not allow this nurse to obtain vitals- pt did not e-sign AMA-pt ambulatory at this time, standing next to spouse, who is a patient in [address]

## 2020-08-12 NOTE — ED Notes (Addendum)
PT STATING SHE WANTS TO LEAVE-PT WANTING SOMETHING FOR PAIN- PT SPOUSE IS HERE ALSO, REQUESTED THAT I TELL SPOUSE SHE WANTS TO LEAVE-PT SPOUSE TO ROOM TO TALK WITH PT

## 2020-08-12 NOTE — ED Notes (Addendum)
WC brought to room per spouse request-Pt informed she was ordered pain medication- pt had decided to stay now

## 2020-08-13 LAB — URINE CULTURE: Culture: <10000 — AB

## 2021-03-06 ENCOUNTER — Other Ambulatory Visit: Payer: Self-pay | Admitting: Family Medicine

## 2021-03-06 DIAGNOSIS — N6489 Other specified disorders of breast: Secondary | ICD-10-CM

## 2021-03-22 ENCOUNTER — Ambulatory Visit
Admission: RE | Admit: 2021-03-22 | Discharge: 2021-03-22 | Disposition: A | Discharge status: Home / Self Care | Payer: 59 | Source: Ambulatory Visit | Attending: Family Medicine | Admitting: Family Medicine

## 2021-03-22 ENCOUNTER — Other Ambulatory Visit: Payer: Self-pay

## 2021-03-22 DIAGNOSIS — N6489 Other specified disorders of breast: Secondary | ICD-10-CM | POA: Diagnosis present

## 2021-10-31 ENCOUNTER — Other Ambulatory Visit: Payer: Self-pay

## 2021-10-31 ENCOUNTER — Emergency Department (HOSPITAL_COMMUNITY)
Admission: EM | Admit: 2021-10-31 | Discharge: 2021-10-31 | Discharge status: Against Medical Advice | Payer: 59 | Attending: Emergency Medicine | Admitting: Emergency Medicine

## 2021-10-31 ENCOUNTER — Encounter (HOSPITAL_COMMUNITY): Payer: Self-pay | Admitting: Emergency Medicine

## 2021-10-31 DIAGNOSIS — R109 Unspecified abdominal pain: Secondary | ICD-10-CM | POA: Diagnosis present

## 2021-10-31 DIAGNOSIS — Z5321 Procedure and treatment not carried out due to patient leaving prior to being seen by health care provider: Secondary | ICD-10-CM | POA: Insufficient documentation

## 2021-10-31 LAB — COMPREHENSIVE METABOLIC PANEL
ALT: 23 U/L (ref 0–44)
AST: 22 U/L (ref 15–41)
Albumin: 4.1 g/dL (ref 3.5–5.0)
Alkaline Phosphatase: 70 U/L (ref 38–126)
Anion gap: 9 (ref 5–15)
BUN: 17 mg/dL (ref 6–20)
CO2: 29 mmol/L (ref 22–32)
Calcium: 9.2 mg/dL (ref 8.9–10.3)
Chloride: 101 mmol/L (ref 98–111)
Creatinine, Ser: 0.85 mg/dL (ref 0.44–1.00)
GFR, Estimated: >60 mL/min (ref >60)
Glucose, Bld: 104 mg/dL — ABNORMAL HIGH (ref 70–99)
Potassium: 3.2 mmol/L — ABNORMAL LOW (ref 3.5–5.1)
Sodium: 139 mmol/L (ref 135–145)
Total Bilirubin: 0.3 mg/dL (ref 0.3–1.2)
Total Protein: 7.7 g/dL (ref 6.5–8.1)

## 2021-10-31 LAB — CBC
HCT: 45.8 % (ref 36.0–46.0)
Hemoglobin: 14.3 g/dL (ref 12.0–15.0)
MCH: 24.3 pg — ABNORMAL LOW (ref 26.0–34.0)
MCHC: 31.2 g/dL (ref 30.0–36.0)
MCV: 77.9 fL — ABNORMAL LOW (ref 80.0–100.0)
Platelets: 370 10*3/uL (ref 150–400)
RBC: 5.88 MIL/uL — ABNORMAL HIGH (ref 3.87–5.11)
RDW: 17.5 % — ABNORMAL HIGH (ref 11.5–15.5)
WBC: 9 10*3/uL (ref 4.0–10.5)
nRBC: 0 % (ref 0.0–0.2)

## 2021-10-31 LAB — HCG, SERUM, QUALITATIVE: Preg, Serum: NEGATIVE

## 2021-10-31 LAB — LIPASE, BLOOD: Lipase: 29 U/L (ref 11–51)

## 2021-10-31 NOTE — ED Triage Notes (Signed)
Right sided flank pain x 3 days. Denies urinary symptoms.

## 2021-11-02 ENCOUNTER — Encounter (HOSPITAL_COMMUNITY): Payer: Self-pay

## 2021-11-02 ENCOUNTER — Emergency Department (HOSPITAL_COMMUNITY)
Admission: EM | Admit: 2021-11-02 | Discharge: 2021-11-02 | Disposition: A | Discharge status: Home / Self Care | Payer: 59 | Attending: Emergency Medicine | Admitting: Emergency Medicine

## 2021-11-02 ENCOUNTER — Emergency Department (HOSPITAL_COMMUNITY): Payer: 59

## 2021-11-02 ENCOUNTER — Other Ambulatory Visit: Payer: Self-pay

## 2021-11-02 DIAGNOSIS — S61412A Laceration without foreign body of left hand, initial encounter: Secondary | ICD-10-CM | POA: Insufficient documentation

## 2021-11-02 DIAGNOSIS — Z23 Encounter for immunization: Secondary | ICD-10-CM | POA: Diagnosis not present

## 2021-11-02 DIAGNOSIS — M545 Low back pain, unspecified: Secondary | ICD-10-CM | POA: Diagnosis not present

## 2021-11-02 DIAGNOSIS — W540XXA Bitten by dog, initial encounter: Secondary | ICD-10-CM | POA: Insufficient documentation

## 2021-11-02 DIAGNOSIS — R109 Unspecified abdominal pain: Secondary | ICD-10-CM

## 2021-11-02 DIAGNOSIS — S6992XA Unspecified injury of left wrist, hand and finger(s), initial encounter: Secondary | ICD-10-CM | POA: Diagnosis present

## 2021-11-02 LAB — CBC
HCT: 44.3 % (ref 36.0–46.0)
Hemoglobin: 14.2 g/dL (ref 12.0–15.0)
MCH: 24.3 pg — ABNORMAL LOW (ref 26.0–34.0)
MCHC: 32.1 g/dL (ref 30.0–36.0)
MCV: 75.9 fL — ABNORMAL LOW (ref 80.0–100.0)
Platelets: 334 10*3/uL (ref 150–400)
RBC: 5.84 MIL/uL — ABNORMAL HIGH (ref 3.87–5.11)
RDW: 16.2 % — ABNORMAL HIGH (ref 11.5–15.5)
WBC: 8.4 10*3/uL (ref 4.0–10.5)
nRBC: 0 % (ref 0.0–0.2)

## 2021-11-02 LAB — LIPASE, BLOOD: Lipase: 27 U/L (ref 11–51)

## 2021-11-02 LAB — COMPREHENSIVE METABOLIC PANEL
ALT: 22 U/L (ref 0–44)
AST: 20 U/L (ref 15–41)
Albumin: 3.7 g/dL (ref 3.5–5.0)
Alkaline Phosphatase: 72 U/L (ref 38–126)
Anion gap: 7 (ref 5–15)
BUN: 14 mg/dL (ref 6–20)
CO2: 29 mmol/L (ref 22–32)
Calcium: 9 mg/dL (ref 8.9–10.3)
Chloride: 99 mmol/L (ref 98–111)
Creatinine, Ser: 0.79 mg/dL (ref 0.44–1.00)
GFR, Estimated: >60 mL/min (ref >60)
Glucose, Bld: 105 mg/dL — ABNORMAL HIGH (ref 70–99)
Potassium: 3.4 mmol/L — ABNORMAL LOW (ref 3.5–5.1)
Sodium: 135 mmol/L (ref 135–145)
Total Bilirubin: 0.4 mg/dL (ref 0.3–1.2)
Total Protein: 7.2 g/dL (ref 6.5–8.1)

## 2021-11-02 MED ORDER — AMOXICILLIN-POT CLAVULANATE 875-125 MG PO TABS
1.0000 | ORAL_TABLET | Freq: Once | ORAL | Status: AC
  Administered 2021-11-02: 1 via ORAL
  Filled 2021-11-02: qty 1

## 2021-11-02 MED ORDER — TETANUS-DIPHTH-ACELL PERTUSSIS 5-2.5-18.5 LF-MCG/0.5 IM SUSY
0.5000 mL | PREFILLED_SYRINGE | Freq: Once | INTRAMUSCULAR | Status: AC
  Administered 2021-11-02: 0.5 mL via INTRAMUSCULAR
  Filled 2021-11-02: qty 0.5

## 2021-11-02 MED ORDER — AMOXICILLIN-POT CLAVULANATE 875-125 MG PO TABS: 1.0000 | ORAL_TABLET | Freq: Two times a day (BID) | ORAL | 0 refills | Status: DC

## 2021-11-02 MED ORDER — NAPROXEN 500 MG PO TABS: 500.0000 mg | ORAL_TABLET | Freq: Two times a day (BID) | ORAL | 0 refills | Status: DC

## 2021-11-02 NOTE — ED Triage Notes (Signed)
Dog bite a few hours ago. Bite to the back of left leg.   Pt states her friend neighbor dog bit her. Per pt the dog was up to date on shots.   Also complain of left side pain and back pain.

## 2021-11-02 NOTE — ED Provider Triage Note (Signed)
Emergency Medicine Provider Triage Evaluation Note  [name] , a 46 y.o. female  was evaluated in triage.  Pt complains of dog bite to left forearm and left posterior thigh which occurred around 10 AM this morning.  It was not neighbor dog of a friend she was visiting in [place].  The neighbor states the dog is current with his vaccines.  Patient continues to have bleeding from the wound sites.  Not current with tetanus.  Also has complaints of right flank and lower back pain, worse with movement, has chronic low back pain.  Was here yesterday evening with this complaints but left before being seen.  Denies dysuria, fevers or chills.   Review of Systems  Positive: Dog bites, low back pain Negative: Fever, dysuria  Physical Exam  BP 113/85 (BP Location: Right Arm)   Pulse (!) 101   Temp 98.1 F (36.7 C) (Oral)   Resp 18   Ht 5' (1.524 m)   Wt 127.1 kg   SpO2 97%   BMI 54.73 kg/m  Gen:   Awake, no distress   Resp:  Normal effort  MSK:   Moves extremities without difficulty  Other:  Laceration left dorsal forearm, puncture wounds left distal posterior thigh.  Medical Decision Making  Medically screening exam initiated at 6:06 PM.  Appropriate orders placed.  [name] was informed that the remainder of the evaluation will be completed by another provider, this initial triage assessment does not replace that evaluation, and the importance of remaining in the ED until their evaluation is complete.  Labs and imaging, tetanus vaccine ordered.  Report to animal control ordered.   Kathleen Jefferson, PA-C 11/02/21 1811

## 2021-11-02 NOTE — ED Provider Notes (Signed)
[facility] EMERGENCY DEPARTMENT Provider Note   CSN: [id number] Arrival date & time: 11/02/21  1638     History  Chief Complaint  Patient presents with   Animal Bite    [name] is a 46 y.o. female.   Animal Bite Pt with dog bite that occurred 10 hours ago - neighbors dog - bit her on the left forearm.  As well as the left leg posteriorly, this is a known dog to this person, the people at the facility that this dog was and stated that they were up-to-date on vaccinations, police is involved as well as animal control according to the patient.  She is not up-to-date on tetanus, she still has a small amount of oozing from the leg wounds, she has not taken any medications prior to arrival.  She also complains of pain in her right lower back which is worse when she bends or twist, it is been present for 3 days, no urinary symptoms, no nausea or vomiting, no diarrhea or constipation, no difficulty breathing, this is not present unless she is moving.  It is not worse with eating.     Home Medications Prior to Admission medications   Medication Sig Start Date End Date Taking? Authorizing Provider  amoxicillin-clavulanate (AUGMENTIN) 875-125 MG tablet Take 1 tablet by mouth every 12 (twelve) hours. 11/02/21  Yes Noemi Chapel, MD  naproxen (NAPROSYN) 500 MG tablet Take 1 tablet (500 mg total) by mouth 2 (two) times daily with a meal. 11/02/21  Yes Noemi Chapel, MD  ALPRAZolam Duanne Moron) 1 MG tablet Take 1 mg by mouth 3 (three) times daily as needed for anxiety.    [provider]  amitriptyline (ELAVIL) 150 MG tablet Take 1 tablet by mouth at bedtime. 10/21/16   [provider]  Aspirin-Acetaminophen-Caffeine (EXCEDRIN PO) Take 1-2 tablets by mouth 2 (two) times daily as needed (headaches).     [provider]  baclofen (LIORESAL) 10 MG tablet  11/25/17   [provider]  Buprenorphine HCl-Naloxone HCl 8-2 MG FILM Place 1 Film under the tongue 3 (three)  times daily.    [provider]  DEXILANT 60 MG capsule Take 60 mg by mouth daily. 07/04/15   [provider]  doxycycline (VIBRAMYCIN) 100 MG capsule Take 1 capsule (100 mg total) by mouth 2 (two) times daily. 08/13/19   Alfredia Client, PA-C  gabapentin (NEURONTIN) 600 MG tablet Take 600 mg by mouth 2 (two) times daily.  04/27/17   [provider]  Iron-Vitamin C 65-125 MG TABS Take 1 tablet by mouth daily. 03/30/18   Earlie Server, MD  lisinopril-hydrochlorothiazide (PRINZIDE,ZESTORETIC) 20-25 MG tablet Take 1 tablet by mouth daily. 09/24/16   [provider]  potassium chloride (KLOR-CON) 10 MEQ tablet Take 2 tablets (20 mEq total) by mouth daily for 10 days. 08/13/19 08/23/19  Alfredia Client, PA-C  umeclidinium-vilanterol (ANORO ELLIPTA) 62.5-25 MCG/INH AEPB Inhale 1 puff into the lungs daily.    [provider]  varenicline (CHANTIX) 0.5 MG tablet Take 1 tablet (0.5 mg total) by mouth 2 (two) times daily. Patient not taking: Reported on 12/22/2018 05/10/17   Mesner, Corene Cornea, MD      Allergies    Vicodin [hydrocodone-acetaminophen], Acetaminophen, Hydrocil [psyllium], and Percocet [oxycodone-acetaminophen]    Review of Systems   Review of Systems  All other systems reviewed and are negative.   Physical Exam Updated Vital Signs BP 116/82   Pulse 89   Temp 98.5 F (36.9 C) (Oral)  Resp 18   Ht 1.524 m (5')   Wt 127.1 kg   SpO2 95%   BMI 54.73 kg/m  Physical Exam Vitals and nursing note reviewed.  Constitutional:      General: She is not in acute distress.    Appearance: She is well-developed.  HENT:     Head: Normocephalic and atraumatic.     Mouth/Throat:     Pharynx: No oropharyngeal exudate.  Eyes:     General: No scleral icterus.       Right eye: No discharge.        Left eye: No discharge.     Conjunctiva/sclera: Conjunctivae normal.     Pupils: Pupils are equal, round, and reactive to light.  Neck:     Thyroid: No thyromegaly.      Vascular: No JVD.  Cardiovascular:     Rate and Rhythm: Normal rate and regular rhythm.     Heart sounds: Normal heart sounds. No murmur heard.    No friction rub. No gallop.  Pulmonary:     Effort: Pulmonary effort is normal. No respiratory distress.     Breath sounds: Normal breath sounds. No wheezing or rales.  Abdominal:     General: Bowel sounds are normal. There is no distension.     Palpations: Abdomen is soft. There is no mass.     Tenderness: There is no abdominal tenderness.  Musculoskeletal:        General: No tenderness. Normal range of motion.     Cervical back: Normal range of motion and neck supple.  Lymphadenopathy:     Cervical: No cervical adenopathy.  Skin:    General: Skin is warm and dry.     Findings: No erythema or rash.     Comments: Laceration about 4 cm to the left forearm, this is nonbleeding, open, no signs of foreign body on investigation.  There is several puncture wounds to the back of the left thigh, 1 to 2 mm in diameter each, small amount of ooze and bruising as well  Neurological:     General: No focal deficit present.     Mental Status: She is alert.     Coordination: Coordination normal.  Psychiatric:        Behavior: Behavior normal.     ED Results / Procedures / Treatments   Labs (all labs ordered are listed, but only abnormal results are displayed) Labs Reviewed  COMPREHENSIVE METABOLIC PANEL - Abnormal; Notable for the following components:      Result Value   Potassium 3.4 (*)    Glucose, Bld 105 (*)    All other components within normal limits  CBC - Abnormal; Notable for the following components:   RBC 5.84 (*)    MCV 75.9 (*)    MCH 24.3 (*)    RDW 16.2 (*)    All other components within normal limits  LIPASE, BLOOD  URINALYSIS, ROUTINE W REFLEX MICROSCOPIC  PREGNANCY, URINE    EKG None  Radiology DG Femur Min 2 Views Left  Result Date: 11/02/2021 CLINICAL DATA:  Dog bite to the left distal femur and left mid  forearm. EXAM: LEFT FEMUR 2 VIEWS COMPARISON:  None Available. FINDINGS: There is no evidence of fracture or other focal bone lesions. Soft tissues are unremarkable. No radiopaque soft tissue foreign bodies. No apparent soft tissue gas. IMPRESSION: Negative. Electronically Signed   By: Lucienne Capers M.D.   On: 11/02/2021 18:46   DG Forearm Left  Result Date:  11/02/2021 CLINICAL DATA:  Dog bite EXAM: LEFT FOREARM - 2 VIEW COMPARISON:  None Available. FINDINGS: No fracture or dislocation is seen. There is subcutaneous stranding along the dorsal lateral aspect of distal forearm. There are no opaque foreign bodies. IMPRESSION: No fracture or dislocation is seen in left forearm. Electronically Signed   By: Elmer Picker M.D.   On: 11/02/2021 18:45    Procedures Procedures    Medications Ordered in ED Medications  Tdap (BOOSTRIX) injection 0.5 mL (0.5 mLs Intramuscular Given 11/02/21 1957)  amoxicillin-clavulanate (AUGMENTIN) 875-125 MG per tablet 1 tablet (1 tablet Oral Given 11/02/21 1957)    ED Course/ Medical Decision Making/ A&P                           Medical Decision Making Amount and/or Complexity of Data Reviewed Labs: ordered.  Risk Prescription drug management.   This patient presents to the ED for concern of abdominal pain as well as a dog bite differential diagnosis includes muscular pain of the back, the labs are unremarkable including CBC and metabolic panel, lipase is normal as well.  The dog wounds appear to be open, I do not want to suture them given the length of time they have been there and the puncture like nature on the leg.  They will be treated with a sterile dressing and antibiotic, the patient is agreeable, she has been updated on tetanus    Additional history obtained:  Additional history obtained from electronic medical record External records from outside source obtained and reviewed including multiple prior office visits for minor issues, no  recent evaluations in the emergency department other than a flank pain visit in July 2022 where nothing specific was found at that time.  She was admitted to the hospital a couple of years ago for an EGD, no other recent admissions.   Lab Tests:  I Ordered, and personally interpreted labs.  The pertinent results include: CBC metabolic panel and lipase normal, urine pending Patient refuses urine sample, wants to go home   Imaging Studies ordered:  I ordered imaging studies including x-rays of the forearm and the leg, no signs of foreign bodies I independently visualized and interpreted imaging which showed no fractures or foreign bodies I agree with the radiologist interpretation   Medicines ordered and prescription drug management:  I ordered medication including Augmentin and tetanus for dog bite Reevaluation of the patient after these medicines showed that the patient improved I have reviewed the patients home medicines and have made adjustments as needed   Problem List / ED Course:  Animal control involved Patient well-appearing No signs of abdominal tenderness on exam Suspect flank pain is muscular given the only time it is there is when she is bending or twisting   Social Determinants of Health:  None  I have discussed with the patient at the bedside the results, and the meaning of these results.  They have expressed her understanding to the need for follow-up with primary care physician           Final Clinical Impression(s) / ED Diagnoses Final diagnoses:  Dog bite, initial encounter  Acute right flank pain    Rx / DC Orders ED Discharge Orders          Ordered    amoxicillin-clavulanate (AUGMENTIN) 875-125 MG tablet  Every 12 hours        11/02/21 2113    naproxen (NAPROSYN) 500 MG tablet  2 times daily with meals        11/02/21 2113              Noemi Chapel, MD 11/02/21 2114

## 2021-11-02 NOTE — Discharge Instructions (Signed)
The rest of your work-up was not completed as you were not able to give [place] a urine sample and wanted to go home.  You may return at any time should you change your mind but all of your testing tonight has been reassuring.  You may take Naprosyn twice a day as needed for pain  Please take Augmentin twice a day for potential infection and the dog bites  This will cause pain swelling and drainage over the next several days but should gradually improve.  Please come back for severe or worsening pain swelling or fever.  Thank you for allowing [place] to treat you in the emergency department today.  After reviewing your examination and potential testing that was done it appears that you are safe to go home.  I would like for you to follow-up with your doctor within the next several days, have them obtain your results and follow-up with them to review all of these tests.  If you should develop severe or worsening symptoms return to the emergency department immediately

## 2021-11-02 NOTE — ED Notes (Signed)
Animal control notified of dog bite.

## 2022-03-20 ENCOUNTER — Other Ambulatory Visit: Payer: Self-pay | Admitting: Family Medicine

## 2022-03-20 DIAGNOSIS — Z1231 Encounter for screening mammogram for malignant neoplasm of breast: Secondary | ICD-10-CM

## 2022-04-02 ENCOUNTER — Encounter: Payer: Self-pay | Admitting: Oncology

## 2022-04-09 ENCOUNTER — Ambulatory Visit
Admission: RE | Admit: 2022-04-09 | Discharge: 2022-04-09 | Disposition: A | Discharge status: Home / Self Care | Payer: 59 | Source: Ambulatory Visit | Attending: Family Medicine | Admitting: Family Medicine

## 2022-04-09 DIAGNOSIS — Z1231 Encounter for screening mammogram for malignant neoplasm of breast: Secondary | ICD-10-CM | POA: Diagnosis present

## 2023-08-28 ENCOUNTER — Encounter: Payer: Self-pay | Admitting: Oncology

## 2023-09-04 ENCOUNTER — Encounter: Payer: Self-pay | Admitting: Oncology

## 2023-09-11 ENCOUNTER — Inpatient Hospital Stay: Attending: Oncology | Admitting: Oncology

## 2023-09-11 ENCOUNTER — Encounter: Payer: Self-pay | Admitting: Oncology

## 2023-09-11 ENCOUNTER — Ambulatory Visit: Payer: Self-pay | Admitting: Oncology

## 2023-09-11 ENCOUNTER — Inpatient Hospital Stay

## 2023-09-11 VITALS — BP 130/82 | HR 103 | Temp 97.0°F | Resp 18 | Wt 246.0 lb

## 2023-09-11 DIAGNOSIS — D509 Iron deficiency anemia, unspecified: Secondary | ICD-10-CM | POA: Insufficient documentation

## 2023-09-11 DIAGNOSIS — Z7982 Long term (current) use of aspirin: Secondary | ICD-10-CM | POA: Diagnosis not present

## 2023-09-11 DIAGNOSIS — Z803 Family history of malignant neoplasm of breast: Secondary | ICD-10-CM | POA: Diagnosis not present

## 2023-09-11 DIAGNOSIS — Z79899 Other long term (current) drug therapy: Secondary | ICD-10-CM | POA: Insufficient documentation

## 2023-09-11 DIAGNOSIS — E0590 Thyrotoxicosis, unspecified without thyrotoxic crisis or storm: Secondary | ICD-10-CM | POA: Insufficient documentation

## 2023-09-11 DIAGNOSIS — F17210 Nicotine dependence, cigarettes, uncomplicated: Secondary | ICD-10-CM | POA: Diagnosis not present

## 2023-09-11 DIAGNOSIS — Z8719 Personal history of other diseases of the digestive system: Secondary | ICD-10-CM | POA: Diagnosis not present

## 2023-09-11 DIAGNOSIS — Z7984 Long term (current) use of oral hypoglycemic drugs: Secondary | ICD-10-CM | POA: Diagnosis not present

## 2023-09-11 LAB — CBC WITH DIFFERENTIAL/PLATELET
Abs Immature Granulocytes: 0.02 K/uL (ref 0.00–0.07)
Basophils Absolute: 0 K/uL (ref 0.0–0.1)
Basophils Relative: 1 %
Eosinophils Absolute: 0.2 K/uL (ref 0.0–0.5)
Eosinophils Relative: 3 %
HCT: 45.3 % (ref 36.0–46.0)
Hemoglobin: 14.5 g/dL (ref 12.0–15.0)
Immature Granulocytes: 0 %
Lymphocytes Relative: 41 %
Lymphs Abs: 2.3 K/uL (ref 0.7–4.0)
MCH: 23.1 pg — ABNORMAL LOW (ref 26.0–34.0)
MCHC: 32 g/dL (ref 30.0–36.0)
MCV: 72 fL — ABNORMAL LOW (ref 80.0–100.0)
Monocytes Absolute: 0.6 K/uL (ref 0.1–1.0)
Monocytes Relative: 10 %
Neutro Abs: 2.5 K/uL (ref 1.7–7.7)
Neutrophils Relative %: 45 %
Platelets: 358 K/uL (ref 150–400)
RBC: 6.29 MIL/uL — ABNORMAL HIGH (ref 3.87–5.11)
RDW: 17.6 % — ABNORMAL HIGH (ref 11.5–15.5)
WBC: 5.7 K/uL (ref 4.0–10.5)
nRBC: 0 % (ref 0.0–0.2)

## 2023-09-11 LAB — FERRITIN: Ferritin: 28 ng/mL (ref 11–307)

## 2023-09-11 LAB — RETIC PANEL
Immature Retic Fract: 12.5 % (ref 2.3–15.9)
RBC.: 6.28 MIL/uL — ABNORMAL HIGH (ref 3.87–5.11)
Retic Count, Absolute: 119.3 K/uL (ref 19.0–186.0)
Retic Ct Pct: 1.9 % (ref 0.4–3.1)
Reticulocyte Hemoglobin: 24.7 pg — ABNORMAL LOW (ref >27.9)

## 2023-09-11 LAB — IRON AND TIBC
Iron: 35 ug/dL (ref 28–170)
Saturation Ratios: 10 % — ABNORMAL LOW (ref 10.4–31.8)
TIBC: 363 ug/dL (ref 250–450)
UIBC: 328 ug/dL

## 2023-09-11 NOTE — Assessment & Plan Note (Signed)
 Lab Results  Component Value Date   HGB 14.5 09/11/2023   TIBC 363 09/11/2023   IRONPCTSAT 10 (L) 09/11/2023   FERRITIN 28 09/11/2023    Today's iron  panel shows slightly decreased oral iron  saturation 10.  Hemoglobin is normal. Recommend patient to start oral iron  supplementation

## 2023-09-11 NOTE — Progress Notes (Signed)
 Hematology/Oncology Consult Note [phone] [phone]     REFERRING PROVIDER: Donal Channing SQUIBB, FNP    CHIEF COMPLAINTS/PURPOSE OF CONSULTATION:  Iron  deficiency anemia. ASSESSMENT & PLAN:   Iron  deficiency anemia Lab Results  Component Value Date   HGB 14.5 09/11/2023   TIBC 363 09/11/2023   IRONPCTSAT 10 (L) 09/11/2023   FERRITIN 28 09/11/2023    Today's iron  panel shows slightly decreased oral iron  saturation 10.  Hemoglobin is normal. Recommend patient to start oral iron  supplementation   Orders Placed This Encounter  Procedures   Ferritin    Standing Status:   Future    Number of Occurrences:   1    Expected Date:   09/11/2023    Expiration Date:   12/10/2023   Iron  and TIBC    Standing Status:   Future    Number of Occurrences:   1    Expected Date:   09/11/2023    Expiration Date:   12/10/2023   CBC with Differential/Platelet    Standing Status:   Future    Number of Occurrences:   1    Expected Date:   09/11/2023    Expiration Date:   12/10/2023   Retic Panel    Standing Status:   Future    Number of Occurrences:   1    Expected Date:   09/11/2023    Expiration Date:   12/10/2023   Ambulatory referral to Social Work    Referral Priority:   Routine    Referral Type:   Consultation    Referral Reason:   Specialty Services Required    Number of Visits Requested:   1   Follow-up in 6 months. All questions were answered. The patient knows to call the clinic with any problems, questions or concerns.  Zelphia Cap, MD, PhD [facility] Hematology Oncology 09/11/2023    HISTORY OF PRESENTING ILLNESS:  [name] 48 y.o. female presents to establish care for iron  deficiency anemia. Patient recently had blood work done which showed iron  saturation of 12.  Hemoglobin 13.6.  Patient has a history of iron  deficiency anemia. Patient denies hematochezia, hematemesis, rectal bleeding, melena. He takes aspirin  81 mg daily. History of  gastritis, patient takes Dexilant. Hyperthyroidism on PTU.  MEDICAL HISTORY:  Past Medical History:  Diagnosis Date   Anemia    Bipolar 1 disorder (HCC)    Fibromyalgia    GERD (gastroesophageal reflux disease)    History of degenerative disc disease    Hypertension    Manic depression (HCC)    osteoarthritis    osteoarthritis   Sciatica     SURGICAL HISTORY: Past Surgical History:  Procedure Laterality Date   ANKLE SURGERY Left    ORIF   BACK SURGERY     x3   CHOLECYSTECTOMY  1998   Dr. Ivery   COLONOSCOPY WITH PROPOFOL  N/A 02/19/2019   Procedure: COLONOSCOPY WITH PROPOFOL ;  Surgeon: Toledo, Ladell POUR, MD;  Location: [facility];  Service: Gastroenterology;  Laterality: N/A;   ERCP  2005   Dr. Golda: s/p sphincerotomy with removal of sludge and debris   ESOPHAGOGASTRODUODENOSCOPY (EGD) WITH PROPOFOL  N/A 02/19/2019   Procedure: ESOPHAGOGASTRODUODENOSCOPY (EGD) WITH PROPOFOL ;  Surgeon: Toledo, Ladell POUR, MD;  Location: [facility];  Service: Gastroenterology;  Laterality: N/A;   HYSTEROSCOPY WITH D & C N/A 01/26/2018   Procedure: DILATATION AND CURETTAGE /HYSTEROSCOPY WITH MINERVA;  Surgeon: Connell Davies, MD;  Location: [facility] OR[facility];  Service: Gynecology;  Laterality: N/A;  LUMBAR WOUND DEBRIDEMENT N/A 08/16/2015   Procedure: Spinal Cord Stimulator Revision;  Surgeon: Rockey Peru, MD;  Location: [facility];  Service: Neurosurgery;  Laterality: N/A;   SPINAL CORD STIMULATOR INSERTION N/A 08/16/2015   Procedure: LUMBAR SPINAL CORD STIMULATOR INSERTION;  Surgeon: Rockey Peru, MD;  Location: [facility];  Service: Neurosurgery;  Laterality: N/A;  LUMBAR SPINAL CORD STIMULATOR INSERTION   TUBAL LIGATION     UMBILICAL HERNIA REPAIR  1995   [facility]    SOCIAL HISTORY: Social History   Socioeconomic History   Marital status: Married    Spouse name: Not on file   Number of children: 2   Years of education: Not on file   Highest education level: Not on file   Occupational History   Occupation: disabled  Tobacco Use   Smoking status: Every Day    Current packs/day: 1.00    Average packs/day: 1 pack/day for 20.0 years (20.0 ttl pk-yrs)    Types: Cigarettes   Smokeless tobacco: Never  Vaping Use   Vaping status: Never Used  Substance and Sexual Activity   Alcohol use: No   Drug use: Yes    Types: Marijuana    Comment: none since 07/2016   Sexual activity: Yes    Birth control/protection: Surgical  Other Topics Concern   Not on file  Social History Narrative   Not on file   Social Drivers of Health   Financial Resource Strain: Not on file  Food Insecurity: Food Insecurity Present (09/11/2023)   Hunger Vital Sign    Worried About Running Out of Food in the Last Year: Often true    Ran Out of Food in the Last Year: Often true  Transportation Needs: No Transportation Needs (09/11/2023)   PRAPARE - Administrator, Civil Service (Medical): No    Lack of Transportation (Non-Medical): No  Physical Activity: Not on file  Stress: Not on file  Social Connections: Not on file  Intimate Partner Violence: Not At Risk (09/11/2023)   Humiliation, Afraid, Rape, and Kick questionnaire    Fear of Current or Ex-Partner: No    Emotionally Abused: No    Physically Abused: No    Sexually Abused: No    FAMILY HISTORY: Family History  Problem Relation Age of Onset   Crohn's disease Maternal Aunt    Rheum arthritis Mother    Diabetes Mother    Clotting disorder Mother    Breast cancer Mother 27   Cancer Mother 30       breast (may have been precancerous)   Colon cancer Neg Hx     ALLERGIES:  is allergic to vicodin [hydrocodone -acetaminophen ], acetaminophen , hydrocil [psyllium], and percocet [oxycodone -acetaminophen ].  MEDICATIONS:  Current Outpatient Medications  Medication Sig Dispense Refill   albuterol  (VENTOLIN  HFA) 108 (90 Base) MCG/ACT inhaler Inhale 2 puffs into the lungs every 4 (four) hours as needed for wheezing or  shortness of breath.     amitriptyline  (ELAVIL ) 150 MG tablet Take 1 tablet by mouth at bedtime.     aspirin  EC 81 MG tablet Take 81 mg by mouth daily. Swallow whole.     Bempedoic Acid-Ezetimibe (NEXLIZET) 180-10 MG TABS Take 1 tablet by mouth daily.     Buprenorphine  HCl-Naloxone  HCl 8-2 MG FILM Place 1 Film under the tongue 3 (three) times daily.     cariprazine (VRAYLAR) 1.5 MG capsule Take 1.5 mg by mouth daily.     chlorthalidone (HYGROTON) 25 MG tablet Take 25  mg by mouth daily.     ciclopirox (PENLAC) 8 % solution Apply topically at bedtime. Apply over nail and surrounding skin. Apply daily over previous coat. After seven (7) days, may remove with alcohol and continue cycle.     DEXILANT 60 MG capsule Take 60 mg by mouth daily.     Doxepin HCl 6 MG TABS Take 1 tablet by mouth at bedtime as needed.     FARXIGA 10 MG TABS tablet Take 10 mg by mouth daily.     gabapentin  (NEURONTIN ) 600 MG tablet Take 600 mg by mouth 2 (two) times daily.      lisinopril  (ZESTRIL ) 20 MG tablet Take 20 mg by mouth daily.     nystatin powder Apply 1 Application topically 3 (three) times daily.     propylthiouracil (PTU) 50 MG tablet Take 50 mg by mouth 3 (three) times daily.     rosuvastatin (CRESTOR) 40 MG tablet Take 40 mg by mouth daily.     tirzepatide (MOUNJARO) 15 MG/0.5ML Pen Inject 15 mg into the skin once a week.     traZODone (DESYREL) 50 MG tablet Take 50 mg by mouth at bedtime as needed for sleep.     umeclidinium-vilanterol (ANORO ELLIPTA) 62.5-25 MCG/INH AEPB Inhale 1 puff into the lungs daily.     ALPRAZolam  (XANAX ) 1 MG tablet Take 1 mg by mouth 3 (three) times daily as needed for anxiety. (Patient not taking: Reported on 09/11/2023)     No current facility-administered medications for this visit.   Facility-Administered Medications Ordered in Other Visits  Medication Dose Route Frequency Provider Last Rate Last Admin   ondansetron  (ZOFRAN ) 4 mg in sodium chloride  0.9 % 50 mL IVPB  4 mg  Intravenous Q6H PRN Gillie Duncans, MD        Review of Systems  Constitutional:  Negative for appetite change, chills, fatigue and fever.  HENT:   Negative for hearing loss and voice change.   Eyes:  Negative for eye problems.  Respiratory:  Negative for chest tightness and cough.   Cardiovascular:  Negative for chest pain.  Gastrointestinal:  Negative for abdominal distention, abdominal pain and blood in stool.  Endocrine: Negative for hot flashes.  Genitourinary:  Negative for difficulty urinating and frequency.   Musculoskeletal:  Positive for arthralgias.  Skin:  Negative for itching and rash.  Neurological:  Negative for extremity weakness.  Hematological:  Negative for adenopathy.  Psychiatric/Behavioral:  Negative for confusion.      PHYSICAL EXAMINATION:   Vitals:   09/11/23 1126  BP: 130/82  Pulse: (!) 103  Resp: 18  Temp: (!) 97 F (36.1 C)  SpO2: 100%   Filed Weights   09/11/23 1126  Weight: 246 lb (111.6 kg)    Physical Exam Constitutional:      General: She is not in acute distress.    Appearance: She is not diaphoretic.  HENT:     Head: Normocephalic and atraumatic.     Mouth/Throat:     Pharynx: No oropharyngeal exudate.  Eyes:     General: No scleral icterus.    Pupils: Pupils are equal, round, and reactive to light.  Cardiovascular:     Rate and Rhythm: Normal rate and regular rhythm.     Heart sounds: No murmur heard. Pulmonary:     Effort: Pulmonary effort is normal. No respiratory distress.  Abdominal:     General: There is no distension.     Palpations: Abdomen is soft.  Tenderness: There is no abdominal tenderness.  Musculoskeletal:        General: Normal range of motion.     Cervical back: Normal range of motion and neck supple.  Skin:    General: Skin is warm and dry.     Findings: No erythema.  Neurological:     Mental Status: She is alert and oriented to person, place, and time.     Cranial Nerves: No cranial nerve deficit.      Motor: No abnormal muscle tone.     Coordination: Coordination normal.  Psychiatric:        Mood and Affect: Affect normal.      LABORATORY DATA:  I have reviewed the data as listed    Latest Ref Rng & Units 09/11/2023   11:43 AM 11/02/2021    5:25 PM 10/31/2021    7:06 PM  CBC  WBC 4.0 - 10.5 K/uL 5.7  8.4  9.0   Hemoglobin 12.0 - 15.0 g/dL 85.4  85.7  85.6   Hematocrit 36.0 - 46.0 % 45.3  44.3  45.8   Platelets 150 - 400 K/uL 358  334  370       Latest Ref Rng & Units 11/02/2021    5:25 PM 10/31/2021    7:49 PM 08/12/2020   12:16 AM  CMP  Glucose 70 - 99 mg/dL 894  895  92   BUN 6 - 20 mg/dL 14  17  16    Creatinine 0.44 - 1.00 mg/dL 9.20  9.14  9.21   Sodium 135 - 145 mmol/L 135  139  137   Potassium 3.5 - 5.1 mmol/L 3.4  3.2  3.2   Chloride 98 - 111 mmol/L 99  101  102   CO2 22 - 32 mmol/L 29  29  28    Calcium 8.9 - 10.3 mg/dL 9.0  9.2  8.7   Total Protein 6.5 - 8.1 g/dL 7.2  7.7    Total Bilirubin 0.3 - 1.2 mg/dL 0.4  0.3    Alkaline Phos 38 - 126 U/L 72  70    AST 15 - 41 U/L 20  22    ALT 0 - 44 U/L 22  23       RADIOGRAPHIC STUDIES: I have personally reviewed the radiological images as listed and agreed with the findings in the report. No results found.

## 2023-09-11 NOTE — Progress Notes (Signed)
 Patient is here due to her iron , but she didn't even know that she was supposed to be here. She was a patient here back in 2023. She is feeling very fatigue and tired especially over the past 2 weeks. She told me that before she would get the iron  infusions and they would help her with the fatigue.   She said that  she just had her labs done on 08/28/2023 at her PCP.

## 2023-09-12 ENCOUNTER — Encounter: Payer: Self-pay | Admitting: Oncology

## 2023-09-12 ENCOUNTER — Inpatient Hospital Stay

## 2023-09-12 NOTE — Progress Notes (Signed)
 [facility] Clinical Social Work  Clinical Social Work was referred by medical provider for need for community resources.  Clinical Social Worker contacted patient by phone to offer support and assess for needs.    Interventions: Referred patient to community resources: mailed food pantry locations and [facility] resources, including assistance with rent.  Patient is not eligible for the [facility].       Follow Up Plan:  Patient will contact CSW with any support or resource needs    Macario CHRISTELLA Au, LCSW  Clinical Social Worker [facility]        Patient is participating in a Managed Medicaid Plan:  Yes

## 2023-09-15 NOTE — Telephone Encounter (Signed)
-----   Message from Zelphia Cap sent at 09/11/2023  7:42 PM EDT ----- Please let patient know that her red blood cell count is normal.  Iron  panel showed slightly decreased iron  saturation, indicating early iron  deficiency.  Recommend patient to take over-the-counter  iron  supplementation. Vitron C or Slow Fe 1 tablet daily. Patient to follow-up in 6 months, labs prior to MD.  Labs are ordered. ----- Message ----- From: Rebecka, Lab In [place] Sent: 09/11/2023  11:53 AM EDT To: Zelphia Cap, MD

## 2023-09-15 NOTE — Telephone Encounter (Signed)
 Spoke to pt and informed her of results and MD recommendation. Pt verbalized understanding.   Pt would like a call to set up follow up appts:   6 months: labs prior to MD

## 2023-09-16 ENCOUNTER — Ambulatory Visit (INDEPENDENT_AMBULATORY_CARE_PROVIDER_SITE_OTHER): Admitting: Podiatry

## 2023-09-16 DIAGNOSIS — L853 Xerosis cutis: Secondary | ICD-10-CM | POA: Diagnosis not present

## 2023-09-16 DIAGNOSIS — M722 Plantar fascial fibromatosis: Secondary | ICD-10-CM | POA: Diagnosis not present

## 2023-09-16 MED ORDER — AMMONIUM LACTATE 12 % EX LOTN: 1.0000 | TOPICAL_LOTION | CUTANEOUS | 0 refills | Status: AC | PRN

## 2023-09-16 NOTE — Progress Notes (Signed)
 Subjective:  Patient ID: [name], female    DOB: Jun 17, 1975,  MRN: [id number]  Chief Complaint  Patient presents with   Diabetes    Foot pain    48 y.o. female presents with the above complaint.  Patient presents with bilateral heel pain that has been going on for quite some time is progressive gotten worse worse with ambulation is with pressure patient would like to discuss treatment options for this pain scale 7 out of 10 dull achy in nature.  She would like to discuss treatment options for this denies any other acute issues.  She also wants to discuss treatment for dry skin.  She tried over-the-counter lotion none of which has helped she would like to discuss prescription lotion   Review of Systems: Negative except as noted in the HPI. Denies N/V/F/Ch.  Past Medical History:  Diagnosis Date   Anemia    Bipolar 1 disorder (HCC)    Fibromyalgia    GERD (gastroesophageal reflux disease)    History of degenerative disc disease    Hypertension    Manic depression (HCC)    osteoarthritis    osteoarthritis   Sciatica     Current Outpatient Medications:    ammonium lactate  (AMLACTIN DAILY) 12 % lotion, Apply 1 Application topically as needed., Disp: 400 g, Rfl: 0   albuterol  (VENTOLIN  HFA) 108 (90 Base) MCG/ACT inhaler, Inhale 2 puffs into the lungs every 4 (four) hours as needed for wheezing or shortness of breath., Disp: , Rfl:    ALPRAZolam  (XANAX ) 1 MG tablet, Take 1 mg by mouth 3 (three) times daily as needed for anxiety. (Patient not taking: Reported on 09/11/2023), Disp: , Rfl:    amitriptyline  (ELAVIL ) 150 MG tablet, Take 1 tablet by mouth at bedtime., Disp: , Rfl:    aspirin  EC 81 MG tablet, Take 81 mg by mouth daily. Swallow whole., Disp: , Rfl:    Bempedoic Acid-Ezetimibe (NEXLIZET) 180-10 MG TABS, Take 1 tablet by mouth daily., Disp: , Rfl:    Buprenorphine  HCl-Naloxone  HCl 8-2 MG FILM, Place 1 Film under the tongue 3 (three) times daily., Disp: , Rfl:     cariprazine (VRAYLAR) 1.5 MG capsule, Take 1.5 mg by mouth daily., Disp: , Rfl:    chlorthalidone (HYGROTON) 25 MG tablet, Take 25 mg by mouth daily., Disp: , Rfl:    ciclopirox (PENLAC) 8 % solution, Apply topically at bedtime. Apply over nail and surrounding skin. Apply daily over previous coat. After seven (7) days, may remove with alcohol and continue cycle., Disp: , Rfl:    DEXILANT 60 MG capsule, Take 60 mg by mouth daily., Disp: , Rfl:    Doxepin HCl 6 MG TABS, Take 1 tablet by mouth at bedtime as needed., Disp: , Rfl:    FARXIGA 10 MG TABS tablet, Take 10 mg by mouth daily., Disp: , Rfl:    gabapentin  (NEURONTIN ) 600 MG tablet, Take 600 mg by mouth 2 (two) times daily. , Disp: , Rfl:    lisinopril  (ZESTRIL ) 20 MG tablet, Take 20 mg by mouth daily., Disp: , Rfl:    nystatin powder, Apply 1 Application topically 3 (three) times daily., Disp: , Rfl:    propylthiouracil (PTU) 50 MG tablet, Take 50 mg by mouth 3 (three) times daily., Disp: , Rfl:    rosuvastatin (CRESTOR) 40 MG tablet, Take 40 mg by mouth daily., Disp: , Rfl:    tirzepatide (MOUNJARO) 15 MG/0.5ML Pen, Inject 15 mg into the skin once a week., Disp: ,  Rfl:    traZODone (DESYREL) 50 MG tablet, Take 50 mg by mouth at bedtime as needed for sleep., Disp: , Rfl:    umeclidinium-vilanterol (ANORO ELLIPTA) 62.5-25 MCG/INH AEPB, Inhale 1 puff into the lungs daily., Disp: , Rfl:  No current facility-administered medications for this visit.  Facility-Administered Medications Ordered in Other Visits:    ondansetron  (ZOFRAN ) 4 mg in sodium chloride  0.9 % 50 mL IVPB, 4 mg, Intravenous, Q6H PRN, Cabbell, Kyle, MD  Social History   Tobacco Use  Smoking Status Every Day   Current packs/day: 1.00   Average packs/day: 1 pack/day for 20.0 years (20.0 ttl pk-yrs)   Types: Cigarettes  Smokeless Tobacco Never    Allergies  Allergen Reactions   Vicodin [Hydrocodone -Acetaminophen ] Nausea And Vomiting   Acetaminophen  Nausea Only   Hydrocil  [Psyllium] Nausea Only   Percocet [Oxycodone -Acetaminophen ] Nausea Only   Objective:  There were no vitals filed for this visit. There is no height or weight on file to calculate BMI. Constitutional Well developed. Well nourished.  Vascular Dorsalis pedis pulses palpable bilaterally. Posterior tibial pulses palpable bilaterally. Capillary refill normal to all digits.  No cyanosis or clubbing noted. Pedal hair growth normal.  Neurologic Normal speech. Oriented to person, place, and time. Epicritic sensation to light touch grossly present bilaterally.  Dermatologic Nails well groomed and normal in appearance. No open wounds. No skin lesions.  Orthopedic: Normal joint ROM without pain or crepitus bilaterally. No visible deformities. Tender to palpation at the calcaneal tuber bilaterally. No pain with calcaneal squeeze bilaterally. Ankle ROM diminished range of motion bilaterally. Silfverskiold Test: positive bilaterally.   Radiographs: None  Assessment:   1. Plantar fasciitis of right foot   2. Plantar fasciitis of left foot   3. Xerosis of skin    Plan:  Patient was evaluated and treated and all questions answered.  Plantar Fasciitis, bilaterally - XR reviewed as above.  - Educated on icing and stretching. Instructions given.  - Injection delivered to the plantar fascia as below. - DME: Plantar fascial brace dispensed to support the medial longitudinal arch of the foot and offload pressure from the heel and prevent arch collapse during weightbearing - Pharmacologic management: None  Xerosis skin -I explained to the patient the etiology of xerosis and various treatment options were extensively discussed.  I explained to the patient the importance of maintaining moisturization of the skin with application of over-the-counter lotion such as Eucerin or Luciderm.  I have asked the patient to apply this twice a day.  Given that patient has failed over-the-counter prescription  lotion should benefit from ammonium lactate  to be applied twice a day   Procedure: Injection Tendon/Ligament Location: Bilateral plantar fascia at the glabrous junction; medial approach. Skin Prep: alcohol Injectate: 0.5 cc 0.5% marcaine  plain, 0.5 cc of 1% Lidocaine , 0.5 cc kenalog 10. Disposition: Patient tolerated procedure well. Injection site dressed with a band-aid.  No follow-ups on file.

## 2023-10-07 ENCOUNTER — Encounter: Payer: Self-pay | Admitting: Oncology

## 2023-10-14 ENCOUNTER — Ambulatory Visit (INDEPENDENT_AMBULATORY_CARE_PROVIDER_SITE_OTHER): Admitting: Podiatry

## 2023-10-14 DIAGNOSIS — M722 Plantar fascial fibromatosis: Secondary | ICD-10-CM | POA: Diagnosis not present

## 2023-10-14 DIAGNOSIS — B353 Tinea pedis: Secondary | ICD-10-CM

## 2023-10-14 MED ORDER — CLOTRIMAZOLE-BETAMETHASONE 1-0.05 % EX CREA: 1.0000 | TOPICAL_CREAM | Freq: Every day | CUTANEOUS | 0 refills | Status: AC

## 2023-10-14 NOTE — Progress Notes (Signed)
 Subjective:  Patient ID: [name], female    DOB: [birth year],  MRN: [id number]  Chief Complaint  Patient presents with   Plantar Fasciitis    Pt stated that she is doing better     48 y.o. female presents with the above complaint.  Patient presents for follow-up of bilateral plantar fasciitis she is here to do another round of injection as the previous injection helped considerably.  She would also like to discuss treatment options for athlete's foot  Review of Systems: Negative except as noted in the HPI. Denies N/V/F/Ch.  Past Medical History:  Diagnosis Date   Anemia    Bipolar 1 disorder (HCC)    Fibromyalgia    GERD (gastroesophageal reflux disease)    History of degenerative disc disease    Hypertension    Manic depression (HCC)    osteoarthritis    osteoarthritis   Sciatica     Current Outpatient Medications:    clotrimazole -betamethasone  (LOTRISONE ) cream, Apply 1 Application topically daily., Disp: 45 g, Rfl: 0   albuterol  (VENTOLIN  HFA) 108 (90 Base) MCG/ACT inhaler, Inhale 2 puffs into the lungs every 4 (four) hours as needed for wheezing or shortness of breath., Disp: , Rfl:    ALPRAZolam  (XANAX ) 1 MG tablet, Take 1 mg by mouth 3 (three) times daily as needed for anxiety. (Patient not taking: Reported on 09/11/2023), Disp: , Rfl:    amitriptyline  (ELAVIL ) 150 MG tablet, Take 1 tablet by mouth at bedtime., Disp: , Rfl:    ammonium lactate  (AMLACTIN DAILY) 12 % lotion, Apply 1 Application topically as needed., Disp: 400 g, Rfl: 0   aspirin  EC 81 MG tablet, Take 81 mg by mouth daily. Swallow whole., Disp: , Rfl:    Bempedoic Acid-Ezetimibe (NEXLIZET) 180-10 MG TABS, Take 1 tablet by mouth daily., Disp: , Rfl:    Buprenorphine  HCl-Naloxone  HCl 8-2 MG FILM, Place 1 Film under the tongue 3 (three) times daily., Disp: , Rfl:    cariprazine (VRAYLAR) 1.5 MG capsule, Take 1.5 mg by mouth daily., Disp: , Rfl:    chlorthalidone (HYGROTON) 25 MG tablet, Take 25 mg by  mouth daily., Disp: , Rfl:    ciclopirox (PENLAC) 8 % solution, Apply topically at bedtime. Apply over nail and surrounding skin. Apply daily over previous coat. After seven (7) days, may remove with alcohol and continue cycle., Disp: , Rfl:    DEXILANT 60 MG capsule, Take 60 mg by mouth daily., Disp: , Rfl:    Doxepin HCl 6 MG TABS, Take 1 tablet by mouth at bedtime as needed., Disp: , Rfl:    FARXIGA 10 MG TABS tablet, Take 10 mg by mouth daily., Disp: , Rfl:    gabapentin  (NEURONTIN ) 600 MG tablet, Take 600 mg by mouth 2 (two) times daily. , Disp: , Rfl:    lisinopril  (ZESTRIL ) 20 MG tablet, Take 20 mg by mouth daily., Disp: , Rfl:    nystatin powder, Apply 1 Application topically 3 (three) times daily., Disp: , Rfl:    propylthiouracil (PTU) 50 MG tablet, Take 50 mg by mouth 3 (three) times daily., Disp: , Rfl:    rosuvastatin (CRESTOR) 40 MG tablet, Take 40 mg by mouth daily., Disp: , Rfl:    tirzepatide (MOUNJARO) 15 MG/0.5ML Pen, Inject 15 mg into the skin once a week., Disp: , Rfl:    traZODone (DESYREL) 50 MG tablet, Take 50 mg by mouth at bedtime as needed for sleep., Disp: , Rfl:    umeclidinium-vilanterol (ANORO ELLIPTA)  62.5-25 MCG/INH AEPB, Inhale 1 puff into the lungs daily., Disp: , Rfl:  No current facility-administered medications for this visit.  Facility-Administered Medications Ordered in Other Visits:    ondansetron  (ZOFRAN ) 4 mg in sodium chloride  0.9 % 50 mL IVPB, 4 mg, Intravenous, Q6H PRN, Cabbell, Kyle, MD  Social History   Tobacco Use  Smoking Status Every Day   Current packs/day: 1.00   Average packs/day: 1 pack/day for 20.0 years (20.0 ttl pk-yrs)   Types: Cigarettes  Smokeless Tobacco Never    Allergies  Allergen Reactions   Vicodin [Hydrocodone -Acetaminophen ] Nausea And Vomiting   Acetaminophen  Nausea Only   Hydrocil [Psyllium] Nausea Only   Percocet [Oxycodone -Acetaminophen ] Nausea Only   Objective:  There were no vitals filed for this  visit. There is no height or weight on file to calculate BMI. Constitutional Well developed. Well nourished.  Vascular Dorsalis pedis pulses palpable bilaterally. Posterior tibial pulses palpable bilaterally. Capillary refill normal to all digits.  No cyanosis or clubbing noted. Pedal hair growth normal.  Neurologic Normal speech. Oriented to person, place, and time. Epicritic sensation to light touch grossly present bilaterally.  Dermatologic Nails well groomed and normal in appearance. No open wounds. No skin lesions.  Orthopedic: Normal joint ROM without pain or crepitus bilaterally. No visible deformities. Tender to palpation at the calcaneal tuber bilaterally. No pain with calcaneal squeeze bilaterally. Ankle ROM diminished range of motion bilaterally. Silfverskiold Test: positive bilaterally.   Radiographs: None  Assessment:   1. Plantar fasciitis of right foot   2. Plantar fasciitis of left foot   3. Tinea pedis of both feet     Plan:  Patient was evaluated and treated and all questions answered.  Plantar Fasciitis, bilaterally - XR reviewed as above.  - Educated on icing and stretching. Instructions given.  - Second injection delivered to the plantar fascia as below. - DME: Plantar fascial brace dispensed to support the medial longitudinal arch of the foot and offload pressure from the heel and prevent arch collapse during weightbearing - Pharmacologic management: None  Xerosis skin/athlete's foot - Ammonium lactate  did not help much.  She would like to do antifungal treatment options.  For which I believe she would benefit from Lotrisone  cream which was sent to her pharmacy she states understanding   Procedure: Injection Tendon/Ligament Location: Bilateral plantar fascia at the glabrous junction; medial approach. Skin Prep: alcohol Injectate: 0.5 cc 0.5% marcaine  plain, 0.5 cc of 1% Lidocaine , 0.5 cc kenalog 10. Disposition: Patient tolerated procedure well.  Injection site dressed with a band-aid.  Return in about 3 months (around 01/13/2024) for RFC .

## 2023-11-17 ENCOUNTER — Ambulatory Visit: Admitting: Podiatry

## 2023-12-15 ENCOUNTER — Encounter: Payer: Self-pay | Admitting: Oncology

## 2023-12-19 ENCOUNTER — Emergency Department
Admission: EM | Admit: 2023-12-19 | Discharge: 2023-12-19 | Disposition: A | Discharge status: Home / Self Care | Attending: Emergency Medicine | Admitting: Emergency Medicine

## 2023-12-19 ENCOUNTER — Emergency Department

## 2023-12-19 ENCOUNTER — Other Ambulatory Visit: Payer: Self-pay

## 2023-12-19 DIAGNOSIS — I10 Essential (primary) hypertension: Secondary | ICD-10-CM | POA: Diagnosis not present

## 2023-12-19 DIAGNOSIS — R600 Localized edema: Secondary | ICD-10-CM | POA: Insufficient documentation

## 2023-12-19 DIAGNOSIS — M7989 Other specified soft tissue disorders: Secondary | ICD-10-CM | POA: Diagnosis present

## 2023-12-19 LAB — BASIC METABOLIC PANEL WITH GFR
Anion gap: 10 (ref 5–15)
BUN: 14 mg/dL (ref 6–20)
CO2: 29 mmol/L (ref 22–32)
Calcium: 9.5 mg/dL (ref 8.9–10.3)
Chloride: 101 mmol/L (ref 98–111)
Creatinine, Ser: 0.63 mg/dL (ref 0.44–1.00)
GFR, Estimated: >60 mL/min (ref >60)
Glucose, Bld: 149 mg/dL — ABNORMAL HIGH (ref 70–99)
Potassium: 3.2 mmol/L — ABNORMAL LOW (ref 3.5–5.1)
Sodium: 140 mmol/L (ref 135–145)

## 2023-12-19 LAB — CBC
HCT: 43.4 % (ref 36.0–46.0)
Hemoglobin: 13.5 g/dL (ref 12.0–15.0)
MCH: 22.5 pg — ABNORMAL LOW (ref 26.0–34.0)
MCHC: 31.1 g/dL (ref 30.0–36.0)
MCV: 72.2 fL — ABNORMAL LOW (ref 80.0–100.0)
Platelets: 347 K/uL (ref 150–400)
RBC: 6.01 MIL/uL — ABNORMAL HIGH (ref 3.87–5.11)
RDW: 15.7 % — ABNORMAL HIGH (ref 11.5–15.5)
WBC: 6.8 K/uL (ref 4.0–10.5)
nRBC: 0 % (ref 0.0–0.2)

## 2023-12-19 LAB — PRO BRAIN NATRIURETIC PEPTIDE: Pro Brain Natriuretic Peptide: 178 pg/mL (ref <300.0)

## 2023-12-19 LAB — LACTIC ACID, PLASMA: Lactic Acid, Venous: 1.4 mmol/L (ref 0.5–1.9)

## 2023-12-19 MED ORDER — KETOROLAC TROMETHAMINE 15 MG/ML IJ SOLN
15.0000 mg | Freq: Once | INTRAMUSCULAR | Status: AC
  Administered 2023-12-19: 15 mg via INTRAMUSCULAR
  Filled 2023-12-19: qty 1

## 2023-12-19 NOTE — ED Provider Notes (Signed)
 [name] Provider Note    Event Date/Time   First MD Initiated Contact with Patient 12/19/23 1431     (approximate)   History   Leg Swelling   HPI  [name] is a 48 y.o. female with history of GERD, fibromyalgia, bipolar disorder, hypertension, presenting with leg swelling.  States that this is worse than previous, notes the right side is larger than the left, no shortness of breath or chest pain, no history of CHF.  States that she takes 20 mg of Lasix, took her dose today.  Denies any history of blood clots or malignancies, no recent travel or surgeries, no hormone use.  No weakness or numbness.  No trauma or falls.  On independent chart review, she had a left DVT ultrasound 2021 that was negative for acute DVT.     Physical Exam   Triage Vital Signs: ED Triage Vitals [12/19/23 1341]  Encounter Vitals Group     BP 136/71     Girls Systolic BP Percentile      Girls Diastolic BP Percentile      Boys Systolic BP Percentile      Boys Diastolic BP Percentile      Pulse Rate 91     Resp 18     Temp 98 F (36.7 C)     Temp src      SpO2 99 %     Weight 245 lb (111.1 kg)     Height 5' (1.524 m)     Head Circumference      Peak Flow      Pain Score 8     Pain Loc      Pain Education      Exclude from Growth Chart     Most recent vital signs: Vitals:   12/19/23 1341  BP: 136/71  Pulse: 91  Resp: 18  Temp: 98 F (36.7 C)  SpO2: 99%     General: Awake, no distress.  CV:  Good peripheral perfusion.  Resp:  Normal effort.  Abd:  No distention.  Other:  Bilateral lower extremity edema, right is greater than left, it appears to extend up to her knees.  Palpable DP pulses bilaterally, no focal weakness or numbness.  There is no fluctuance, no obvious erythema or warmth.   ED Results / Procedures / Treatments   Labs (all labs ordered are listed, but only abnormal results are displayed) Labs Reviewed  CBC - Abnormal; Notable  for the following components:      Result Value   RBC 6.01 (*)    MCV 72.2 (*)    MCH 22.5 (*)    RDW 15.7 (*)    All other components within normal limits  BASIC METABOLIC PANEL WITH GFR - Abnormal; Notable for the following components:   Potassium 3.2 (*)    Glucose, Bld 149 (*)    All other components within normal limits  LACTIC ACID, PLASMA  PRO BRAIN NATRIURETIC PEPTIDE      PROCEDURES:  Critical Care performed: No  Procedures   MEDICATIONS ORDERED IN ED: Medications  ketorolac  (TORADOL ) 15 MG/ML injection 15 mg (has no administration in time range)     IMPRESSION / MDM / ASSESSMENT AND PLAN / ED COURSE  I reviewed the triage vital signs and the nursing notes.  Differential diagnosis includes, but is not limited to, lymphedema, volume overload, DVT, did consider CHF but patient denies any chest pain shortness of breath.  Will get labs, BNP, DVT ultrasound.  Patient's presentation is most consistent with acute presentation with potential threat to life or bodily function.  Independent interpretation of labs below.  Patient signed out pending DVT ultrasound, if negative able to be discharged home.    Clinical Course as of 12/19/23 1522  Fri Dec 19, 2023  1512 Independent review of labs, lactic acid is not elevated, electrolytes not severely deranged, BNP is not elevated, no leukocytosis. [TT]    Clinical Course User Index [TT] Waymond, Lorelle Cummins, MD     FINAL CLINICAL IMPRESSION(S) / ED DIAGNOSES   Final diagnoses:  Bilateral lower extremity edema     Rx / DC Orders   ED Discharge Orders     None        Note:  This document was prepared using Dragon voice recognition software and may include unintentional dictation errors.    Waymond Lorelle Cummins, MD 12/19/23 [phone]

## 2023-12-19 NOTE — ED Provider Notes (Addendum)
 Ultrasound negative.  Will discharge patient.   Floy Roberts, MD 12/19/23 [phone]

## 2023-12-19 NOTE — ED Triage Notes (Signed)
 Pt comes with bilateral leg swelling and pain for week. Pt states hx of this and has fluid build up but it looks different this time. Pt has more swelling more to right leg. Pt states warmth and redness. Pt denies any injuries.   Pt does take fluid pill. Pt denies missing any doses.

## 2023-12-19 NOTE — Discharge Instructions (Addendum)
 Ultrasound did not show any blood clots today.  Please follow-up with your primary care.

## 2023-12-20 ENCOUNTER — Encounter: Payer: Self-pay | Admitting: Oncology

## 2023-12-25 ENCOUNTER — Ambulatory Visit: Admitting: Podiatry

## 2023-12-25 DIAGNOSIS — L600 Ingrowing nail: Secondary | ICD-10-CM

## 2023-12-25 NOTE — Progress Notes (Signed)
 Subjective:  Patient ID: [name], female    DOB: 09-12-1975,  MRN: [id number]  Chief Complaint  Patient presents with   Nail Problem    Right foot 3rd toe nail causing a lot of discomfort     48 y.o. female presents with the above complaint.  Patient presents for right third lateral border ingrown pain.  Painful to touch has progressive gotten worse worse with ambulation and shoe pressure causing a lot of discomfort pain scale is 5 or 10 dull aching nature she would like to have removed has not seen MRIs prior to seeing me.   Review of Systems: Negative except as noted in the HPI. Denies N/V/F/Ch.  Past Medical History:  Diagnosis Date   Anemia    Bipolar 1 disorder (HCC)    Fibromyalgia    GERD (gastroesophageal reflux disease)    History of degenerative disc disease    Hypertension    Manic depression (HCC)    osteoarthritis    osteoarthritis   Sciatica     Current Outpatient Medications:    albuterol  (VENTOLIN  HFA) 108 (90 Base) MCG/ACT inhaler, Inhale 2 puffs into the lungs every 4 (four) hours as needed for wheezing or shortness of breath., Disp: , Rfl:    ALPRAZolam  (XANAX ) 1 MG tablet, Take 1 mg by mouth 3 (three) times daily as needed for anxiety. (Patient not taking: Reported on 09/11/2023), Disp: , Rfl:    amitriptyline  (ELAVIL ) 150 MG tablet, Take 1 tablet by mouth at bedtime., Disp: , Rfl:    ammonium lactate  (AMLACTIN DAILY) 12 % lotion, Apply 1 Application topically as needed., Disp: 400 g, Rfl: 0   aspirin  EC 81 MG tablet, Take 81 mg by mouth daily. Swallow whole., Disp: , Rfl:    Bempedoic Acid-Ezetimibe (NEXLIZET) 180-10 MG TABS, Take 1 tablet by mouth daily., Disp: , Rfl:    Buprenorphine  HCl-Naloxone  HCl 8-2 MG FILM, Place 1 Film under the tongue 3 (three) times daily., Disp: , Rfl:    cariprazine (VRAYLAR) 1.5 MG capsule, Take 1.5 mg by mouth daily., Disp: , Rfl:    chlorthalidone (HYGROTON) 25 MG tablet, Take 25 mg by mouth daily., Disp: , Rfl:     ciclopirox (PENLAC) 8 % solution, Apply topically at bedtime. Apply over nail and surrounding skin. Apply daily over previous coat. After seven (7) days, may remove with alcohol and continue cycle., Disp: , Rfl:    clotrimazole -betamethasone  (LOTRISONE ) cream, Apply 1 Application topically daily., Disp: 45 g, Rfl: 0   DEXILANT 60 MG capsule, Take 60 mg by mouth daily., Disp: , Rfl:    Doxepin HCl 6 MG TABS, Take 1 tablet by mouth at bedtime as needed., Disp: , Rfl:    FARXIGA 10 MG TABS tablet, Take 10 mg by mouth daily., Disp: , Rfl:    gabapentin  (NEURONTIN ) 600 MG tablet, Take 600 mg by mouth 2 (two) times daily. , Disp: , Rfl:    lisinopril  (ZESTRIL ) 20 MG tablet, Take 20 mg by mouth daily., Disp: , Rfl:    nystatin powder, Apply 1 Application topically 3 (three) times daily., Disp: , Rfl:    propylthiouracil (PTU) 50 MG tablet, Take 50 mg by mouth 3 (three) times daily., Disp: , Rfl:    rosuvastatin (CRESTOR) 40 MG tablet, Take 40 mg by mouth daily., Disp: , Rfl:    tirzepatide (MOUNJARO) 15 MG/0.5ML Pen, Inject 15 mg into the skin once a week., Disp: , Rfl:    traZODone (DESYREL) 50 MG tablet,  Take 50 mg by mouth at bedtime as needed for sleep., Disp: , Rfl:    umeclidinium-vilanterol (ANORO ELLIPTA) 62.5-25 MCG/INH AEPB, Inhale 1 puff into the lungs daily., Disp: , Rfl:  No current facility-administered medications for this visit.  Facility-Administered Medications Ordered in Other Visits:    ondansetron  (ZOFRAN ) 4 mg in sodium chloride  0.9 % 50 mL IVPB, 4 mg, Intravenous, Q6H PRN, Cabbell, Kyle, MD  Social History   Tobacco Use  Smoking Status Every Day   Current packs/day: 1.00   Average packs/day: 1 pack/day for 20.0 years (20.0 ttl pk-yrs)   Types: Cigarettes  Smokeless Tobacco Never    Allergies  Allergen Reactions   Vicodin [Hydrocodone -Acetaminophen ] Nausea And Vomiting   Acetaminophen  Nausea Only   Hydrocil [Psyllium] Nausea Only   Percocet [Oxycodone -Acetaminophen ]  Nausea Only   Objective:  There were no vitals filed for this visit. There is no height or weight on file to calculate BMI. Constitutional Well developed. Well nourished.  Vascular Dorsalis pedis pulses palpable bilaterally. Posterior tibial pulses palpable bilaterally. Capillary refill normal to all digits.  No cyanosis or clubbing noted. Pedal hair growth normal.  Neurologic Normal speech. Oriented to person, place, and time. Epicritic sensation to light touch grossly present bilaterally.  Dermatologic Painful ingrowing nail at lateral nail borders of the right third nail right. No other open wounds. No skin lesions.  Orthopedic: Normal joint ROM without pain or crepitus bilaterally. No visible deformities. No bony tenderness.   Radiographs: None Assessment:   1. Ingrown nail of third toe of right foot    Plan:  Patient was evaluated and treated and all questions answered.  Ingrown Nail, right -Patient elects to proceed with minor surgery to remove ingrown toenail removal today. Consent reviewed and signed by patient. -Ingrown nail excised. See procedure note. -Educated on post-procedure care including soaking. Written instructions provided and reviewed. -Patient to follow up in 2 weeks for nail check.  Procedure: Excision of Ingrown Toenail Location: Right 3rd toe lateral nail borders. Anesthesia: Lidocaine  1% plain; 1.5 mL and Marcaine  0.5% plain; 1.5 mL, digital block. Skin Prep: Betadine. Dressing: Silvadene; telfa; dry, sterile, compression dressing. Technique: Following skin prep, the toe was exsanguinated and a tourniquet was secured at the base of the toe. The affected nail border was freed, split with a nail splitter, and excised. Chemical matrixectomy was then performed with phenol and irrigated out with alcohol. The tourniquet was then removed and sterile dressing applied. Disposition: Patient tolerated procedure well. Patient to return in 2 weeks for follow-up.    No follow-ups on file.

## 2024-02-09 ENCOUNTER — Inpatient Hospital Stay

## 2024-02-16 ENCOUNTER — Encounter: Payer: Self-pay | Admitting: Oncology

## 2024-02-18 ENCOUNTER — Inpatient Hospital Stay

## 2024-02-18 ENCOUNTER — Inpatient Hospital Stay: Attending: Oncology | Admitting: Oncology

## 2024-02-18 ENCOUNTER — Encounter: Payer: Self-pay | Admitting: Oncology

## 2024-02-18 VITALS — BP 132/73 | HR 54 | Temp 97.9°F | Resp 18 | Wt 246.8 lb

## 2024-02-18 DIAGNOSIS — E0590 Thyrotoxicosis, unspecified without thyrotoxic crisis or storm: Secondary | ICD-10-CM | POA: Insufficient documentation

## 2024-02-18 DIAGNOSIS — Z79899 Other long term (current) drug therapy: Secondary | ICD-10-CM | POA: Diagnosis not present

## 2024-02-18 DIAGNOSIS — Z7982 Long term (current) use of aspirin: Secondary | ICD-10-CM | POA: Diagnosis not present

## 2024-02-18 DIAGNOSIS — D509 Iron deficiency anemia, unspecified: Secondary | ICD-10-CM

## 2024-02-18 DIAGNOSIS — R718 Other abnormality of red blood cells: Secondary | ICD-10-CM | POA: Insufficient documentation

## 2024-02-18 DIAGNOSIS — Z8719 Personal history of other diseases of the digestive system: Secondary | ICD-10-CM | POA: Diagnosis not present

## 2024-02-18 DIAGNOSIS — Z803 Family history of malignant neoplasm of breast: Secondary | ICD-10-CM | POA: Insufficient documentation

## 2024-02-18 LAB — IRON AND TIBC
Iron: 58 ug/dL (ref 28–170)
Saturation Ratios: 15 % (ref 10.4–31.8)
TIBC: 381 ug/dL (ref 250–450)
UIBC: 323 ug/dL

## 2024-02-18 LAB — CBC WITH DIFFERENTIAL/PLATELET
Abs Immature Granulocytes: 0.03 10*3/uL (ref 0.00–0.07)
Basophils Absolute: 0 10*3/uL (ref 0.0–0.1)
Basophils Relative: 1 %
Eosinophils Absolute: 0.2 10*3/uL (ref 0.0–0.5)
Eosinophils Relative: 3 %
HCT: 47.4 % — ABNORMAL HIGH (ref 36.0–46.0)
Hemoglobin: 14.8 g/dL (ref 12.0–15.0)
Immature Granulocytes: 1 %
Lymphocytes Relative: 45 %
Lymphs Abs: 2.8 10*3/uL (ref 0.7–4.0)
MCH: 22 pg — ABNORMAL LOW (ref 26.0–34.0)
MCHC: 31.2 g/dL (ref 30.0–36.0)
MCV: 70.4 fL — ABNORMAL LOW (ref 80.0–100.0)
Monocytes Absolute: 0.5 10*3/uL (ref 0.1–1.0)
Monocytes Relative: 9 %
Neutro Abs: 2.5 10*3/uL (ref 1.7–7.7)
Neutrophils Relative %: 41 %
Platelets: 305 10*3/uL (ref 150–400)
RBC: 6.73 MIL/uL — ABNORMAL HIGH (ref 3.87–5.11)
RDW: 19 % — ABNORMAL HIGH (ref 11.5–15.5)
WBC: 6.1 10*3/uL (ref 4.0–10.5)
nRBC: 0 % (ref 0.0–0.2)

## 2024-02-18 LAB — RETIC PANEL
Immature Retic Fract: 13.7 % (ref 2.3–15.9)
RBC.: 6.67 MIL/uL — ABNORMAL HIGH (ref 3.87–5.11)
Retic Count, Absolute: 84 10*3/uL (ref 19.0–186.0)
Retic Ct Pct: 1.3 % (ref 0.4–3.1)
Reticulocyte Hemoglobin: 25.3 pg — ABNORMAL LOW

## 2024-02-18 LAB — FERRITIN: Ferritin: 34 ng/mL (ref 11–307)

## 2024-02-18 NOTE — Assessment & Plan Note (Signed)
 Chronic microcytosis despite normal iron  panel.  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 Will work up at next visit.

## 2024-02-18 NOTE — Progress Notes (Signed)
 " Hematology/Oncology Consult Note [phone] [phone]     REFERRING PROVIDER: Donal Channing SQUIBB, FNP    CHIEF COMPLAINTS/PURPOSE OF CONSULTATION:  Iron  deficiency anemia. ASSESSMENT & PLAN:   Iron  deficiency anemia Lab Results  Component Value Date   HGB 14.8 02/18/2024   TIBC 381 02/18/2024   IRONPCTSAT 15 02/18/2024   FERRITIN 34 02/18/2024    Hemoglobin is normal. Normal iron  level. No need for IV venofer  Recommend patient to stop oral iron  supplementation  Microcytosis Chronic microcytosis despite normal iron  panel.  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 Will work up at next visit.    Orders Placed This Encounter  Procedures   CBC with Differential (Cancer Center Only)    Standing Status:   Future    Expected Date:   02/17/2025    Expiration Date:   05/18/2025   CMP (Cancer Center only)    Standing Status:   Future    Expected Date:   02/17/2025    Expiration Date:   05/18/2025   Hgb Fractionation Cascade    Standing Status:   Future    Expected Date:   02/17/2025    Expiration Date:   05/18/2025   Iron  and TIBC    Standing Status:   Future    Expected Date:   02/17/2025    Expiration Date:   05/18/2025   Ferritin    Standing Status:   Future    Expected Date:   02/17/2025    Expiration Date:   05/18/2025   Retic Panel    Standing Status:   Future    Expected Date:   02/17/2025    Expiration Date:   05/18/2025   Alpha-Thalassemia Analysis    Standing Status:   Future    Expected Date:   02/17/2025    Expiration Date:   05/18/2025    Indication:   IDA   Follow-up in 12 months. All questions were answered. The patient knows to call the clinic with any problems, questions or concerns.  Zelphia Cap, MD, PhD [facility] Hematology Oncology 02/18/2024    HISTORY OF PRESENTING ILLNESS:  [name] 49 y.o. female presents to establish care for iron  deficiency anemia. Patient recently had blood work done which showed iron  saturation of 12.  Hemoglobin  13.6.  Patient has a history of iron  deficiency anemia. Patient denies hematochezia, hematemesis, rectal bleeding, melena. He takes aspirin  81 mg daily. History of gastritis, patient takes Dexilant. Hyperthyroidism on PTU.  INTERVAL HISTORY [name] is a 49 y.o. female who has above history reviewed by me today presents for follow up visit for iron  deficiency anemia.  She feels well. Take oral iron  supplementation.  No new complaints. Denies hematochezia, hematuria, hematemesis, epistaxis, black tarry stool or easy bruising.    MEDICAL HISTORY:  Past Medical History:  Diagnosis Date   Anemia    Bipolar 1 disorder (HCC)    Fibromyalgia    GERD (gastroesophageal reflux disease)    History of degenerative disc disease    Hypertension    Manic depression (HCC)    osteoarthritis    osteoarthritis   Sciatica     SURGICAL HISTORY: Past Surgical History:  Procedure Laterality Date   ANKLE SURGERY Left    ORIF   BACK SURGERY     x3   CHOLECYSTECTOMY  1998   Dr. Ivery   COLONOSCOPY WITH PROPOFOL  N/A 02/19/2019   Procedure: COLONOSCOPY WITH PROPOFOL ;  Surgeon: Toledo, Ladell POUR, MD;  Location: [facility];  Service:  Gastroenterology;  Laterality: N/A;   ERCP  2005   Dr. Golda: s/p sphincerotomy with removal of sludge and debris   ESOPHAGOGASTRODUODENOSCOPY (EGD) WITH PROPOFOL  N/A 02/19/2019   Procedure: ESOPHAGOGASTRODUODENOSCOPY (EGD) WITH PROPOFOL ;  Surgeon: Toledo, Ladell POUR, MD;  Location: [facility];  Service: Gastroenterology;  Laterality: N/A;   HYSTEROSCOPY WITH D & C N/A 01/26/2018   Procedure: DILATATION AND CURETTAGE /HYSTEROSCOPY WITH MINERVA;  Surgeon: Connell Davies, MD;  Location: [facility];  Service: Gynecology;  Laterality: N/A;   LUMBAR WOUND DEBRIDEMENT N/A 08/16/2015   Procedure: Spinal Cord Stimulator Revision;  Surgeon: Rockey Peru, MD;  Location: [facility];  Service: Neurosurgery;  Laterality: N/A;   SPINAL CORD STIMULATOR INSERTION N/A  08/16/2015   Procedure: LUMBAR SPINAL CORD STIMULATOR INSERTION;  Surgeon: Rockey Peru, MD;  Location: [facility];  Service: Neurosurgery;  Laterality: N/A;  LUMBAR SPINAL CORD STIMULATOR INSERTION   TUBAL LIGATION     UMBILICAL HERNIA REPAIR  1995   [facility]    SOCIAL HISTORY: Social History   Socioeconomic History   Marital status: Married    Spouse name: Not on file   Number of children: 2   Years of education: Not on file   Highest education level: Not on file  Occupational History   Occupation: disabled  Tobacco Use   Smoking status: Every Day    Current packs/day: 1.00    Average packs/day: 1 pack/day for 20.0 years (20.0 ttl pk-yrs)    Types: Cigarettes   Smokeless tobacco: Never  Vaping Use   Vaping status: Never Used  Substance and Sexual Activity   Alcohol use: No   Drug use: Yes    Types: Marijuana    Comment: none since 07/2016   Sexual activity: Yes    Birth control/protection: Surgical  Other Topics Concern   Not on file  Social History Narrative   Not on file   Social Drivers of Health   Tobacco Use: High Risk (02/18/2024)   Patient History    Smoking Tobacco Use: Every Day    Smokeless Tobacco Use: Never    Passive Exposure: Not on file  Financial Resource Strain: Not on file  Food Insecurity: Food Insecurity Present (09/11/2023)   Epic    Worried About Programme Researcher, Broadcasting/film/video in the Last Year: Often true    [employer] of Food in the Last Year: Often true  Transportation Needs: No Transportation Needs (09/11/2023)   Epic    Lack of Transportation (Medical): No    Lack of Transportation (Non-Medical): No  Physical Activity: Not on file  Stress: Not on file  Social Connections: Not on file  Intimate Partner Violence: Not At Risk (09/11/2023)   Epic    Fear of Current or Ex-Partner: No    Emotionally Abused: No    Physically Abused: No    Sexually Abused: No  Depression (PHQ2-9): Low Risk (09/11/2023)   Depression (PHQ2-9)    PHQ-2 Score: 0   Alcohol Screen: Low Risk (09/11/2023)   Alcohol Screen    Last Alcohol Screening Score (AUDIT): 0  Housing: High Risk (09/11/2023)   Epic    Unable to Pay for Housing in the Last Year: Yes    Number of Times Moved in the Last Year: 0    Homeless in the Last Year: No  Utilities: Not At Risk (09/11/2023)   Epic    Threatened with loss of utilities: No  Health Literacy: Not on file    FAMILY  HISTORY: Family History  Problem Relation Age of Onset   Crohn's disease Maternal Aunt    Rheum arthritis Mother    Diabetes Mother    Clotting disorder Mother    Breast cancer Mother 25   Cancer Mother 60       breast (may have been precancerous)   Colon cancer Neg Hx     ALLERGIES:  is allergic to vicodin [hydrocodone -acetaminophen ], acetaminophen , hydrocil [psyllium], and percocet [oxycodone -acetaminophen ].  MEDICATIONS:  Current Outpatient Medications  Medication Sig Dispense Refill   albuterol  (VENTOLIN  HFA) 108 (90 Base) MCG/ACT inhaler Inhale 2 puffs into the lungs every 4 (four) hours as needed for wheezing or shortness of breath.     amitriptyline  (ELAVIL ) 150 MG tablet Take 1 tablet by mouth at bedtime.     ammonium lactate  (AMLACTIN DAILY) 12 % lotion Apply 1 Application topically as needed. 400 g 0   aspirin  EC 81 MG tablet Take 81 mg by mouth daily. Swallow whole.     Bempedoic Acid-Ezetimibe (NEXLIZET) 180-10 MG TABS Take 1 tablet by mouth daily.     Buprenorphine  HCl-Naloxone  HCl 8-2 MG FILM Place 1 Film under the tongue 3 (three) times daily.     cariprazine (VRAYLAR) 1.5 MG capsule Take 1.5 mg by mouth daily.     chlorthalidone (HYGROTON) 25 MG tablet Take 25 mg by mouth daily.     ciclopirox (PENLAC) 8 % solution Apply topically at bedtime. Apply over nail and surrounding skin. Apply daily over previous coat. After seven (7) days, may remove with alcohol and continue cycle.     clotrimazole -betamethasone  (LOTRISONE ) cream Apply 1 Application topically daily. 45 g 0    DEXILANT 60 MG capsule Take 60 mg by mouth daily.     Doxepin HCl 6 MG TABS Take 1 tablet by mouth at bedtime as needed.     FARXIGA 10 MG TABS tablet Take 10 mg by mouth daily.     gabapentin  (NEURONTIN ) 600 MG tablet Take 600 mg by mouth 2 (two) times daily.      lisinopril  (ZESTRIL ) 20 MG tablet Take 20 mg by mouth daily.     nystatin powder Apply 1 Application topically 3 (three) times daily.     propylthiouracil (PTU) 50 MG tablet Take 50 mg by mouth 3 (three) times daily.     rosuvastatin (CRESTOR) 40 MG tablet Take 40 mg by mouth daily.     tirzepatide (MOUNJARO) 15 MG/0.5ML Pen Inject 15 mg into the skin once a week.     traZODone (DESYREL) 50 MG tablet Take 50 mg by mouth at bedtime as needed for sleep.     umeclidinium-vilanterol (ANORO ELLIPTA) 62.5-25 MCG/INH AEPB Inhale 1 puff into the lungs daily.     ALPRAZolam  (XANAX ) 1 MG tablet Take 1 mg by mouth 3 (three) times daily as needed for anxiety. (Patient not taking: Reported on 02/18/2024)     No current facility-administered medications for this visit.   Facility-Administered Medications Ordered in Other Visits  Medication Dose Route Frequency Provider Last Rate Last Admin   ondansetron  (ZOFRAN ) 4 mg in sodium chloride  0.9 % 50 mL IVPB  4 mg Intravenous Q6H PRN Gillie Duncans, MD        Review of Systems  Constitutional:  Negative for appetite change, chills, fatigue and fever.  HENT:   Negative for hearing loss and voice change.   Eyes:  Negative for eye problems.  Respiratory:  Negative for chest tightness and cough.   Cardiovascular:  Negative  for chest pain.  Gastrointestinal:  Negative for abdominal distention, abdominal pain and blood in stool.  Endocrine: Negative for hot flashes.  Genitourinary:  Negative for difficulty urinating and frequency.   Musculoskeletal:  Positive for arthralgias.  Skin:  Negative for itching and rash.  Neurological:  Negative for extremity weakness.  Hematological:  Negative for  adenopathy.  Psychiatric/Behavioral:  Negative for confusion.      PHYSICAL EXAMINATION:   Vitals:   02/18/24 1357  BP: 132/73  Pulse: (!) 54  Resp: 18  Temp: 97.9 F (36.6 C)  SpO2: 96%   Filed Weights   02/18/24 1357  Weight: 246 lb 12.8 oz (111.9 kg)    Physical Exam Constitutional:      General: She is not in acute distress. HENT:     Head: Normocephalic and atraumatic.  Eyes:     General: No scleral icterus. Cardiovascular:     Rate and Rhythm: Normal rate.  Pulmonary:     Effort: Pulmonary effort is normal. No respiratory distress.  Abdominal:     General: There is no distension.  Musculoskeletal:        General: Normal range of motion.  Neurological:     Mental Status: She is alert and oriented to person, place, and time. Mental status is at baseline.     Motor: No abnormal muscle tone.  Psychiatric:        Mood and Affect: Affect normal.      LABORATORY DATA:  I have reviewed the data as listed    Latest Ref Rng & Units 02/18/2024    1:15 PM 12/19/2023    2:08 PM 09/11/2023   11:43 AM  CBC  WBC 4.0 - 10.5 K/uL 6.1  6.8  5.7   Hemoglobin 12.0 - 15.0 g/dL 85.1  86.4  85.4   Hematocrit 36.0 - 46.0 % 47.4  43.4  45.3   Platelets 150 - 400 K/uL 305  347  358       Latest Ref Rng & Units 12/19/2023    2:08 PM 11/02/2021    5:25 PM 10/31/2021    7:49 PM  CMP  Glucose 70 - 99 mg/dL 850  894  895   BUN 6 - 20 mg/dL 14  14  17    Creatinine 0.44 - 1.00 mg/dL 9.36  9.20  9.14   Sodium 135 - 145 mmol/L 140  135  139   Potassium 3.5 - 5.1 mmol/L 3.2  3.4  3.2   Chloride 98 - 111 mmol/L 101  99  101   CO2 22 - 32 mmol/L 29  29  29    Calcium 8.9 - 10.3 mg/dL 9.5  9.0  9.2   Total Protein 6.5 - 8.1 g/dL  7.2  7.7   Total Bilirubin 0.3 - 1.2 mg/dL  0.4  0.3   Alkaline Phos 38 - 126 U/L  72  70   AST 15 - 41 U/L  20  22   ALT 0 - 44 U/L  22  23      RADIOGRAPHIC STUDIES: I have personally reviewed the radiological images as listed and agreed with  the findings in the report. No results found.  "

## 2024-02-18 NOTE — Assessment & Plan Note (Addendum)
 Lab Results  Component Value Date   HGB 14.8 02/18/2024   TIBC 381 02/18/2024   IRONPCTSAT 15 02/18/2024   FERRITIN 34 02/18/2024    Hemoglobin is normal. Normal iron  level. No need for IV venofer  Recommend patient to stop oral iron  supplementation

## 2024-03-10 ENCOUNTER — Other Ambulatory Visit

## 2024-03-17 ENCOUNTER — Ambulatory Visit: Admitting: Oncology

## 2025-02-11 ENCOUNTER — Inpatient Hospital Stay

## 2025-02-17 ENCOUNTER — Inpatient Hospital Stay

## 2025-02-17 ENCOUNTER — Inpatient Hospital Stay: Admitting: Oncology
